# Patient Record
Sex: Female | Born: 1960 | Race: White | Hispanic: No | Marital: Married | Smoking: Never smoker
Health system: Southern US, Community
[De-identification: ages and names within clinical notes are randomized; demographics above are authoritative.]

## PROBLEM LIST (undated history)

## (undated) DIAGNOSIS — D649 Anemia, unspecified: Secondary | ICD-10-CM

## (undated) DIAGNOSIS — J302 Other seasonal allergic rhinitis: Secondary | ICD-10-CM

## (undated) DIAGNOSIS — K219 Gastro-esophageal reflux disease without esophagitis: Secondary | ICD-10-CM

## (undated) DIAGNOSIS — I10 Essential (primary) hypertension: Secondary | ICD-10-CM

## (undated) DIAGNOSIS — C4491 Basal cell carcinoma of skin, unspecified: Secondary | ICD-10-CM

## (undated) DIAGNOSIS — M5412 Radiculopathy, cervical region: Secondary | ICD-10-CM

## (undated) DIAGNOSIS — E119 Type 2 diabetes mellitus without complications: Secondary | ICD-10-CM

## (undated) DIAGNOSIS — K222 Esophageal obstruction: Secondary | ICD-10-CM

## (undated) DIAGNOSIS — K449 Diaphragmatic hernia without obstruction or gangrene: Secondary | ICD-10-CM

## (undated) DIAGNOSIS — F419 Anxiety disorder, unspecified: Secondary | ICD-10-CM

## (undated) DIAGNOSIS — L309 Dermatitis, unspecified: Secondary | ICD-10-CM

## (undated) HISTORY — PX: WRIST SURGERY: SHX841

## (undated) HISTORY — PX: ANKLE SURGERY: SHX546

## (undated) HISTORY — DX: Gastro-esophageal reflux disease without esophagitis: K21.9

## (undated) HISTORY — PX: OTHER SURGICAL HISTORY: SHX169

## (undated) HISTORY — PX: CERVICAL FUSION: SHX112

## (undated) HISTORY — DX: Type 2 diabetes mellitus without complications: E11.9

## (undated) HISTORY — PX: PARTIAL HYSTERECTOMY: SHX80

## (undated) HISTORY — DX: Basal cell carcinoma of skin, unspecified: C44.91

## (undated) HISTORY — DX: Esophageal obstruction: K22.2

## (undated) HISTORY — DX: Dermatitis, unspecified: L30.9

## (undated) HISTORY — PX: TONSILLECTOMY AND ADENOIDECTOMY: SUR1326

## (undated) HISTORY — DX: Radiculopathy, cervical region: M54.12

## (undated) HISTORY — DX: Essential (primary) hypertension: I10

## (undated) HISTORY — DX: Diaphragmatic hernia without obstruction or gangrene: K44.9

## (undated) HISTORY — DX: Other seasonal allergic rhinitis: J30.2

## (undated) HISTORY — DX: Anxiety disorder, unspecified: F41.9

## (undated) HISTORY — DX: Anemia, unspecified: D64.9

---

## 1998-08-05 ENCOUNTER — Other Ambulatory Visit: Admission: RE | Admit: 1998-08-05 | Discharge: 1998-08-05 | Payer: Self-pay | Admitting: *Deleted

## 1998-09-01 ENCOUNTER — Inpatient Hospital Stay (HOSPITAL_COMMUNITY): Admission: EM | Admit: 1998-09-01 | Discharge: 1998-09-04 | Payer: Self-pay | Admitting: Emergency Medicine

## 1998-09-01 ENCOUNTER — Encounter: Payer: Self-pay | Admitting: Emergency Medicine

## 1998-09-02 ENCOUNTER — Encounter: Payer: Self-pay | Admitting: Orthopedic Surgery

## 1998-11-14 ENCOUNTER — Encounter: Admission: RE | Admit: 1998-11-14 | Discharge: 1999-01-27 | Payer: Self-pay | Admitting: Orthopedic Surgery

## 2000-08-23 ENCOUNTER — Ambulatory Visit (HOSPITAL_BASED_OUTPATIENT_CLINIC_OR_DEPARTMENT_OTHER): Admission: RE | Admit: 2000-08-23 | Discharge: 2000-08-23 | Payer: Self-pay | Admitting: Orthopedic Surgery

## 2002-10-07 ENCOUNTER — Other Ambulatory Visit: Admission: RE | Admit: 2002-10-07 | Discharge: 2002-10-07 | Payer: Self-pay | Admitting: Obstetrics and Gynecology

## 2003-11-17 ENCOUNTER — Other Ambulatory Visit: Admission: RE | Admit: 2003-11-17 | Discharge: 2003-11-17 | Payer: Self-pay | Admitting: Obstetrics and Gynecology

## 2004-01-07 ENCOUNTER — Encounter: Admission: RE | Admit: 2004-01-07 | Discharge: 2004-01-07 | Payer: Self-pay | Admitting: Obstetrics and Gynecology

## 2005-11-27 ENCOUNTER — Ambulatory Visit: Payer: Self-pay | Admitting: Internal Medicine

## 2005-12-10 HISTORY — PX: UPPER GASTROINTESTINAL ENDOSCOPY: SHX188

## 2006-01-08 ENCOUNTER — Ambulatory Visit: Payer: Self-pay | Admitting: Internal Medicine

## 2006-06-25 ENCOUNTER — Observation Stay (HOSPITAL_COMMUNITY): Admission: AD | Admit: 2006-06-25 | Discharge: 2006-06-27 | Payer: Self-pay | Admitting: Internal Medicine

## 2006-06-25 ENCOUNTER — Ambulatory Visit: Payer: Self-pay | Admitting: Internal Medicine

## 2006-06-26 ENCOUNTER — Encounter: Payer: Self-pay | Admitting: Internal Medicine

## 2006-07-03 ENCOUNTER — Ambulatory Visit: Payer: Self-pay | Admitting: Internal Medicine

## 2006-07-08 ENCOUNTER — Ambulatory Visit: Payer: Self-pay | Admitting: Cardiovascular Disease

## 2006-07-11 ENCOUNTER — Ambulatory Visit: Payer: Self-pay | Admitting: Cardiology

## 2006-07-12 ENCOUNTER — Ambulatory Visit: Payer: Self-pay | Admitting: Cardiology

## 2006-08-01 ENCOUNTER — Ambulatory Visit: Payer: Self-pay | Admitting: Internal Medicine

## 2006-08-05 ENCOUNTER — Ambulatory Visit: Payer: Self-pay | Admitting: Gastroenterology

## 2006-08-09 ENCOUNTER — Ambulatory Visit: Payer: Self-pay | Admitting: Internal Medicine

## 2006-08-13 ENCOUNTER — Ambulatory Visit: Payer: Self-pay | Admitting: Cardiology

## 2006-08-16 ENCOUNTER — Ambulatory Visit: Payer: Self-pay

## 2006-08-19 ENCOUNTER — Inpatient Hospital Stay (HOSPITAL_BASED_OUTPATIENT_CLINIC_OR_DEPARTMENT_OTHER): Admission: RE | Admit: 2006-08-19 | Discharge: 2006-08-19 | Payer: Self-pay | Admitting: Cardiovascular Disease

## 2006-08-19 ENCOUNTER — Ambulatory Visit: Payer: Self-pay | Admitting: Cardiovascular Disease

## 2006-08-29 ENCOUNTER — Ambulatory Visit: Payer: Self-pay | Admitting: Cardiology

## 2006-09-05 ENCOUNTER — Ambulatory Visit: Payer: Self-pay | Admitting: Cardiology

## 2006-09-16 ENCOUNTER — Ambulatory Visit: Payer: Self-pay | Admitting: Cardiology

## 2006-10-08 ENCOUNTER — Ambulatory Visit: Payer: Self-pay | Admitting: Cardiology

## 2006-10-08 ENCOUNTER — Ambulatory Visit: Payer: Self-pay

## 2007-03-20 ENCOUNTER — Ambulatory Visit: Payer: Self-pay | Admitting: Internal Medicine

## 2007-03-24 ENCOUNTER — Encounter: Admission: RE | Admit: 2007-03-24 | Discharge: 2007-03-24 | Payer: Self-pay | Admitting: Internal Medicine

## 2007-05-02 ENCOUNTER — Encounter: Payer: Self-pay | Admitting: Internal Medicine

## 2007-07-29 ENCOUNTER — Encounter (INDEPENDENT_AMBULATORY_CARE_PROVIDER_SITE_OTHER): Payer: Self-pay | Admitting: *Deleted

## 2008-01-19 ENCOUNTER — Telehealth (INDEPENDENT_AMBULATORY_CARE_PROVIDER_SITE_OTHER): Payer: Self-pay | Admitting: *Deleted

## 2008-07-11 IMAGING — CR DG CHEST 1V PORT
1 series · 1 of 1 positions shown · non-contrast
Comparison: none

CLINICAL DATA: Chest pain. Asthma.
 PORTABLE CHEST -  SINGLE VIEW - 06/25/06: 
 No comparison.

[view not recorded]
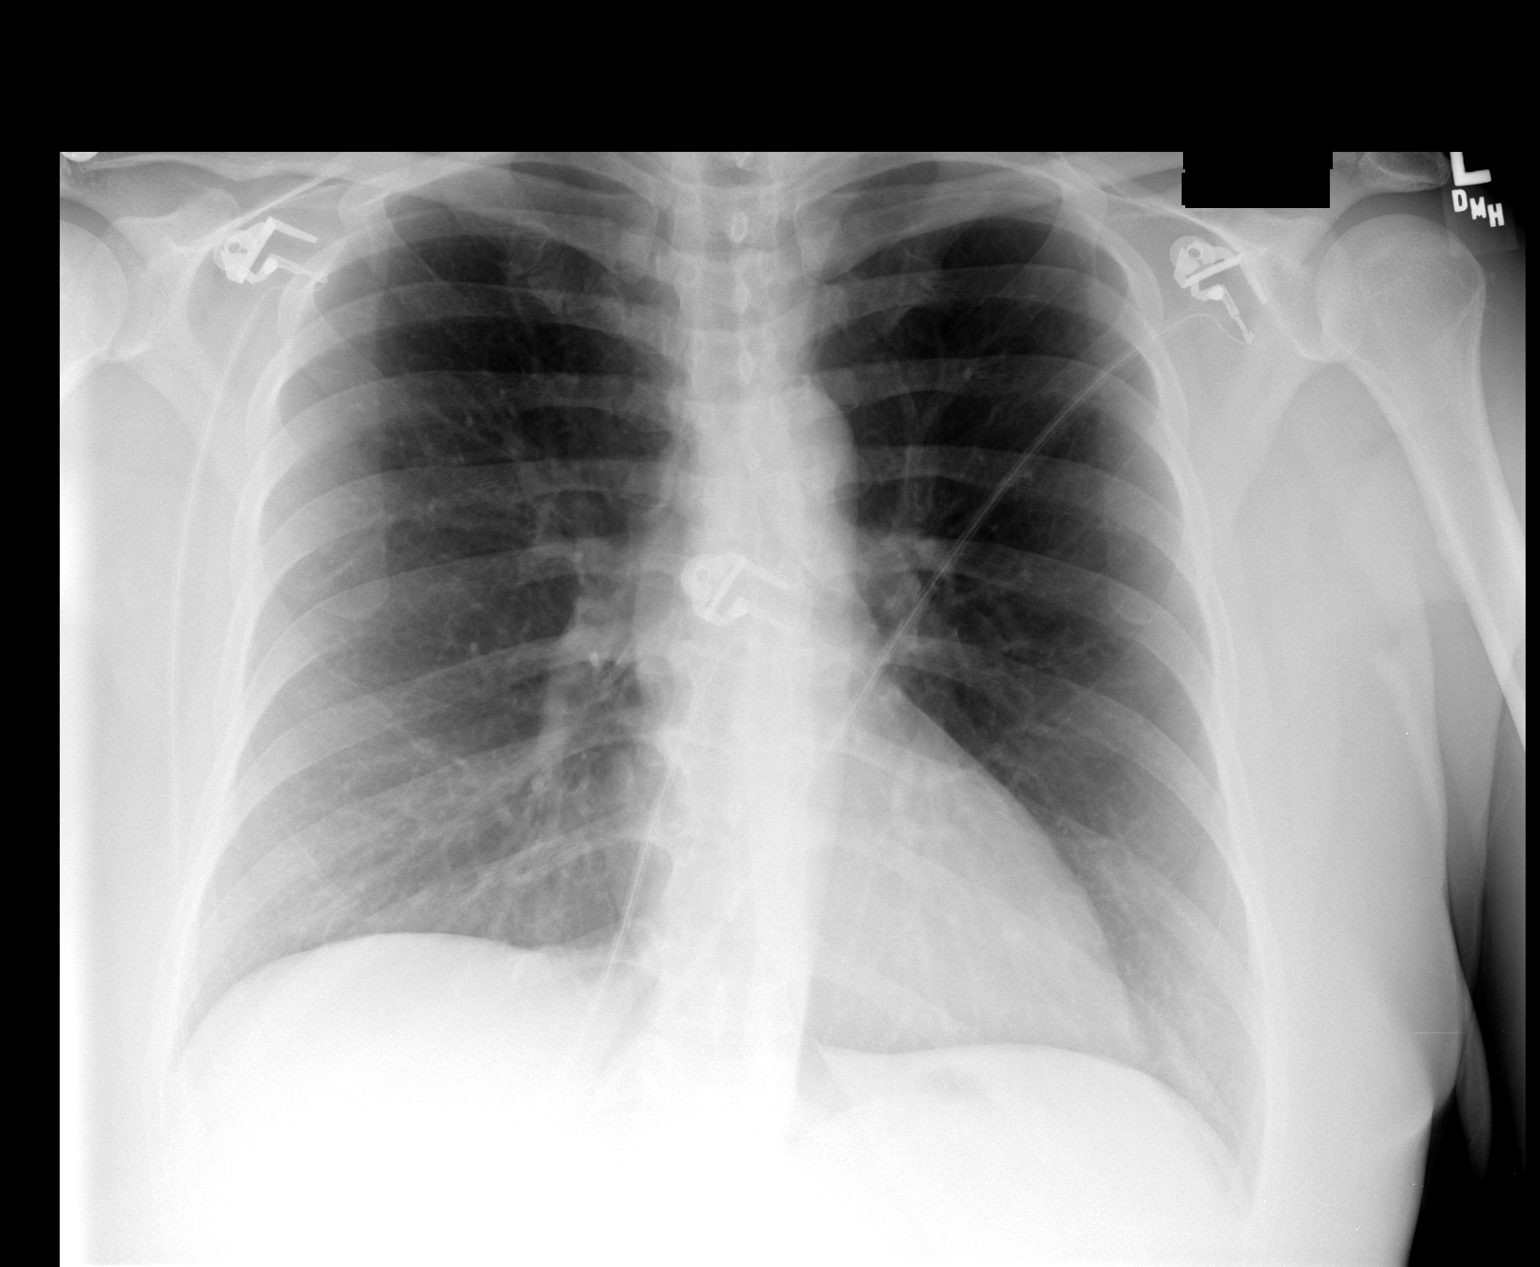

[1 of 1 positions shown; findings below may reference images not displayed]

FINDINGS: Midline trachea. The heart is at the upper limits of normal for size.  Mediastinal contours are within normal limits.  The costophrenic angles are sharp.  The lungs are clear.   No pneumothorax.  No congestive failure.
IMPRESSION: Borderline cardiomegaly.  No acute cardiopulmonary disease.

## 2008-10-01 ENCOUNTER — Encounter: Payer: Self-pay | Admitting: Internal Medicine

## 2009-01-25 ENCOUNTER — Ambulatory Visit: Payer: Self-pay | Admitting: Internal Medicine

## 2009-01-25 DIAGNOSIS — R030 Elevated blood-pressure reading, without diagnosis of hypertension: Secondary | ICD-10-CM | POA: Insufficient documentation

## 2009-01-25 DIAGNOSIS — J45909 Unspecified asthma, uncomplicated: Secondary | ICD-10-CM | POA: Insufficient documentation

## 2009-01-25 DIAGNOSIS — S82899A Other fracture of unspecified lower leg, initial encounter for closed fracture: Secondary | ICD-10-CM | POA: Insufficient documentation

## 2009-01-25 DIAGNOSIS — L259 Unspecified contact dermatitis, unspecified cause: Secondary | ICD-10-CM | POA: Insufficient documentation

## 2009-01-25 DIAGNOSIS — Z9089 Acquired absence of other organs: Secondary | ICD-10-CM | POA: Insufficient documentation

## 2009-01-25 DIAGNOSIS — G479 Sleep disorder, unspecified: Secondary | ICD-10-CM | POA: Insufficient documentation

## 2009-01-25 DIAGNOSIS — J309 Allergic rhinitis, unspecified: Secondary | ICD-10-CM | POA: Insufficient documentation

## 2009-03-29 ENCOUNTER — Ambulatory Visit: Payer: Self-pay | Admitting: Internal Medicine

## 2009-04-04 ENCOUNTER — Encounter (INDEPENDENT_AMBULATORY_CARE_PROVIDER_SITE_OTHER): Payer: Self-pay | Admitting: *Deleted

## 2009-04-04 LAB — CONVERTED CEMR LAB
ALT: 23 units/L (ref 0–35)
AST: 26 units/L (ref 0–37)
Albumin: 3.9 g/dL (ref 3.5–5.2)
Alkaline Phosphatase: 63 units/L (ref 39–117)
BUN: 8 mg/dL (ref 6–23)
Basophils Absolute: 0 10*3/uL (ref 0.0–0.1)
Basophils Relative: 0.7 % (ref 0.0–3.0)
Bilirubin, Direct: 0 mg/dL (ref 0.0–0.3)
CO2: 30 meq/L (ref 19–32)
Calcium: 9.2 mg/dL (ref 8.4–10.5)
Chloride: 107 meq/L (ref 96–112)
Cholesterol: 213 mg/dL — ABNORMAL HIGH (ref 0–200)
Creatinine, Ser: 0.7 mg/dL (ref 0.4–1.2)
Direct LDL: 111.8 mg/dL
Eosinophils Absolute: 0.1 10*3/uL (ref 0.0–0.7)
Eosinophils Relative: 2.4 % (ref 0.0–5.0)
GFR calc non Af Amer: 95.17 mL/min (ref >60)
Glucose, Bld: 89 mg/dL (ref 70–99)
HCT: 38 % (ref 36.0–46.0)
HDL: 63.6 mg/dL (ref >39.00)
Hemoglobin: 12.8 g/dL (ref 12.0–15.0)
Lymphocytes Relative: 22.6 % (ref 12.0–46.0)
Lymphs Abs: 1.4 10*3/uL (ref 0.7–4.0)
MCHC: 33.8 g/dL (ref 30.0–36.0)
MCV: 85.2 fL (ref 78.0–100.0)
Monocytes Absolute: 0.6 10*3/uL (ref 0.1–1.0)
Monocytes Relative: 10 % (ref 3.0–12.0)
Neutro Abs: 4 10*3/uL (ref 1.4–7.7)
Neutrophils Relative %: 64.3 % (ref 43.0–77.0)
Platelets: 210 10*3/uL (ref 150.0–400.0)
Potassium: 4.1 meq/L (ref 3.5–5.1)
RBC: 4.46 M/uL (ref 3.87–5.11)
RDW: 15.1 % — ABNORMAL HIGH (ref 11.5–14.6)
Sodium: 142 meq/L (ref 135–145)
TSH: 1.7 microintl units/mL (ref 0.35–5.50)
Total Bilirubin: 0.4 mg/dL (ref 0.3–1.2)
Total CHOL/HDL Ratio: 3
Total Protein: 7.1 g/dL (ref 6.0–8.3)
Triglycerides: 182 mg/dL — ABNORMAL HIGH (ref 0.0–149.0)
VLDL: 36.4 mg/dL (ref 0.0–40.0)
WBC: 6.1 10*3/uL (ref 4.5–10.5)

## 2009-08-08 ENCOUNTER — Telehealth (INDEPENDENT_AMBULATORY_CARE_PROVIDER_SITE_OTHER): Payer: Self-pay | Admitting: *Deleted

## 2009-09-23 ENCOUNTER — Encounter: Admission: RE | Admit: 2009-09-23 | Discharge: 2009-09-23 | Payer: Self-pay | Admitting: Obstetrics and Gynecology

## 2010-01-20 ENCOUNTER — Telehealth (INDEPENDENT_AMBULATORY_CARE_PROVIDER_SITE_OTHER): Payer: Self-pay | Admitting: *Deleted

## 2010-04-04 ENCOUNTER — Ambulatory Visit: Payer: Self-pay | Admitting: Internal Medicine

## 2010-04-04 DIAGNOSIS — R079 Chest pain, unspecified: Secondary | ICD-10-CM | POA: Insufficient documentation

## 2010-04-04 DIAGNOSIS — R635 Abnormal weight gain: Secondary | ICD-10-CM | POA: Insufficient documentation

## 2010-04-04 DIAGNOSIS — R9431 Abnormal electrocardiogram [ECG] [EKG]: Secondary | ICD-10-CM | POA: Insufficient documentation

## 2010-04-04 DIAGNOSIS — N946 Dysmenorrhea, unspecified: Secondary | ICD-10-CM | POA: Insufficient documentation

## 2010-04-06 ENCOUNTER — Ambulatory Visit (HOSPITAL_COMMUNITY): Admission: RE | Admit: 2010-04-06 | Discharge: 2010-04-06 | Payer: Self-pay | Admitting: Internal Medicine

## 2010-04-10 ENCOUNTER — Ambulatory Visit: Payer: Self-pay | Admitting: Internal Medicine

## 2010-04-17 LAB — CONVERTED CEMR LAB
ALT: 19 units/L (ref 0–35)
AST: 19 units/L (ref 0–37)
Albumin: 4 g/dL (ref 3.5–5.2)
Alkaline Phosphatase: 46 units/L (ref 39–117)
BUN: 12 mg/dL (ref 6–23)
Basophils Absolute: 0.1 10*3/uL (ref 0.0–0.1)
Basophils Relative: 0.7 % (ref 0.0–3.0)
Bilirubin, Direct: 0 mg/dL (ref 0.0–0.3)
CO2: 32 meq/L (ref 19–32)
Calcium: 9.3 mg/dL (ref 8.4–10.5)
Chloride: 102 meq/L (ref 96–112)
Cholesterol: 220 mg/dL — ABNORMAL HIGH (ref 0–200)
Creatinine, Ser: 0.8 mg/dL (ref 0.4–1.2)
Direct LDL: 120.3 mg/dL
Eosinophils Absolute: 0.2 10*3/uL (ref 0.0–0.7)
Eosinophils Relative: 3.2 % (ref 0.0–5.0)
Free T4: 1 ng/dL (ref 0.6–1.6)
GFR calc non Af Amer: 81.22 mL/min (ref >60)
Glucose, Bld: 88 mg/dL (ref 70–99)
HCT: 39.3 % (ref 36.0–46.0)
HDL: 69.1 mg/dL (ref >39.00)
Hemoglobin: 13.1 g/dL (ref 12.0–15.0)
Lymphocytes Relative: 29.5 % (ref 12.0–46.0)
Lymphs Abs: 2.3 10*3/uL (ref 0.7–4.0)
MCHC: 33.2 g/dL (ref 30.0–36.0)
MCV: 87.1 fL (ref 78.0–100.0)
Monocytes Absolute: 0.5 10*3/uL (ref 0.1–1.0)
Monocytes Relative: 6.4 % (ref 3.0–12.0)
Neutro Abs: 4.7 10*3/uL (ref 1.4–7.7)
Neutrophils Relative %: 60.2 % (ref 43.0–77.0)
Platelets: 225 10*3/uL (ref 150.0–400.0)
Potassium: 4 meq/L (ref 3.5–5.1)
RBC: 4.51 M/uL (ref 3.87–5.11)
RDW: 16 % — ABNORMAL HIGH (ref 11.5–14.6)
Sodium: 141 meq/L (ref 135–145)
TSH: 2.18 microintl units/mL (ref 0.35–5.50)
Total Bilirubin: 0.4 mg/dL (ref 0.3–1.2)
Total CHOL/HDL Ratio: 3
Total Protein: 7.2 g/dL (ref 6.0–8.3)
Triglycerides: 162 mg/dL — ABNORMAL HIGH (ref 0.0–149.0)
VLDL: 32.4 mg/dL (ref 0.0–40.0)
WBC: 7.9 10*3/uL (ref 4.5–10.5)

## 2010-07-21 ENCOUNTER — Ambulatory Visit (HOSPITAL_COMMUNITY): Admission: RE | Admit: 2010-07-21 | Discharge: 2010-07-21 | Payer: Self-pay | Admitting: Obstetrics & Gynecology

## 2010-10-02 ENCOUNTER — Telehealth (INDEPENDENT_AMBULATORY_CARE_PROVIDER_SITE_OTHER): Payer: Self-pay | Admitting: *Deleted

## 2010-10-18 ENCOUNTER — Ambulatory Visit: Payer: Self-pay | Admitting: Internal Medicine

## 2010-10-18 DIAGNOSIS — J019 Acute sinusitis, unspecified: Secondary | ICD-10-CM | POA: Insufficient documentation

## 2010-12-26 ENCOUNTER — Telehealth (INDEPENDENT_AMBULATORY_CARE_PROVIDER_SITE_OTHER): Payer: Self-pay | Admitting: *Deleted

## 2011-01-09 NOTE — Letter (Signed)
Summary: [facility]  [facility]   Imported By: Lanelle Bal 10/07/2008 11:40:13  _____________________________________________________________________  External Attachment:    Type:   Image     Comment:   External Document

## 2011-01-09 NOTE — Progress Notes (Signed)
Summary: refill  Phone Note Refill Request Message from:  Fax from Pharmacy on [facility] [address] fax [phone]  Refills Requested: Medication #1:  CLONAZEPAM 0.5 MG TABS 1 at bedtime as needed sleep Initial call taken by: Barb Merino,  January 20, 2010 9:00 AM    Prescriptions: CLONAZEPAM 0.5 MG TABS (CLONAZEPAM) 1 at bedtime as needed sleep  #30 x 1   Entered by:   Shonna Chock   Authorized by:   Marga Melnick MD   Signed by:   Shonna Chock on 01/20/2010   Method used:   Printed then faxed to ...       [facility] (retail)       [phone] [address]       [place], [place]  [address]       Ph: [phone]       Fax: [phone]   RxID:   [id number]

## 2011-01-09 NOTE — Assessment & Plan Note (Signed)
Summary: FOR A COLD//PH   Vital Signs:  Patient profile:   50 year old female Height:      65 inches (165.10 cm) Weight:      191 pounds (86.82 kg) BMI:     31.90 O2 Sat:      97 % on Room air Temp:     98.7 degrees F (37.06 degrees C) oral Pulse rate:   82 / minute Resp:     14 per minute BP sitting:   150 / 100  (left arm)  Vitals Entered By: Lucious Groves CMA (October 18, 2010 12:13 PM)  O2 Flow:  Room air CC: C/O cold x2 days./kb, URI symptoms Is Patient Diabetic? No Pain Assessment Patient in pain? no      Comments Patient notes that she has been having HA, fever, cough (no mucous production), congestion, SOB, feeling weak/fatigue, and severe sinus pressure. She denies chest pain. She has been trying Nyquil, Ibuprofen, and Cold Eez without relief. Patient will need work note./kb   CC:  C/O cold x2 days./kb and URI symptoms.  History of Present Illness: URI Symptoms      This is a 50 year old woman who presents with URI symptoms X 6 days ; initial symptom was ST.  The patient reports nasal congestion, purulent nasal discharge, dry cough, and earache.  Associated symptoms include low-grade fever (<100.5 degrees) & some  dyspnea, and wheezing. PMH of asthma; rescue MDI used once.  The patient also reports frontal  headache, muscle aches, and severe fatigue.  Risk factors for Strep sinusitis include bilateral facial pain & nasal discharge, tooth pain, and tender adenopathy.  Rx: Nyquil  Current Medications (verified): 1)  Clonazepam 0.5 Mg Tabs (Clonazepam) .Marland Kitchen.. 1 At Bedtime As Needed Sleep 2)  Metoprolol Succinate 25 Mg Xr24h-Tab (Metoprolol Succinate) .Marland Kitchen.. 1 Once Daily  Allergies (verified): 1)  ! Tavist Allergy (Clemastine Fumarate)  Physical Exam  General:  well-nourished,in no acute distress; alert,appropriate and cooperative throughout examination Eyes:  No corneal or conjunctival inflammation noted.  Ears:  External ear exam shows no significant lesions or  deformities.  Otoscopic examination reveals clear canals, tympanic membranes are intact bilaterally without bulging, retraction, inflammation or discharge. Hearing is grossly normal bilaterally.Some wax bilaterally Nose:  External nasal examination shows no deformity or inflammation. Nasal mucosa are dry without lesions or exudates.Slight hyponasal speech Mouth:  Oral mucosa and oropharynx without lesions or exudates.  Teeth in good repair. Lungs:  Normal respiratory effort, chest expands symmetrically. Lungs are clear to auscultation, NO  crackles or wheezes. Heart:  regular rhythm, no murmur, no gallop, no rub, no JVD, and bradycardia.   Cervical Nodes:  No lymphadenopathy noted Axillary Nodes:  No palpable lymphadenopathy   Impression & Recommendations:  Problem # 1:  SINUSITIS- ACUTE-NOS (ICD-461.9)  Her updated medication list for this problem includes:    Amoxicillin-pot Clavulanate 875-125 Mg Tabs (Amoxicillin-pot clavulanate) .Marland Kitchen... 1 every 12 hrs with a meal    Hydromet 5-1.5 Mg/21ml Syrp (Hydrocodone-homatropine) .Marland Kitchen... 1 tsp every 6 hrs as needed for cough  Problem # 2:  BRONCHITIS-ACUTE (ICD-466.0)  Her updated medication list for this problem includes:    Amoxicillin-pot Clavulanate 875-125 Mg Tabs (Amoxicillin-pot clavulanate) .Marland Kitchen... 1 every 12 hrs with a meal    Hydromet 5-1.5 Mg/44ml Syrp (Hydrocodone-homatropine) .Marland Kitchen... 1 tsp every 6 hrs as needed for cough  Complete Medication List: 1)  Clonazepam 0.5 Mg Tabs (Clonazepam) .Marland Kitchen.. 1 at bedtime as needed sleep 2)  Metoprolol Succinate  25 Mg Xr24h-tab (Metoprolol succinate) .Marland Kitchen.. 1 once daily 3)  Amoxicillin-pot Clavulanate 875-125 Mg Tabs (Amoxicillin-pot clavulanate) .Marland Kitchen.. 1 every 12 hrs with a meal 4)  Hydromet 5-1.5 Mg/73ml Syrp (Hydrocodone-homatropine) .Marland Kitchen.. 1 tsp every 6 hrs as needed for cough  Patient Instructions: 1)  Neti pot once daily two times a day until sinuses are clear. 2)  Drink as much NON dairy  fluid as you can  tolerate for the next few days. Use Albuterol MDI every 4 hrs as needed for wheezing. 3)  Recommended remaining out of work for  11/09 & 10/19/2010. Prescriptions: HYDROMET 5-1.5 MG/5ML SYRP (HYDROCODONE-HOMATROPINE) 1 tsp every 6 hrs as needed for cough  #120cc x 0   Entered and Authorized by:   Marga Melnick MD   Signed by:   Marga Melnick MD on 10/18/2010   Method used:   Print then Give to Patient   RxID:   [id number] AMOXICILLIN-POT CLAVULANATE 875-125 MG TABS (AMOXICILLIN-POT CLAVULANATE) 1 every 12 hrs with a meal  #20 x 0   Entered and Authorized by:   Marga Melnick MD   Signed by:   Marga Melnick MD on 10/18/2010   Method used:   Faxed to ...       [facility] (retail)       [phone] [address]       [place], [place]  [address]       Ph: [phone]       Fax: [phone]   RxID:   [id number]    Orders Added: 1)  Est. Patient Level III [34742]

## 2011-01-09 NOTE — Letter (Signed)
Summary: [facility]  [facility]   Imported By: Lanelle Bal 04/10/2010 11:37:23  _____________________________________________________________________  External Attachment:    Type:   Image     Comment:   External Document

## 2011-01-09 NOTE — Miscellaneous (Signed)
Summary: Orders Update   Clinical Lists Changes  Orders: Added new Service order of Venipuncture (36415) - Signed Added new Test order of TLB-Lipid Panel (80061-LIPID) - Signed Added new Test order of TLB-BMP (Basic Metabolic Panel-BMET) (80048-METABOL) - Signed Added new Test order of TLB-CBC Platelet - w/Differential (85025-CBCD) - Signed Added new Test order of TLB-Hepatic/Liver Function Pnl (80076-HEPATIC) - Signed Added new Test order of TLB-TSH (Thyroid Stimulating Hormone) (84443-TSH) - Signed 

## 2011-01-09 NOTE — Assessment & Plan Note (Signed)
Summary: CPX/YEARLY//PH   Vital Signs:  Patient profile:   50 year old female Height:      65 inches Weight:      192 pounds BMI:     32.07 Temp:     98.6 degrees F oral Pulse rate:   77 / minute Resp:     16 per minute BP sitting:   163 / 97  (left arm) Cuff size:   large  Vitals Entered By: Shonna Chock (April 04, 2010 12:59 PM)  Comments REVIEWED MED LIST, PATIENT AGREED DOSE AND INSTRUCTION CORRECT  Rechecked B/P 152/84 (patient states that she was in a rush to get to her appointment)   History of Present Illness: Morgan Andrews is here for a physical; she has had sress induced chest pain. D&C and uterine ablation scheduled 04/06/2010 by Dr Vickey Sages for severe cramping with heavy menses. CBC was WNL 1 month ago.She did not mention chest pain @ pre op @ [facility]; "I just didn't think about it(@ the time)"       The patient reports resting chest pain, occasionally with diaphoresis and palpitations. She  denies exertional chest pain, nausea, vomiting, shortness of breath, dizziness, light headedness, syncope, or indigestion.  The pain is described as intermittent and pressure-like.  The pain is located in the substernal area.  The pain radiates to the left  posterior scapula.  Episodes of chest pain last 1-5 minutes.  No triggers ; improvement with rest. Negative catheterization for similar chest pain in  2007.Hiatal hernia @ Endo 2007.  Allergies: 1)  ! Tavist Allergy (Clemastine Fumarate)  Past History:  Past Medical History: Asthma, resolved early 20s; Eczema;  Allergic Rhinitis ; Hiatal Hernia 2007 @ Endo; Elevated BP w/o diagnosis of HTN  Past Surgical History: FRACTURE, ANKLE (ICD-824.8); Ankle surgery X 5; L wrist surgery for nerve entrapment; TONSILLECTOMY AND ADENOIDECTOMY; Cath negative in  2007; Endo : HH  2007  G3 P 3;  Cervical fusion 2008, Dr Lovell Sheehan  Family History: ADOPTED; M aunt: CVA, arthritis ,osteoporosis  ; Mother: HTN, anxiety, OA; MGM bladder  cancer; MGF lung cancer. No known cardiac disease.  Social History: No HFCS in diet Occupation: Media planner Rep for [employer] Married Never Smoked Alcohol use-no Exercise: walking 2 hrs / week w/o symptoms.  Review of Systems  The patient denies fever, vision loss, decreased hearing, headaches, suspicious skin lesions, depression, unusual weight change, enlarged lymph nodes, and angioedema.         Weight up 10# over 12 months. ENT:  Denies difficulty swallowing and hoarseness. Resp:  Denies chest pain with inspiration, cough, coughing up blood, hypersomnolence, morning headaches, pleuritic, shortness of breath, sputum productive, and wheezing; Occasional excess snoring w/o apnea. GI:  Denies abdominal pain, bloody stools, dark tarry stools, loss of appetite, and vomiting blood. GU:  Denies discharge, dysuria, and hematuria.  Physical Exam  General:  well-nourished; alert,appropriate and cooperative throughout examination Head:  Normocephalic and atraumatic without obvious abnormalities. Eyes:  No corneal or conjunctival inflammation noted. Perrla. Funduscopic exam benign, without hemorrhages, exudates or papilledema. Ears:  External ear exam shows no significant lesions or deformities.  Otoscopic examination reveals clear canals, tympanic membranes are intact bilaterally without bulging, retraction, inflammation or discharge. Hearing is grossly normal bilaterally. Nose:  External nasal examination shows no deformity or inflammation. Nasal mucosa are pink and moist without lesions or exudates. Mouth:  Oral mucosa and oropharynx without lesions or exudates.  Teeth in good repair. Neck:  No deformities,  masses, or tenderness noted. Lungs:  Normal respiratory effort, chest expands symmetrically. Lungs are clear to auscultation, no crackles or wheezes. Heart:  Normal rate and regular rhythm. S1 and S2 normal without gallop, murmur, click, rub. S4. See BP; repeat BP 140/90. Abdomen:   Bowel sounds positive,abdomen soft and non-tender without masses, organomegaly or hernias noted. Genitalia:  Dr Vickey Sages Msk:  No deformity or scoliosis noted of thoracic or lumbar spine.   Pulses:  R and L carotid,radial,dorsalis pedis and posterior tibial pulses are full and equal bilaterally Extremities:  No clubbing, cyanosis, edema, or deformity noted with normal full range of motion of all joints.   Neurologic:  alert & oriented X3 and DTRs symmetrical and normal.   Skin:  Biopsy site & 2 leaf nevi  L inferior thorax;  Cervical Nodes:  No lymphadenopathy noted Axillary Nodes:  No palpable lymphadenopathy Psych:  memory intact for recent and remote, normally interactive, and good eye contact.     Impression & Recommendations:  Problem # 1:  ROUTINE GENERAL MEDICAL EXAM@HEALTH  CARE FACL (ICD-V70.0)  Orders: EKG w/ Interpretation (93000)  Problem # 2:  CHEST PAIN (ICD-786.50) Atypical rest pain , PMH of same 2007 with negative cardiac workup  Problem # 3:  ELEVATED BLOOD PRESSURE WITHOUT DIAGNOSIS OF HYPERTENSION (ICD-796.2)  Orders: EKG w/ Interpretation (93000)  Her updated medication list for this problem includes:    Metoprolol Succinate 25 Mg Xr24h-tab (Metoprolol succinate) .Marland Kitchen... 1 once daily  Problem # 4:  DYSMENORRHEA (ICD-625.3)  Problem # 5:  SLEEP DISORDER (ICD-780.50)  Problem # 6:  ASTHMA (ICD-493.90) Quiescent  Problem # 7:  NONSPECIFIC ABNORMAL ELECTROCARDIOGRAM (ICD-794.31) stable serially  Problem # 8:  WEIGHT GAIN (ICD-783.1)  Complete Medication List: 1)  Clonazepam 0.5 Mg Tabs (Clonazepam) .Marland Kitchen.. 1 at bedtime as needed sleep 2)  Benadryl Allergy/sinus 25  .Marland Kitchen.. 1 by mouth once daily as needed for allergies 3)  Metoprolol Succinate 25 Mg Xr24h-tab (Metoprolol succinate) .Marland Kitchen.. 1 once daily  Patient Instructions: 1)  Fasting labs in am: 2)  Check your Blood Pressure regularly. If it is above:135/85 ON AVERAGE  you should make an appointment. 3)   BMP; 4)  Hepatic Panel ; 5)  Lipid Panel; 6)  TSH ;freeT4; 7)  CBC w/ Diff. See Diagnoses for Codes. Take copy of records to Dr Vickey Sages 04/06/2010. Prescriptions: METOPROLOL SUCCINATE 25 MG XR24H-TAB (METOPROLOL SUCCINATE) 1 once daily  #30 x 5   Entered and Authorized by:   Marga Melnick MD   Signed by:   Marga Melnick MD on 04/04/2010   Method used:   Print then Give to Patient   RxID:   [id number] CLONAZEPAM 0.5 MG TABS (CLONAZEPAM) 1 at bedtime as needed sleep  #1 x 1   Entered and Authorized by:   Marga Melnick MD   Signed by:   Marga Melnick MD on 04/04/2010   Method used:   Print then Give to Patient   RxID:   [id number]

## 2011-01-09 NOTE — Progress Notes (Signed)
Summary: refill CLONAZEPAM//HOP  Phone Note Refill Request Message from:  Pharmacy [facility] fax [phone]  Refills Requested: Medication #1:  CLONAZEPAM 0.5 MG TABS 1 at bedtime as needed sleep Initial call taken by: Barb Merino,  August 08, 2009 9:46 AM  Follow-up for Phone Call        LAST OV CPX 03/29/09, LAST REFILL #30 X 5 ON 01/25/09 Follow-up by: Kandice Hams,  August 08, 2009 9:59 AM  Additional Follow-up for Phone Call Additional follow up Details #1::        OK X 1,RX 5 Additional Follow-up by: Marga Melnick MD,  August 08, 2009 1:30 PM    Prescriptions: CLONAZEPAM 0.5 MG TABS (CLONAZEPAM) 1 at bedtime as needed sleep  #30 x 5   Entered by:   Kandice Hams   Authorized by:   Marga Melnick MD   Signed by:   Kandice Hams on 08/09/2009   Method used:   Printed then faxed to ...       [facility] (retail)       [phone] [address]       [place], [place]  [address]       Ph: [phone]       Fax: [phone]   RxID:   [id number]

## 2011-01-09 NOTE — Letter (Signed)
Summary: Results Follow up Letter  Morgan Andrews at [facility]  [address] [place], [place] [address]   Phone: [phone]  Fax: [phone]    04/04/2009 MRN: [id number]  [facility] [address] [place], [place]  [address]  Dear Morgan Andrews,  The following are the results of your recent test(s):  Test         Result    Pap Smear:        Normal _____  Not Normal _____ Comments: ______________________________________________________ Cholesterol: LDL(Bad cholesterol):         Your goal is less than:         HDL (Good cholesterol):       Your goal is more than: Comments:  ______________________________________________________ Mammogram:        Normal _____  Not Normal _____ Comments:  ___________________________________________________________________ Hemoccult:        Normal _____  Not normal _______ Comments:    _____________________________________________________________________ Other Tests: PLEASE SEE COPY OF LABS FROM 03/29/09 AND COMMENTS    We routinely do not discuss normal results over the telephone.  If you desire a copy of the results, or you have any questions about this information we can discuss them at your next office visit.   Sincerely,

## 2011-01-09 NOTE — Progress Notes (Signed)
Summary: refill  Phone Note Refill Request Message from:  Fax from Pharmacy on October 02, 2010 10:08 AM  Refills Requested: Medication #1:  CLONAZEPAM 0.5 MG TABS 1 at bedtime as needed sleep [facility] - fax [phone]  Initial call taken by: Okey Regal [place],  October 02, 2010 10:08 AM    Prescriptions: CLONAZEPAM 0.5 MG TABS (CLONAZEPAM) 1 at bedtime as needed sleep  #90 x 0   Entered by:   Shonna Chock CMA   Authorized by:   Marga Melnick MD   Signed by:   Shonna Chock CMA on 10/02/2010   Method used:   Printed then faxed to ...       [facility] (retail)       [phone] [address]       [place], [place]  [address]       Ph: [phone]       Fax: [phone]   RxID:   [id number]

## 2011-01-09 NOTE — Assessment & Plan Note (Signed)
Summary: bp check - restart med.cbs   Vital Signs:  Patient Profile:   50 Years Old Female Weight:      196.2 pounds Temp:     99.1 degrees F oral Pulse rate:   76 / minute Resp:     18 per minute BP sitting:   158 / 92  (left arm) Cuff size:   large  Vitals Entered By: Shonna Chock (January 25, 2009 3:13 PM)                 Chief Complaint:  ELEVATED B/P X COUPLE MONTHS.  History of Present Illness: Pt is here for f/u for elevation of blood pressure ; range 120/80-142/87. Rest chest discomfort with stress but no exertional chest pain.  Hypertension History:      She complains of chest pain and visual symptoms, but denies headache, palpitations, dyspnea with exertion, orthopnea, PND, peripheral edema, neurologic problems, syncope, and side effects from treatment.  She notes no problems with any antihypertensive medication side effects.        Negative major cardiovascular risk factors include female age less than 76 years old and non-tobacco-user status.       Current Allergies (reviewed today): ! TAVIST ALLERGY (CLEMASTINE FUMARATE)  Past Medical History:    Asthma    Eczema;   Past Surgical History:    FRACTURE, ANKLE (ICD-824.8)    TONSILLECTOMY AND ADENOIDECTOMY, HX OF (ICD-V45.79)    Cath neg 2007; Endo neg 2007    Family History:    ADOPTED; FH of CVA M aunt, HTN M  Social History:    No diet(no excess salt)    Occupation: Advertising account planner    Married    Never Smoked    Alcohol use-no   Risk Factors:  Tobacco use:  never Alcohol use:  no Exercise:  no   Review of Systems  General      Complains of sleep disorder.      Mind races at  night  Eyes      Complains of blurring.      Denies double vision and vision loss-both eyes.  Resp      Denies excessive snoring, hypersomnolence, and morning headaches.      No sleep apnea as pulmonary emboli husband  GI      Complains of indigestion.      Denies abdominal pain, bloody stools, and dark  tarry stools.      [place] dysphagia. TUMS as needed with partial benefit  Neuro      Denies disturbances in coordination, numbness, poor balance, and tingling.  Psych      Denies anxiety, depression, easily angered, easily tearful, and irritability.   Physical Exam  General:     in no acute distress; alert,appropriate and cooperative throughout examination; appears tired Lungs:     Normal respiratory effort, chest expands symmetrically. Lungs are clear to auscultation, no crackles or wheezes. Heart:     Normal rate and regular rhythm. S1 and S2 normal without gallop, murmur, click, rub. S4with slurring Abdomen:     Bowel sounds positive,abdomen soft and non-tender without masses, organomegaly or hernias noted. No AAA or bruits Pulses:     R and L carotid,radial,dorsalis pedis and posterior tibial pulses are full and equal bilaterally Extremities:     No clubbing, cyanosis, edema Neurologic:     alert & oriented X3 and gait normal.   Skin:     Intact without suspicious lesions or rashes Psych:  Oriented X3, memory intact for recent and remote, and subdued.      Impression & Recommendations:  Problem # 1:  ELEVATED BLOOD PRESSURE WITHOUT DIAGNOSIS OF HYPERTENSION (ICD-796.2)  Her updated medication list for this problem includes:    Metoprolol Tartrate 50 Mg Tabs (Metoprolol tartrate) .Marland Kitchen... 1/2 two times a day if bp averages > 130/85  Orders: EKG w/ Interpretation (93000)   Problem # 2:  SLEEP DISORDER (ICD-780.50)  Problem # 3:  CHEST PAIN (ICD-786.50) non exertional Orders: EKG w/ Interpretation (93000)   Complete Medication List: 1)  Metoprolol Tartrate 50 Mg Tabs (Metoprolol tartrate) .... 1/2 two times a day if bp averages > 130/85 2)  Clonazepam 0.5 Mg Tabs (Clonazepam) .Marland Kitchen.. 1 at bedtime as needed sleep  Hypertension Assessment/Plan:      The patient's hypertensive risk group is category A: No risk factors and no target organ damage.  Today's blood  pressure is 158/92.     Patient Instructions: 1)  Check your Blood Pressure regularly. If it is above:130/85 ON AVERAGE fill & take  metoprolol 50 mg 1/2 two times a day    Prescriptions: CLONAZEPAM 0.5 MG TABS (CLONAZEPAM) 1 at bedtime as needed sleep  #30 x 5   Entered and Authorized by:   Marga Melnick MD   Signed by:   Marga Melnick MD on 01/25/2009   Method used:   Print then Give to Patient   RxID:   [id number] METOPROLOL TARTRATE 50 MG TABS (METOPROLOL TARTRATE) 1/2 two times a day IF BP AVERAGES > 130/85  #30 x 5   Entered and Authorized by:   Marga Melnick MD   Signed by:   Marga Melnick MD on 01/25/2009   Method used:   Print then Give to Patient   RxID:   [id number]

## 2011-01-09 NOTE — Progress Notes (Signed)
Summary: /reaction to bp med/LEFT MSG TO CALL  Phone Note Call from Patient Call back at [facility] Phone [phone]   Caller: Patient Reason for Call: Talk to Nurse Summary of Call: DR HOPPER// PT CALLED AND SAID SHE IS HAVING A REACTION TO BP MED DILTIAZEN 180 MG. PT CAN ONLY COME IN ON FRIDAY. PT WAS SCHEDULED FOR FRIDAY AND WAS TOLD MESSAGE WILL BE GIVEN TO TRIAGE NURSE Initial call taken by: Job Founds,  January 19, 2008 10:19 AM  Follow-up for Phone Call        left msg to call re reaction to bp med...................................................................Marland KitchenKandice Hams  January 19, 2008 12:33 PM pt coming in fri 2/13  Follow-up by: Kandice Hams,  January 21, 2008 12:22 PM

## 2011-01-09 NOTE — Assessment & Plan Note (Signed)
Summary: cpx/alr   Vital Signs:  Patient profile:   50 year old female Height:      64.5 inches Weight:      192.2 pounds BMI:     32.60 Temp:     98.8 degrees F oral Pulse rate:   80 / minute Resp:     16 per minute BP sitting:   108 / 64  (left arm) Cuff size:   large  Vitals Entered By: Shonna Chock (March 29, 2009 9:51 AM) CC: CPX WITH FASTING LABS  Comments EKG DONE ON 01/25/2009   CC:  CPX WITH FASTING LABS .  History of Present Illness: Pt here for annual physical. Pt states a lymph node on left side is swollen and sore, and noticed drainage  in the back of throat, non-productive since Sunday 03-27-2009. The painful lymph node and sore throat are  what  bother the patient the most. Pt takes BP every morning and has averaged < 130/80 off meds.   Allergies: 1)  ! Tavist Allergy (Clemastine Fumarate)  Past History:  Past Medical History:    Asthma    Eczema;     Allergic Rhinitis     Hiatal Hernia 2007 from endo; Elevated BP w/o diagnosis of HTN  Past Surgical History:    FRACTURE, ANKLE (ICD-824.8); Ankle surgery X 5; L wrist surgery for nerve entrapment;    TONSILLECTOMY AND ADENOIDECTOMY, HX OF (ICD-V45.79)    Cath neg 2007; Endo neg 2007     G:3 P:3   Family History:    ADOPTED;     FH of CVA, arthritis ,osteoporosis  M aunt,     Mother: HTN, anxiety, OA  Social History:    No diet(no excess salt)    Occupation: un-employed     Married    Never Smoked    Alcohol use-no    Exercise: walking 15 -30 minutes a day w/o symptoms.   Review of Systems General:  Complains of fatigue; denies chills and fever; sleeping better now un-employed, but not sleeping great. . Eyes:  Denies blurring, double vision, eye irritation, and itching. ENT:  Complains of difficulty swallowing, hoarseness, postnasal drainage, and sore throat; denies decreased hearing, ear discharge, earache, nasal congestion, nosebleeds, ringing in ears, and sinus pressure; Since occurance of the  post nasal drip. No frontal headache or facial pain; yellow nasal discharge. CV:  Denies chest pain or discomfort, difficulty breathing at night, difficulty breathing while lying down, palpitations, shortness of breath with exertion, and swelling of feet. Resp:  Complains of cough; denies chest discomfort, chest pain with inspiration, coughing up blood, excessive snoring, hypersomnolence, morning headaches, shortness of breath, and sputum productive. GI:  Denies abdominal pain, bloody stools, change in bowel habits, constipation, dark tarry stools, loss of appetite, nausea, and vomiting. GU:  Denies incontinence, nocturia, urinary frequency, and urinary hesitancy. MS:  Denies joint pain, joint redness, joint swelling, loss of strength, and muscle weakness. Derm:  Denies changes in color of skin, changes in nail beds, dryness, hair loss, lesion(s), poor wound healing, and rash. Neuro:  Denies difficulty with concentration, disturbances in coordination, headaches, numbness, and tingling. Psych:  Complains of anxiety; denies depression, easily angered, and easily tearful; Mild symptoms, job related. Endo:  Denies excessive hunger, excessive thirst, and excessive urination. Heme:  Denies abnormal bruising and bleeding. Allergy:  Complains of seasonal allergies and sneezing; denies hives or rash, itching eyes, and persistent infections.  Physical Exam  General:  well-nourished,in no acute distress; alert,appropriate  and cooperative throughout examination Head:  Normocephalic and atraumatic without obvious abnormalities. Eyes:  No corneal or conjunctival inflammation noted. Perrla. Funduscopic exam benign, without hemorrhages, exudates or papilledema. Vision grossly normal. Ears:  External ear exam shows no significant lesions or deformities.  Otoscopic examination reveals wax on R. Hearing is grossly normal bilaterally. Nose:  External nasal examination shows no deformity or inflammation. Nasal mucosa  are pink and moist without lesions or exudates. Slight erythema in the left nare.  Mouth:  Oral mucosa and oropharynx without lesions or exudates.   Neck:  No deformities, masses.General  tenderness noted to palpation. Breasts:  as Womens' OB/GYN Lungs:  Normal respiratory effort, chest expands symmetrically. Lungs are clear to auscultation, no crackles or wheezes. Heart:  Normal rate and regular rhythm. S1 and S2 audible. S4 Abdomen:  Bowel sounds positive,abdomen soft and non-tender without masses, organomegaly or hernias noted. Genitalia:  as per GYN Msk:  No deformity or scoliosis noted of thoracic or lumbar spine.  Pulses:  R and L carotid,radial,dorsalis pedis and posterior tibial pulses are full and equal bilaterally Extremities:  No clubbing, cyanosis, edema, or deformity noted with normal full range of motion of all joints.   Neurologic:  No cranial nerve deficits noted. Station and gait are normal. Plantar reflexes are down-going bilaterally. DTRs are symmetrical throughout. Sensory, motor and coordinative functions appear intact. Skin:  Intact without suspicious lesions or rashes Cervical Nodes:  . Painful lymph node on right side under mandible.  Axillary Nodes:  No palpable lymphadenopathy Psych:  Cognition and judgment appear intact. Alert and cooperative with normal attention span and concentration. No apparent delusions, illusions, hallucinations   Impression & Recommendations:  Problem # 1:  ROUTINE GENERAL MEDICAL EXAM@HEALTH  CARE FACL (ICD-V70.0)  Problem # 2:  SINUSITIS- ACUTE-NOS (ICD-461.9)  Her updated medication list for this problem includes:    Amoxil 500 Mg Caps (Amoxicillin) .Marland Kitchen... 1 three times a day  Problem # 3:  ELEVATED BLOOD PRESSURE WITHOUT DIAGNOSIS OF HYPERTENSION (ICD-796.2)  The following medications were removed from the medication list:    Metoprolol Tartrate 50 Mg Tabs (Metoprolol tartrate) .Marland Kitchen... 1/2 two times a day if bp averages > 130/85   Problem # 4:  SLEEP DISORDER (ICD-780.50) improved off  work  Problem # 5:  ASTHMA (ICD-493.90) Quiescent  Complete Medication List: 1)  Clonazepam 0.5 Mg Tabs (Clonazepam) .Marland Kitchen.. 1 at bedtime as needed sleep 2)  Benadryl Allergy/sinus 25  .Marland Kitchen.. 1 by mouth once daily as needed for allergies 3)  Amoxil 500 Mg Caps (Amoxicillin) .Marland Kitchen.. 1 three times a day  Patient Instructions: 1)  Neti pot once daily as needed for secretions. 2)  Drink as much fluid as you can tolerate for the next few days. 3)  Check your Blood Pressure regularly. If it is above: 130/85 ON AVERAGE  you should make an appointment. Prescriptions: AMOXIL 500 MG CAPS (AMOXICILLIN) 1 three times a day  #30 x 0   Entered and Authorized by:   Marga Melnick MD   Signed by:   Marga Melnick MD on 03/29/2009   Method used:   Print then Give to Patient   RxID:   [id number]   Appended Document: cpx/alr   Tetanus/Td Vaccine    Vaccine Type: Td    Site: left deltoid    Mfr: [email or web address]    Dose: 0.5 ml    Route: IM    Given by: Floydene Flock CMA    Exp. Date: 11/25/2010    Lot #:  [id number]   Appended Document: cpx/alr   Appended Document: Orders Update    Clinical Lists Changes  Orders: Added new Test order of TLB-BMP (Basic Metabolic Panel-BMET) (80048-METABOL) - Signed Added new Test order of TLB-Lipid Panel (80061-LIPID) - Signed Added new Test order of TLB-CBC Platelet - w/Differential (85025-CBCD) - Signed Added new Test order of TLB-Hepatic/Liver Function Pnl (80076-HEPATIC) - Signed Added new Test order of TLB-TSH (Thyroid Stimulating Hormone) (84443-TSH) - Signed      Appended Document: Orders Update    Clinical Lists Changes  Orders: Added new Test order of TLB-BMP (Basic Metabolic Panel-BMET) (80048-METABOL) - Signed Added new Test order of TLB-Lipid Panel (80061-LIPID) - Signed Added new Test order of TLB-CBC Platelet - w/Differential (85025-CBCD) - Signed Added new Test order of  TLB-Hepatic/Liver Function Pnl (80076-HEPATIC) - Signed Added new Test order of TLB-TSH (Thyroid Stimulating Hormone) (84443-TSH) - Signed

## 2011-01-11 NOTE — Progress Notes (Signed)
Summary: Rx Refill Request  Phone Note Refill Request Call back at [phone] Message from:  Fax from Pharmacy on December 26, 2010 9:31 AM  Refills Requested: Medication #1:  CLONAZEPAM 0.5 MG TABS 1 at bedtime as needed sleep   Last Refilled: 10/03/2010 [facility] Fax# [phone]   Method Requested: Fax to Local Pharmacy Next Appointment Scheduled: No Future Appts. Initial call taken by: Barnie Mort,  December 26, 2010 9:32 AM    Prescriptions: CLONAZEPAM 0.5 MG TABS (CLONAZEPAM) 1 at bedtime as needed sleep  #90 x 0   Entered by:   Shonna Chock CMA   Authorized by:   Marga Melnick MD   Signed by:   Shonna Chock CMA on 12/26/2010   Method used:   Printed then faxed to ...       [facility] (retail)       [phone] [address]       [place], [place]  [address]       Ph: [phone]       Fax: [phone]   RxID:   [id number]

## 2011-02-23 LAB — PREGNANCY, URINE: Preg Test, Ur: NEGATIVE

## 2011-02-23 LAB — CBC
HCT: 38 % (ref 36.0–46.0)
Hemoglobin: 12.8 g/dL (ref 12.0–15.0)
MCH: 29.5 pg (ref 26.0–34.0)
MCHC: 33.8 g/dL (ref 30.0–36.0)
MCV: 87.5 fL (ref 78.0–100.0)
Platelets: 196 10*3/uL (ref 150–400)
RBC: 4.35 MIL/uL (ref 3.87–5.11)
RDW: 16.4 % — ABNORMAL HIGH (ref 11.5–15.5)
WBC: 7.8 10*3/uL (ref 4.0–10.5)

## 2011-02-23 LAB — TYPE AND SCREEN
ABO/RH(D): A POS
Antibody Screen: NEGATIVE

## 2011-02-23 LAB — COMPREHENSIVE METABOLIC PANEL
ALT: 22 U/L (ref 0–35)
AST: 30 U/L (ref 0–37)
Albumin: 4 g/dL (ref 3.5–5.2)
Alkaline Phosphatase: 56 U/L (ref 39–117)
BUN: 10 mg/dL (ref 6–23)
CO2: 27 mEq/L (ref 19–32)
Calcium: 8.9 mg/dL (ref 8.4–10.5)
Chloride: 102 mEq/L (ref 96–112)
Creatinine, Ser: 0.82 mg/dL (ref 0.4–1.2)
GFR calc Af Amer: >60 mL/min (ref >60)
GFR calc non Af Amer: >60 mL/min (ref >60)
Glucose, Bld: 90 mg/dL (ref 70–99)
Potassium: 3.2 mEq/L — ABNORMAL LOW (ref 3.5–5.1)
Sodium: 139 mEq/L (ref 135–145)
Total Bilirubin: 0.8 mg/dL (ref 0.3–1.2)
Total Protein: 6.9 g/dL (ref 6.0–8.3)

## 2011-02-23 LAB — SURGICAL PCR SCREEN
MRSA, PCR: NEGATIVE
Staphylococcus aureus: NEGATIVE

## 2011-02-23 LAB — ABO/RH: ABO/RH(D): A POS

## 2011-02-27 LAB — COMPREHENSIVE METABOLIC PANEL
ALT: 18 U/L (ref 0–35)
AST: 19 U/L (ref 0–37)
Albumin: 3.9 g/dL (ref 3.5–5.2)
Alkaline Phosphatase: 51 U/L (ref 39–117)
BUN: 9 mg/dL (ref 6–23)
CO2: 26 mEq/L (ref 19–32)
Calcium: 9.2 mg/dL (ref 8.4–10.5)
Chloride: 104 mEq/L (ref 96–112)
Creatinine, Ser: 0.84 mg/dL (ref 0.4–1.2)
GFR calc Af Amer: >60 mL/min (ref >60)
GFR calc non Af Amer: >60 mL/min (ref >60)
Glucose, Bld: 94 mg/dL (ref 70–99)
Potassium: 4.3 mEq/L (ref 3.5–5.1)
Sodium: 138 mEq/L (ref 135–145)
Total Bilirubin: 0.5 mg/dL (ref 0.3–1.2)
Total Protein: 7.2 g/dL (ref 6.0–8.3)

## 2011-02-27 LAB — CBC
HCT: 38.4 % (ref 36.0–46.0)
Hemoglobin: 12.8 g/dL (ref 12.0–15.0)
MCHC: 33.4 g/dL (ref 30.0–36.0)
MCV: 87.3 fL (ref 78.0–100.0)
Platelets: 218 10*3/uL (ref 150–400)
RBC: 4.4 MIL/uL (ref 3.87–5.11)
RDW: 15.4 % (ref 11.5–15.5)
WBC: 10.2 10*3/uL (ref 4.0–10.5)

## 2011-03-05 ENCOUNTER — Other Ambulatory Visit: Payer: Self-pay | Admitting: Internal Medicine

## 2011-03-26 ENCOUNTER — Other Ambulatory Visit: Payer: Self-pay

## 2011-03-26 MED ORDER — CLONAZEPAM 0.5 MG PO TABS: 0.5000 mg | ORAL_TABLET | Freq: Every evening | ORAL | Status: DC | PRN

## 2011-03-27 ENCOUNTER — Other Ambulatory Visit: Payer: Self-pay

## 2011-03-27 NOTE — Telephone Encounter (Signed)
This was a duplicate request for clonazepam 0.5mg  to [facility] on [address], I faxed paper rx request back with an indication to send electronic in the future

## 2011-04-10 ENCOUNTER — Encounter: Payer: Self-pay | Admitting: Internal Medicine

## 2011-04-10 ENCOUNTER — Ambulatory Visit (INDEPENDENT_AMBULATORY_CARE_PROVIDER_SITE_OTHER): Payer: BC Managed Care – PPO | Admitting: Internal Medicine

## 2011-04-10 VITALS — BP 130/82 | HR 72 | Temp 98.8°F | Resp 16 | Ht 65.25 in | Wt 191.8 lb

## 2011-04-10 DIAGNOSIS — R9431 Abnormal electrocardiogram [ECG] [EKG]: Secondary | ICD-10-CM

## 2011-04-10 DIAGNOSIS — Z13220 Encounter for screening for lipoid disorders: Secondary | ICD-10-CM

## 2011-04-10 DIAGNOSIS — Z0000 Encounter for general adult medical examination without abnormal findings: Secondary | ICD-10-CM

## 2011-04-10 DIAGNOSIS — F410 Panic disorder [episodic paroxysmal anxiety] without agoraphobia: Secondary | ICD-10-CM

## 2011-04-10 LAB — LIPID PANEL
Cholesterol: 204 mg/dL — ABNORMAL HIGH (ref 0–200)
HDL: 73.6 mg/dL (ref >39.00)
Total CHOL/HDL Ratio: 3
Triglycerides: 192 mg/dL — ABNORMAL HIGH (ref 0.0–149.0)
VLDL: 38.4 mg/dL (ref 0.0–40.0)

## 2011-04-10 LAB — HEPATIC FUNCTION PANEL
ALT: 21 U/L (ref 0–35)
AST: 22 U/L (ref 0–37)
Albumin: 3.8 g/dL (ref 3.5–5.2)
Alkaline Phosphatase: 56 U/L (ref 39–117)
Bilirubin, Direct: 0 mg/dL (ref 0.0–0.3)
Total Bilirubin: 0.3 mg/dL (ref 0.3–1.2)
Total Protein: 7 g/dL (ref 6.0–8.3)

## 2011-04-10 LAB — BASIC METABOLIC PANEL
BUN: 10 mg/dL (ref 6–23)
CO2: 28 mEq/L (ref 19–32)
Calcium: 9.5 mg/dL (ref 8.4–10.5)
Chloride: 104 mEq/L (ref 96–112)
Creatinine, Ser: 0.8 mg/dL (ref 0.4–1.2)
GFR: 82.07 mL/min (ref >60.00)
Glucose, Bld: 83 mg/dL (ref 70–99)
Potassium: 4.6 mEq/L (ref 3.5–5.1)
Sodium: 141 mEq/L (ref 135–145)

## 2011-04-10 LAB — CBC WITH DIFFERENTIAL/PLATELET
Basophils Absolute: 0.1 10*3/uL (ref 0.0–0.1)
Basophils Relative: 0.7 % (ref 0.0–3.0)
Eosinophils Absolute: 0.2 10*3/uL (ref 0.0–0.7)
Eosinophils Relative: 1.6 % (ref 0.0–5.0)
HCT: 37.9 % (ref 36.0–46.0)
Hemoglobin: 12.6 g/dL (ref 12.0–15.0)
Lymphocytes Relative: 22.9 % (ref 12.0–46.0)
Lymphs Abs: 2.3 10*3/uL (ref 0.7–4.0)
MCHC: 33.4 g/dL (ref 30.0–36.0)
MCV: 88.2 fl (ref 78.0–100.0)
Monocytes Absolute: 0.7 10*3/uL (ref 0.1–1.0)
Monocytes Relative: 7.4 % (ref 3.0–12.0)
Neutro Abs: 6.7 10*3/uL (ref 1.4–7.7)
Neutrophils Relative %: 67.4 % (ref 43.0–77.0)
Platelets: 199 10*3/uL (ref 150.0–400.0)
RBC: 4.3 Mil/uL (ref 3.87–5.11)
RDW: 15.5 % — ABNORMAL HIGH (ref 11.5–14.6)
WBC: 10 10*3/uL (ref 4.5–10.5)

## 2011-04-10 LAB — LDL CHOLESTEROL, DIRECT: Direct LDL: 101.6 mg/dL

## 2011-04-10 LAB — TSH: TSH: 1.56 u[IU]/mL (ref 0.35–5.50)

## 2011-04-10 MED ORDER — CITALOPRAM HYDROBROMIDE 20 MG PO TABS: 20.0000 mg | ORAL_TABLET | Freq: Every day | ORAL | Status: DC

## 2011-04-10 NOTE — Assessment & Plan Note (Signed)
NS ST-T changes ; stable to improved. Negative cath 2007, Dr Excell Seltzer

## 2011-04-10 NOTE — Patient Instructions (Signed)
Preventive Health Care: Exercise  30-45  minutes a day, 3-4 days a week. Walking is especially valuable in preventing Osteoporosis. Eat a low-fat diet with lots of fruits and vegetables, up to 7-9 servings per day. Avoid obesity; your goal = waist less than 35 inches.Consume less than 30 grams of sugar per day from foods & drinks with High Fructose Corn Syrup as #2,3 or #4 on label.  If you have another panic attack, please collect all urine for 24 hours. Return the collection to the lab.

## 2011-04-10 NOTE — Assessment & Plan Note (Signed)
Snoring w/o apnea

## 2011-04-10 NOTE — Progress Notes (Signed)
Subjective:    Patient ID: Morgan Andrews, female    DOB: 14-Aug-1961, 50 y.o.   MRN: [id number]  HPI She is here for a physical; work has been more stressful of late.    Review of Systems Patient reports no vision/ hearing  changes, adenopathy,fever, persistant / recurrent hoarseness , swallowing issues, edema,persistant /recurrent cough, hemoptysis, dyspnea( rest/ exertional/paroxysmal nocturnal), gastrointestinal bleeding(melena, rectal bleeding), abdominal pain, significant heartburn,  bowel changes,GU symptoms(dysuria, hematuria,pyuria, incontinence) ), Gyn symptoms(abnormal  bleeding , pain),  syncope, focal weakness, memory loss,numbness & tingling, skin/hair /nail changes,abnormal bruising or bleeding.  Anxiety has been a lifelong problem problem. She describes paroxysmal panic attacks associated with diaphoresis, chest pain, and flushing. Asthma flare may accompany these. She denies associated headache or diarrhea.  She's had a weight gain of 5-10 pounds since last visit.  She's had a small lesion of the left nose which will bleed and scab; this is been present for over a year. She has an appointment with a dermatologist.     Objective:   Physical Exam Gen.: Healthy and well-nourished in appearance. Alert, appropriate and cooperative throughout exam. Head: Normocephalic without obvious abnormalities. Eyes: No corneal or conjunctival inflammation noted. Pupils equal round reactive to light and accommodation. Fundal exam is benign without hemorrhages, exudate, papilledema. Extraocular motion intact. Vision grossly normal. Ears: External  ear exam reveals no significant lesions or deformities. Canals  ; some wax bilaterally.Hearing is grossly normal bilaterally. Nose: External nasal exam reveals no deformity or inflammation. Nasal mucosa are pink and moist. No lesions or exudates noted.  Mouth: Oral mucosa and oropharynx reveal no lesions or exudates. Teeth in good repair. Neck: No  deformities, masses, or tenderness noted. Range of motion normal. Thyroid:  Firm with physiologic asynmmetry. Lungs: Normal respiratory effort; chest expands symmetrically. Lungs are clear to auscultation without rales, wheezes, or increased work of breathing. Heart: Normal rate and rhythm. Normal S1 and S2. No gallop, click, or rub. No murmur. Abdomen: Bowel sounds normal; abdomen soft and nontender. No masses, organomegaly or hernias noted. Genitalia: dr Seymour Bars.   Musculoskeletal/extremities: No deformity or scoliosis noted of  the thoracic or lumbar spine. No clubbing, cyanosis, edema, or deformity noted. Range of motion  normal .Tone & strength  normal.Joints normal. Nail health good; no onnycholysis. Vascular: Carotid, radial artery, dorsalis pedis and dorsalis posterior tibial pulses are full and equal. No bruits present. Neurologic: Alert and oriented x3. Deep tendon reflexes symmetrical and normal.         Skin: Intact without  rashes. She has 2 lightly pigmented leaf shaped nevi on her back and a round pigmented nevus of the dorsum of the right foot. There is a punctate scabbed lesion of the left nose which is not bleeding. Lymph: No cervical, axillary, or inguinal lymphadenopathy present. Psych: Mood and affect are normal. Normally interactive                                                                                        Assessment & Plan:  #1 annual physical exam  #2 anxiety disorder with possible panic attacks; rule out pheochromocytoma  #3 chronic lesion left is  with intermittent bleeding; rule out atypical cytology  #4 asthma, quiescent   #5 abnormal EKG, nonspecific ST-T wave changes which are stable. Past medical history of negative catheterization.     Plan: See orders and recommendations. She'll be asked to collect a 24-hour urine for  catecholamines  and metanephrines within next anxiety attack.  A trial of citalopram 20 mg daily will be initiated.

## 2011-04-25 ENCOUNTER — Other Ambulatory Visit: Payer: Self-pay | Admitting: Dermatology

## 2011-04-27 NOTE — Assessment & Plan Note (Signed)
[facility]                              CARDIOLOGY OFFICE NOTE   Morgan Andrews, Morgan Andrews                       MRN:          [id number]  DATE:10/08/2006                            DOB:          12/05/1961    PRIMARY CARE PHYSICIAN:  Morgan Andrews. Alwyn Ren, Andrews,[name],[name].   PRIMARY CARDIOLOGIST:  Morgan Sans. Wall, Andrews, Morgan Andrews.   REASON FOR VISIT:  Lower extremity edema.   HISTORY OF PRESENT ILLNESS:  This is my first meeting with Morgan Andrews. She  was added to the clinic schedule today for evaluation of lower extremity  edema. She is a 50 year old woman with a history of recurrent chest pain who  has undergone a fairly substantial evaluation to date including normal  cardiac catheterization, normal contrasted CT scan of the chest, normal  gastroenterology evaluation including endoscopy, and normal screening urine  catecholamines. She has been on diltiazem since August when she was  originally seen by Morgan Andrews. Originally this was dosed at 180 mg a day and  now it has been increased to 360 mg a day over the last month or so. She  tells me that she was recently visiting in [facility] (flew there and back)  between Thursday and Saturday and that while she was there walking around  she began to notice problems with swelling in her legs. She states that she  was not particularly bothered by any other limiting factors while ambulating  but that apparently the swelling became fairly significant and she had to  put her legs up due to tightness and pain. This was symmetric and since that  time has improved although she states it is not back at baseline yet. She  looked at the potential side effect profile of Cardizem and saw that edema  could be a problem and became concerned. She presented today to have this  evaluated further. Her electrocardiogram showed sinus rhythm with  nonspecific T wave changes and no marked changes in comparison to the prior  tracing. I went over  her medications and we talked some about perhaps  considering weaning back off of Cardizem to see if it made any difference  and maybe ultimately trying a different antihypertensive medication.   ALLERGIES:  TAVIST.   CURRENT MEDICATIONS:  1. Nexium 40 mg p.o. b.i.d.  2. Diltiazem CD 360 mg p.o. daily.  3. Enalapril 10 mg p.o. daily.  4. Tylenol p.r.n.   REVIEW OF SYSTEMS:  As described in the history of present illness.   PHYSICAL EXAMINATION:  VITAL SIGNS:  Weight 186, blood pressure 140/82,  heart rate 71 and regular.  GENERAL:  The patient is comfortable and in no acute distress.  NECK:  No elevated jugular venous pressure without bruits.  LUNGS:  Clear without labored breathing.  CARDIAC:  Reveals a regular rate and rhythm without loud murmur or gallop.  EXTREMITIES:  Exhibit at most trace edema around the ankles. No significant  pitting edema is noted. Distal pulses are normal with 2+ dorsalis pedis and  radials, no erythema or asymmetric swelling noted to the lower extremities.  No obvious cords are noted. The patient states that both legs feel tight.   IMPRESSION/RECOMMENDATIONS:  1. Complaints of lower extremity edema. Objectively, her exam does not      reveal any large degree of edema, perhaps trace at this point. It is      certainly possible that diltiazem may be contributing to this to some      degree. We talked about decreasing her diltiazem to 180 mg daily and      have her followup with her primary care physician to consider perhaps a      different antihypertensive going forward. She has had recent travel      including a long plane trip and although I doubt deep venous      thrombosis, I think it would be reasonable to screen with lower      extremity Doppler's which will be obtained today. Otherwise she will      continue her regular followup with primary care and can keep her      scheduled visit with Morgan Andrews.  2. Further plans to follow.     Morgan Sidle, Andrews  Electronically Signed    SGM/MedQ  DD: 10/08/2006  DT: 10/09/2006  Job #: [id number]   cc:   Morgan Andrews, Lutheran Andrews  Morgan Andrews. Alwyn Ren, Andrews,[name],[name]

## 2011-04-27 NOTE — Assessment & Plan Note (Signed)
[facility]                           GASTROENTEROLOGY OFFICE NOTE   NAME:Morgan Andrews, Morgan Andrews                       MRN:          [id number]  DATE:08/01/2006                            DOB:          June 16, 1961    OFFICE CONSULTATION NOTE:   REFERRING PHYSICIAN:  [name], MD, [name], [name]   REASON FOR CONSULTATION:  Chest pain.   HISTORY:  This is a 50 year old white female with a history of hypertension  and dyslipidemia who is referred through the courtesy of Dr. Alwyn Ren  regarding chest pain.  The patient was in her usual state of good health  until 3 months ago when she abruptly developed problems with chest pain.  She describes a slow tightening pressure in the center of the chest with  radiation into the back and occasionally into the shoulders.  No associated  nausea and vomiting.  The discomfort can occur anywhere from 5 minutes to 2  hours.  It is not obviously related to meals or activity.  She thinks it may  be related to stress.  It has not awoken her from sleep.  There has been no  associated diaphoresis or shortness of breath.  She was hospitalized at [facility] in July.  She ruled out for myocardial infarction.  She has had a  CT scan of the chest which has been negative, except for a hiatal hernia.  She has also had an adenosine Myoview study which was unremarkable except  for suggestion of some diastolic dysfunction and minimal hypertrophy.  She  does have occasional indigestion and occasional solid food dysphagia.  She  underwent an endoscopy as an 50 year old that apparently showed a hiatal  hernia.  She was having reflux symptoms at that time.  She was placed on  Nexium, which she is taking 40 mg at night.  She has been on this for 2  weeks and states that it is not helping.  Actually, as time has gone on, the  intensity and duration of her chest discomfort have increased.  No abdominal  pain.  She saw Dr. [name] on July 11, 2006.  Liver function tests and amylase  were normal.  Fasting triglycerides were normal.  Cholesterol elevated at  209.  Liver function tests were normal.  She was changed from Prinivil to  Diltiazem XR, which, again, has not seemed to help her pain.  She is due for  followup with Dr. [name] next week.   PAST MEDICAL HISTORY:  1. Hypertension.  2. Reflux disease.  3. Asthma.  4. Dyslipidemia.   PAST SURGICAL HISTORY:  1. Left wrist surgery.  2. Multiple right ankle surgeries.   ALLERGIES:  TAVIST.   CURRENT MEDICATIONS:  1. Nexium 40 mg at night.  2. Diltiazem 180 mg daily.  3. Tylenol p.r.n.   FAMILY HISTORY:  The patient is adopted, so her family history is unclear.   SOCIAL HISTORY:  Married with 3 children.  Accompanied today by her daughter  and her daughter's friend.  Lives with her husband and children.  Works as a  Veterinary surgeon  for [employer].  Does not smoke or use alcohol.   REVIEW OF SYSTEMS:  Per diagnostic evaluation form.   PHYSICAL EXAMINATION:  GENERAL:  A well-appearing female in no acute  distress.  VITAL SIGNS:  Blood pressure of 110/70, heart rate 72.  Weight is 185.2  pounds.  She is 5 feet, 4 inches in height.  HEENT:  Sclerae are anicteric.  Conjunctivae are pink.  Oral mucosa is  intact.  No adenopathy.  LUNGS:  Clear.  HEART:  Regular.  ABDOMEN:  Soft without tenderness, mass or hernia.  Good bowel sounds heard.  No reproducible pain with palpation over the sternum.  EXTREMITIES:  Without edema.   IMPRESSION:  Recurrent problems with chest pain, cause uncertain.  Some  concern about coronary artery spasm.  She does have a history of reflux  disease.  Those symptoms have not improved on proton pump inhibitor therapy  to date.  Not clear by history that this is obviously a GI disorder.  Further evaluation required.   RECOMMENDATIONS:  1. Increase proton pump inhibitor therapy to b.i.d. empirically.  2. Schedule abdominal ultrasound to rule  out gallstones.  3. Schedule upper endoscopy to exclude esophageal mucosal abnormalities as      a possible cause for symptoms.  4. If no obvious cause found with the above testing, and the patient does      not respond to prolonged high-dose acid suppression, and her cardiology      work-up has been completed, then consider esophageal manometry to      exclude motility disorder.  I have discussed this in detail with the      patient who understands and agrees to proceed along those lines.                                   Wilhemina Bonito. Eda Keys., MD   JNP/MedQ  DD:  08/01/2006  DT:  08/02/2006  Job #:  [id number]   cc:   [name], MD, [name], [name]  [name], MD, [facility]

## 2011-04-27 NOTE — Op Note (Signed)
[place]. [facility]  Patient:    Morgan Andrews, Morgan Andrews                       MRN: [id number] Proc. Date: 08/23/00 Adm. Date:  [id number] Disc. Date: [id number] Attending:  Colbert Ewing                           Operative Report  PREOPERATIVE DIAGNOSIS:  Post traumatic tarsal tunnel syndrome right ankle.  POSTOPERATIVE DIAGNOSIS:  Post traumatic tarsal tunnel syndrome right ankle.  PROCEDURE:  Right tarsal tunnel release.  SURGEON:  Loreta Ave, M.D.  ASSISTANT:  Arlys John D. Petrarca, P.A.-C.  ANESTHESIA:  General.  ESTIMATED BLOOD LOSS:  Minimal.  TOURNIQUET TIME:  45 minutes.  SPECIMENS:  None.  CULTURES:  None.  COMPLICATIONS:  None.  DRESSING:  Sterile compressive with cam walker.  PROCEDURE:  The patient was brought to the operating room and placed on the operating table in the supine position.  After adequate anesthesia had been obtained, the tourniquet was applied about the upper aspect of the right thigh.  Prepped and draped in the usual sterile fashion.  exsanguinated with elevation and Esmarch.  Tourniquet inflated to 350 mmHg.  Curved incision along the tarsal tunnel from above the medial malleolus down to the short flexor origin.  Skin and subcutaneous tissue divided, maintaining skin flaps as thick as possible.  Tarsal tunnel identified, released in its entirety from the fascia at the distal leg all the way down past the fascia underneath the short flexor to the level of the bifurcation of the posterior tibial nerve into the medial and lateral plantar branches.  Hypertrophic excessive veins ligated and divided.  Arterial and main venous structures protected. Posterior tibial nerve identified in its entirety and decompressed in its entirety throughout the entire tarsal tunnel.  There was moderate constriction throughout with 2 or 3 very dense bands in the mid portion as well as a very thickened dense fascia under the margin of  the short flexor.  The plantar branch and heel branches all identified and completely decompressed.  Wound was irrigated.  Skin was closed with interrupted nylon.  Margin of the wound injected with Marcaine.  Sterile compressive dressing applied.  Tourniquet deflated and removed.  Anesthesia reversed and brought to recovery room. Tolerated surgery well with no complications. DD:  08/23/00 TD:  08/26/00 Job: [id number]

## 2011-04-27 NOTE — Assessment & Plan Note (Signed)
[facility]                        [facility] OFFICE NOTE   NAME:[name], [name]                       MRN:          [id number]  DATE:03/20/2007                            DOB:          [birth year]    Morgan Andrews is seen acutely for pain and numbness in the left upper extremity.  She woke up with the acute symptoms March 18, 2007.  It was manifested  initially as pain at the base of the neck with extension into the  shoulder & into her left hand,  associated with paresthesias of the left  thumb, and loss of strength in the hand.   There is no history of neck injury.   PAST MEDICAL HISTORY:  Please see the history and physical for  hospitalization June 25, 2006 for atypical chest pain.   EXAMINATION:  Weight is stable.  Pulse is 84.  Respiratory rate 16.  Blood pressure 142/100.  She has pain with neck rotation or flexion.  Deep tendon reflexes are  hyperactive, except slightly reduced @ the left biceps.  There is  decreased strength to opposition of the left hand muscles.   Clinically, she has a cervical radiculopathy, which appears to mainly  involve C5, C6.   MRI of the cervical spine will be pursued.   She will be placed on a short course of prednisone, and be given  gabapentin and pain medication.     [name], MD,[name],[name]  Electronically Signed    WFH/MedQ  DD: 03/20/2007  DT: 03/20/2007  Job #: [id number]

## 2011-04-27 NOTE — Assessment & Plan Note (Signed)
[facility]                              CARDIOLOGY OFFICE NOTE   NAME:Champagne, Morgan Andrews                       MRN:          [id number]  DATE:07/11/2006                            DOB:          08/16/1961    CARDIOLOGY CONSULTATION:  I was asked by Dr. Alwyn Ren to evaluate Maris Abascal, a delightful 50 year old  married white female who has been having severe chest pain off and on for  the last 4-6 weeks.   She was actually admitted to [facility] in May and ruled out for  myocardial infarction.  The chest CT at that time was negative except for a  hiatal hernia.  She had an adenosine Myoview, EF of 63%, with no perfusion  abnormalities.  She had normal wall function.  In addition, she had a 2-D  echocardiogram which was essentially normal.  EF was 60-65%.  There was  suggestion of some diastolic dysfunction and some minimal hypertrophy.  She  does have a history of hypertension.   Of note, she had nonfasting lipids at that time which showed a triglyceride  level of 531, total cholesterol 199, HDL 49, LDL was not calculated.   Discomfort is described as a twisting or a pressure in the center of her  chest.  It goes through to her back.  She has had a history of reflux and  knows what this feels like and really has not had any symptoms like that.   She denies any history of peptic ulcer disease.  She has had no hematemesis  or hemoptysis.  She denies any significant dyspnea on exertion or chest  discomfort with exertion.   PAST MEDICAL HISTORY:  She is intolerant of TAVIST-D.   Her current medications are:  1.  Prinivil 20 mg a day.  2.  Neurontin one daily for headache.  3.  Prilosec over-the-counter, which she rarely takes.   She does not smoke, drink or use recreational drugs.  She does use one  caffeinated beverage a day.   When she does her usual walking and exertion, she has no chest discomfort or  shortness of breath.   Her surgical  history:  She has had 5 right ankle surgeries, 1 left wrist  surgery.   FAMILY HISTORY:  She is adopted.  She has a half-sister who is 16, who has  no known heart problems.   SOCIAL HISTORY:  She is a Veterinary surgeon.  She has 3 children and is married.   REVIEW OF SYSTEMS:  Other than the HPI, totally negative and  noncontributory.   PHYSICAL EXAMINATION:  GENERAL:  She is very pleasant.  VITAL SIGNS:  She is 5 feet 4-1/2 inches and weighs 177 pounds.  Her blood  pressure is 120/82, pulse 79 and regular.  HEENT:  Normocephalic, atraumatic.  PERRLA.  Extraocular movements intact.  Sclerae are clear.  Facial symmetry is normal.  Dentition is satisfactory.  NECK:  Carotids are full without bruits.  There is no JVD.  Thyroid is not  enlarged.  Trachea is midline.  LUNGS:  Clear.  CARDIAC:  Heart reveals a regular rate and rhythm with no obvious murmur,  rub, or gallop.  S2 is physiologically split.  ABDOMEN:  Soft with good bowel sounds.  There is no midline bruit.  There is  no hepatomegaly, and bowel sounds are present.  EXTREMITIES:  No cyanosis, clubbing or edema.  Pulses are intact.   Electrocardiogram today shows normal sinus rhythm with ST segment changes  inferiorly and laterally.  These were seen several weeks ago with an EKG by  Dr. Alwyn Ren.   ASSESSMENT:  Chest discomfort that is worrisome for coronary disease but  also could be esophageal spasm.  I am also worried about the remote  possibility of some low-grade pancreatitis with the triglycerides being as  high as they were.  Please note, these were nonfasting.   PLAN:  1.  Fasting lipids and LFTs as well as an amylase.  She has eaten today.  2.  Change her Prinivil to diltiazem XR 180 mg daily for possible treatment      of esophageal spasm and possibly coronary spasm as well as her blood      pressure.  3.  Proton pump inhibitor daily for a month.   I will have her return in 3-4 weeks to see how she feels.  Hopefully, she   will feel better.                               Thomas C. [name], MD, [facility]    TCW/MedQ  DD:  07/11/2006  DT:  07/11/2006  Job #:  [id number]   cc:   [name], MD, [name]

## 2011-04-27 NOTE — Assessment & Plan Note (Signed)
[facility]                              CARDIOLOGY OFFICE NOTE   NAME:Palmisano, SHAKERRA RED                       MRN:          [id number]  DATE:08/13/2006                            DOB:          [birth year]    HISTORY:  Miss Turi returns today for further management of her chest  pressure and discomfort.  Please see my note from 07/11/2006.   The diltiazem has not helped at 180 q. day.  She has had further evaluation  with Dr. Yancey Flemings including abdominal ultrasound and an endoscopy which  was normal.  He seemed to suggest to her that esophageal manometry may not  be useful.  Her fasting lipid showed a normalization of her triglycerides.   She had quite a bit of pain, non-exertional related, over the weekend.   He also doubled her Nexium to 40 b.i.d. which she is currently taking which  has not helped as much either.   PHYSICAL EXAMINATION:  Her blood pressure today is 150/92, her pulse is 80  and regular.  Weight is 185.  Carotids are full without JVD.  There are no  bruits.  LUNGS:  Clear.  HEART:  Reveals a regular rate and rhythm.  EXTREMITIES:  Without clubbing, cyanosis, or edema.  Pulses are intact.   EKG shows sinus rhythm with some ST segment changes inferolaterally which  are unchanged from before.   At this point Miss Durrett has had a negative stress nuclear study, ruled out  for myocardial infarction, a negative CT including looking for a pulmonary  embolus, and negative GI evaluation.  Because her blood pressure is up today  I am going to double her diltiazem to 360 at bedtime.  If this has not  caused a substantial improvement in her discomfort in the next several days,  I will set her up for a JV outpatient cath.  She is quite concerned and we  want to make sure that there are no significant coronary issues.                               Thomas C. [name], MD, [facility]    TCW/MedQ  DD:  08/13/2006  DT:  08/14/2006  Job #:   [id number]   cc:   [name], MD,[name],[name]  Wilhemina Bonito. Eda Keys., MD

## 2011-04-27 NOTE — H&P (Signed)
NAMESACRED, ROA                ACCOUNT NO.:  [id number]   MEDICAL RECORD NO.:  [id number]          PATIENT TYPE:  OBV   LOCATION:  3734                         FACILITY:  [facility]   PHYSICIAN:  [name], M.D. [facility] OF BIRTH:  Jul 01, 1961   DATE OF ADMISSION:  06/25/2006  DATE OF DISCHARGE:                                HISTORY & PHYSICAL   Morgan Andrews is a 50 year old white female admitted with atypical chest pain  present for 2 weeks.  This is described as a constant pressure over 1-1/2 to  2 weeks in the substernal area.  Occasionally it will radiate to her back.  This has been associated with diaphoresis but not nausea.  Intermittently  she noted tachycardia. She has also had intermittent shortness of breath but  no wheezing despite a history of asthma.  She specifically denies dysphagia,  melena, rectal bleeding, or abdominal pain.  Her last chest pain was the  evening of July 16 from 9 to 9:30.   She walks at work over 30 minutes 3 times a week without cardiopulmonary  symptoms.   She is on no specific diet.   She is adopted but has obtained information from the maternal side of the  family.  Her birth mother had exercise-induced bronchospasm and was a  smoker.  Her natural mother's sister had hypertension and strokes and  rheumatoid arthritis.   She has never smoked.  She does not drink.   She has had a fracture of the ankle and has had 5 surgeries on that ankle  during the period of 1999 to 2002.  She has also had tonsillectomy.  She is  gravida 3, para 3.  She was hospitalized approximately 15 times prior to  1993, but not since, related to her asthma.  She had surgery on the left  wrist for de Quervain syndrome.  She also has eczema.   REVIEW OF SYSTEMS:  Reveals some positional numbness of arms.   She has had some pressure in her ears with the chest pain.   She has had a diffuse headache at night which is dull to throbbing which  responds to  Tylenol.   She was scheduled for a stress Cardiolite in February of this year because  of nonspecific ST-T wave changes found at physical on January 08, 2006.  She  cancelled this as she has a $2500 deductible, and the insurance would not  pay for the stress test despite the abnormal EKG.   She occasionally has ankle pain from a previous fracture.   She has had no flushing or diarrhea with the present symptoms.   In January, she was treated for rhinosinusitis.   The remainder of Review of Systems was completed and is negative.   PHYSICAL EXAMINATION:  GENERAL:  She is in no acute distress at this time.  There are mild A-V crossing changes.  VITAL SIGNS:  Pulse is 52, respiratory rate 18, blood pressure 140/94.  HEENT:  Otolaryngologic exam was normal.  Nares are patent with good air  flow.  LYMPHADENOPATHY:  She has no lymphadenopathy of  head, neck, or axillae.  NECK: Thyroid is normal to palpation.  CHEST:  Clear to auscultation with no increased work of breathing and no  rales or splinting.  CARDIAC: An S4 was noted with slurring.  She has no murmurs or gallops.  All  pulses are intact.  There is no edema.  ABDOMEN: Striae noted over the abdomen.  EXTREMITIES:  Homan's sign is negative.  As stated, she has no edema.  BREASTS/PELVIC/RECTAL: Exams deferred.  NEUROLOGIC/PSYCHIATRIC:  Exam was negative.  MUSCULOSKELETAL: Exam reveals operative scars at the right ankle.  SKIN: She has a 4 x 6 mm nevus in the left lower back area. She also has two  leaf-like nevi in this area.  There is also a leaf nevus over the dorsum of  the right foot.   EKG reveals progressive ST-T wave changes compared to EKG on January 30.   She will be admitted for observation with atypical chest pain present for  approximately 2 weeks and associated with EKG changes.   She had elevation of triglycerides at 182 in January, and this will be  rechecked fasting July 18.  At that time, her LDL was 89, and HDL  was 66.   Stress Cardiolite will be completed in the hospital.   Metoprolol will be added for the hypertension.  Potassium will be rechecked  as it was 3.4 in January; she is on hydrochlorothiazide.   Because of the leaf nevi and hypertension, evaluation for pheochromocytoma  will be pursued if hypertension fails to respond to these interventions.      [name], M.D. [facility]  Electronically Signed     WFH/MEDQ  D:  06/25/2006  T:  06/25/2006  Job:  [id number]

## 2011-04-27 NOTE — Cardiovascular Report (Signed)
Morgan Andrews, Morgan Andrews                ACCOUNT NO.:  [id number]   MEDICAL RECORD NO.:  [id number]          PATIENT TYPE:  OIB   LOCATION:  1961                         FACILITY:  [facility]   PHYSICIAN:  Veverly Fells. Excell Seltzer, MD  DATE OF BIRTH:  July 19, 1961   DATE OF PROCEDURE:  08/19/2006  DATE OF DISCHARGE:                              CARDIAC CATHETERIZATION   PROCEDURES:  Left heart catheterization, selective coronary angiogram, left  ventricular angiogram.   INDICATIONS FOR PROCEDURE:  Morgan Andrews is a 50 year old woman with  hypertension and dyslipidemia, who presented with recurrent chest pain. She  has been tried on pro-ton pump inhibitors and has had a GI and a non-  invasive cardiac workup that have been unrevealing. She has had recurrent  chest pain and was therefore, referred for cardiac catheterization to rule  out significant obstructive coronary artery disease.   ACCESS:  Right femoral artery.   COMPLICATIONS:  None.   DESCRIPTION OF PROCEDURE:  Risks and indications were explained to the  patient. Informed consent was obtained. The right groin was prepped and  draped in normal sterile fashion. Anesthetized with 1% Lidocaine. Using the  modified Seldinger technique, arterial access was gained into the right  common femoral artery and a 4 French sheath was  placed. Diagnostic  catheters included a 4 [place] JL-4, 3-DRC, and angle pigtail catheters. All  catheter exchanges were performed over a guidewire. At the conclusion of the  case, the sheath was pulled and manual pressure was used for hemostasis.   FINDINGS:  Aortic pressure 143/88 with a mean of 111.   Left ventricular pressure 146/7 with an end-diastolic pressure of 13.   The left main stem is angiographically normal. It bifurcates into the LAD  and left circumflex.   The LAD is a medium to large diameter vessel. It courses down to the left  ventricular apex. It gives off 2 medium caliber diagonal branches. The LAD  system is angiographically normal.   The left circumflex leaves the atrioventricular groove early in its course.  It gives off 1 large obtuse marginal branch that trifurcates distally and  supplies much of the posterolateral wall. The left circumflex system is  angiographically normal.   The right coronary artery is dominant. It gives off 1 small diameter RV  marginal branch. Distally, it bifurcates into the posterior AV segment and  PDA. The posterior AV segment is relatively small and gives off 1 small  posterolateral branch as well as the AV nodal artery. The right coronary  artery is angiographically normal.   A left ventriculogram was performed in the RAO 30% projection. The left  ventricular systolic function is normal with an estimated left ventricular  ejection fraction of 55%.   IMPRESSION:  1. Angiographically normal coronary arteries.  2. Normal left ventricular function.   The findings were reviewed with Morgan Andrews and she was reassured regarding  her cardiac status. She will followup with Dr. [name] and Dr. Alwyn Ren for her  ongoing care.      Veverly Fells. Excell Seltzer, MD  Electronically Signed     MDC/MEDQ  D:  08/19/2006  T:  08/19/2006  Job:  [id number]   cc:   Thomas C. [name], MD, [facility]  [name], MD,[name],[name]

## 2011-04-27 NOTE — Assessment & Plan Note (Signed)
[facility]                          [facility] OFFICE NOTE   NAME:Morgan Andrews, Morgan Andrews                       MRN:          [id number]  DATE:07/03/2006                            DOB:          1961/01/28    [name] was hospitalized June 25, 2006 until June 27, 2006 with severe  chest pain.  Her EKGs had shown progressive nonspecific ST changes on serial  EKGs.  Normal negative studies during the hospitalization included CBC and  differential, D-dimer, CPK-MB x4 and troponins x4.   Her lipid profile did reveal a triglyceride level of 531; total cholesterol  was 199.  The LDL could not be calculated because of the high triglycerides.  Her HDL was minimally reduced at 49.  Despite the hypertriglyceridemia, she  has no clinical evidence of any pancreatitis.  Chest x-ray revealed no  active disease.  A 2D echocardiogram revealed no significant valvular  issues.  A rest and pharmacological stress test on July 19 revealed normal  wall motion with an ejection fraction of 63% with no perfusion defects.  Clinically, she had shortness of breath and flushing but no chest pain.   She was seen in followup on July 25 with increasing pain and frequency over  three days, despite taking Prilosec, as recommended.   The lab studies were reviewed with her as well as the clinical studies.  The  only abnormality other than triglycerides had been a potassium of 3.3 at the  time of admission; at the time of discharge, it was 4.2.  She had been on  hydrochlorothiazide but was changed to Prinivil 20 mg daily.   The hypertriglyceridemia was to be addressed with carbohydrate-restricted  diet with follow-up lipids in three months.   Because of the persistent pain, a CT of the chest and abdomen was  recommended.  [facility] declined completion of the CT of the  abdomen, despite my discussion of the case with Dr. Carman Ching [phone]).   The CT of the  chest with incidental tangential assessment of the upper  abdomen was negative except for a small hiatal hernia.   I am perplexed by the unrelenting nature of her pain.  It is less likely to  be cardiac, in view of the duration and intensity.  At this point, a more  likely scenario would be something such as a nutcracker esophagus, in view  of the severe pain  with back radiation.  I am still perplexed by the  significant ST-T wave changes.  I will have the cardiologist see her with  this date to see if they feel catheterization is indicated.  If they do not,  I would proceed with a GI consult to include positive upper endoscopy.   When I attempted to obtain authorization for the CAT scan of the abdomen,  Dr. Grant Ruts recommended a barium swallow.  Ms. Wisor has no dysphagia or  swallowing dysfunction.  I expressed to Dr. Grant Ruts that I did not think this  would be a standard, as the barium swallow would probably miss 20% of the  pathologic lesions, direct  endoscopic evaluation would be a more appropriate  approach.   The results of the CT scan were relayed to the patient on July 31, and she  was notified that we would be contacting cardiology and/or GI for followup.                                   [name], MD, [facility]   WFH/MedQ  DD:  07/09/2006  DT:  07/09/2006  Job #:  [id number]

## 2011-04-27 NOTE — Assessment & Plan Note (Signed)
[facility]                              CARDIOLOGY OFFICE NOTE   NAME:Markes, MIRIYA CLOER                       MRN:          [id number]  DATE:08/29/2006                            DOB:          03/26/1961    PRIMARY CARDIOLOGIST:  Dr. Valera Castle   PRIMARY CARE PHYSICIAN:  Dr. Marga Melnick   HISTORY OF PRESENT ILLNESS:  Ms. Doswell is a very pleasant 50 year old  female patient followed by Dr. [name] with a history of chest discomfort.  She  was recently referred for cardiac catheterization after multiple tests had  come back negative.  This was done on August 19, 2006 by Dr. Excell Seltzer and  showed angiographically normal coronary arteries and normal LV function.  Prior to this she has had an abdominal ultrasound and an endoscopy that were  both normal.  She also had a negative chest CT looking for pulmonary  embolus.  She had an echocardiogram performed that showed normal LV  function.  She also had a nuclear study that showed no perfusion  abnormalities.  She presents to the office today for post-catherization  followup.  She continues to have chest pain.  These are episodes that occur  at any time.  They are not necessarily associated with exertion.  She does  notice substernal pressure.  She does get short of breath.  She does have  diaphoresis.  She also noted some lightheadedness as well as some flushing.  She is unsure whether or not she has ever had any palpitations with this but  thinks it could be possibility.   CURRENT MEDICATIONS:  1. Nexium 40 mg b.i.d.  2. Diltiazem 360 mg daily.   ALLERGIES:  TAVIST.   PHYSICAL EXAMINATION:  GENERAL:  She is a well-nourished, well-developed  female in no distress.  VITAL SIGNS:  Blood pressure lying is 136/91 with a pulse of 74, sitting  154/99 with a pulse of 77, standing 149/99 with a pulse of 82, after 2  minutes 170/102 with a pulse of 84, after 5 minutes 172/104 with a pulse of  81.  HEAD:   Normocephalic, atraumatic.  EYES:  PERRLA.  EOMI.  Sclerae clear.  NECK:  Without JVD.  CARDIAC:  Normal S1/S2.  Regular rate and rhythm.  LUNGS:  Clear to auscultation bilaterally.  ABDOMEN:  Soft, nontender.  EXTREMITIES:  Without edema.  Calves are soft and nontender.  Right groin  without hematomas or bruits.  NEUROLOGIC:  She is alert and oriented x3.  Cranial nerves II-XII are  grossly intact.   IMPRESSION:  1. Chest pain.  2. Episodes of lightheadedness and diaphoresis.  3. Uncontrolled hypertension.  4. Normal coronary arteries by catheterization.  5. Good left ventricular function.  6. Negative chest CT for pulmonary embolism recently.  7. Recent negative gastrointestinal workup.   PLAN:  The patient continues to have chest pain.  She has had multiple tests  performed to date and they have all been negative.  She does have some  symptoms of flushing and diaphoresis and her blood pressure seems to be  difficult to  control.  We will go ahead and start her on enalapril 5 mg a  day.  I have also recommended we collect a 24-hour urine for catecholamines,  metanephrines and VMA.  She had a negative abdominal ultrasound and there  was no mention of any masses in her adrenals but we will go ahead and check  the urine to completely rule this out.  It could be that better control of  her blood pressure may alleviate some of her symptoms.  We will check a BMET  in a week to follow up on her renal function and potassium.  She will also  need a TSH in a week as she was requesting this.  I will have her follow up  with Dr. [name] in the next 4 weeks.                                  Tereso Newcomer, PA-C                             Jonelle Sidle, MD   SW/MedQ  DD:  08/29/2006  DT:  08/31/2006  Job #:  [id number]   cc:   [name], MD,[name],[name]

## 2011-04-27 NOTE — Procedures (Signed)
[facility]                            ABDOMINAL ULTRASOUND REPORT   NAME:Morgan Andrews, Morgan Andrews                       MRN:          [id number]  DATE:08/05/2006                            DOB:          06/28/1961    ACCESSION NUMBER:  [id number].   ORDERING PHYSICIAN:  Wilhemina Bonito. Marina Goodell, M.D.   READING PHYSICIAN:  Malcom T. Russella Dar, MD, [facility]   PROCEDURE:  Multiplanar abdominal ultrasound imaging was performed in the  upright, supine, right and left lateral decubitus positions.   RESULTS:  Abdominal aorta measures 1.5 cm in maximal diameter, and is  normal.  The IVC is patent.   The body of the pancreas as normal.  The head and tail of the pancreas are  not imaged.   Gallbladder is well distended, thin walled, with the wall thickness  measuring 2.0 mm, with no pericholecystic fluid or intraluminal echogenic  foci to suggest gallstone disease.   The common bile duct measures 4.1 mm in maximal diameter without evidence of  intraluminal foci.   In the liver, there is a moderate increase in echodensity diffusely with  borderline hepatomegaly.  The remainder of the liver appears normal without  evidence of parenchymal lesion, ductal dilatation or vascular abnormality.   Kidneys are normal in appearance.  The right kidney measures 9.9 cm, the  left kidney 9.5 cm.   Spleen is normal in size, measuring 9.5 cm without parenchymal lesion.   IMPRESSION:  1. Moderately increased hepatic echodensity with borderline hepatomegaly.  2. Limited pancreatic imaging.                                   Venita Lick. Pleas Koch., MD, [facility]   MTS/MedQ  DD:  08/06/2006  DT:  08/06/2006  Job #:  [id number]   cc:   Wilhemina Bonito. Eda Keys., MD

## 2011-05-06 LAB — METANEPHRINES, URINE, 24 HOUR
Metaneph Total, Ur: 488 ug/(24.h) (ref 182–739)
Metanephrines, Ur: 167 ug/(24.h) (ref 58–203)
Normetanephrine, 24H Ur: 321 ug/(24.h) (ref 88–649)

## 2011-05-10 LAB — CATECHOLAMINES, FRACTIONATED, URINE, 24 HOUR
Calculated Total (E+NE): 65 mcg/24 h (ref 26–121)
Creatinine, Urine mg/day-CATEUR: 2.81 g/(24.h) — ABNORMAL HIGH (ref 0.63–2.50)
Dopamine, 24 hr Urine: 383 mcg/24 h (ref 52–480)
Epinephrine, 24 hr Urine: 7 mcg/24 h (ref 2–24)
Norepinephrine, 24 hr Ur: 58 mcg/24 h (ref 15–100)
Total Volume - CF 24Hr U: 2400 mL

## 2011-06-26 ENCOUNTER — Ambulatory Visit: Payer: BC Managed Care – PPO | Admitting: Internal Medicine

## 2011-06-27 ENCOUNTER — Ambulatory Visit: Payer: BC Managed Care – PPO | Admitting: Internal Medicine

## 2011-06-27 ENCOUNTER — Telehealth: Payer: Self-pay | Admitting: Internal Medicine

## 2011-06-27 MED ORDER — CLONAZEPAM 0.5 MG PO TABS: 0.5000 mg | ORAL_TABLET | Freq: Every evening | ORAL | Status: DC | PRN

## 2011-06-27 NOTE — Telephone Encounter (Signed)
OK X1 

## 2011-06-27 NOTE — Telephone Encounter (Signed)
Last filled 03/26/11, Dr.Hopper please advise (patient was scheduled for appointment today-clonazepam and citalopram not working, patient cancelled appointment due to family emergency)

## 2011-07-09 ENCOUNTER — Other Ambulatory Visit: Payer: Self-pay | Admitting: Internal Medicine

## 2011-07-18 ENCOUNTER — Ambulatory Visit (INDEPENDENT_AMBULATORY_CARE_PROVIDER_SITE_OTHER): Payer: BC Managed Care – PPO | Admitting: Internal Medicine

## 2011-07-18 ENCOUNTER — Encounter: Payer: Self-pay | Admitting: Internal Medicine

## 2011-07-18 DIAGNOSIS — I10 Essential (primary) hypertension: Secondary | ICD-10-CM

## 2011-07-18 DIAGNOSIS — G479 Sleep disorder, unspecified: Secondary | ICD-10-CM

## 2011-07-18 NOTE — Patient Instructions (Addendum)
Pending the sleep specialty evaluation, increase the citalopram to 20 mg one & 1/2  half daily. Please try to avoid the daytime nap.To prevent sleep dysfunction follow these instructions for sleep hygiene. Do not read, watch TV, or eat in bed. Do not get into bed until you are ready to turn off the light &  to go to sleep. Do not ingest stimulants ( decongestants, diet pills, nicotine, caffeine) after the evening meal.  Increase the Toprol XL 25 to TWO pills daily and monitor blood pressure. Blood Pressure Goal  Ideally is an AVERAGE < 135/85. This AVERAGE should be calculated from @ least 5-7 BP readings taken @ different times of day on different days of week. You should not respond to isolated BP readings , but rather the AVERAGE for that week

## 2011-07-18 NOTE — Progress Notes (Signed)
  Subjective:    Patient ID: Morgan Andrews, female    DOB: 1960/12/29, 50 y.o.   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  Impact: Daytime hypersomnolence: only in first part of shift Motor vehicle accident/motor dysfunction:no Treatment to date/efficacy: Meds to date not effective. She continues to have anxiety despite the citalopram and clonazepam.      Review of Systems     Objective:   Physical Exam she is healthy and well-nourished; in no acute distress  She has no scleral icterus. Extraocular motions intact without lid length  She has slight nasal septum dislocation without obstruction  Thyroid is normal to palpation without enlargement or nodularity  Chest is clear without rhonchi, rales, or wheezing  She has an S4 without significant murmurs or gallops. Deep tendon reflexes are normal. She has no tremor.  Nail health is excellent without any onycholysis.  She has no cyanosis, clubbing, or edema.  All pulses are intact; no bruits are present. She has no ischemic skin changes.  She is oriented x3. Affect is normal with no suggestion of anxiety or depression clinically          Assessment & Plan:  #1 sleep disorder; multifactorial. This appears to be mainly related to "shift work" factors additionally she does take a daytime nap. Recently there is a question of snoring, this raises the question of sleep apnea  #2 anxiety, but mainly  job-related. The citalopram and clonazepam controlled the issue.  #3 hypertension suboptimal control despite beta blocker  Plan: Referral to a sleep specialist is appropriate pending response to sleep hygiene maneuvers.

## 2011-08-14 ENCOUNTER — Other Ambulatory Visit: Payer: Self-pay | Admitting: Internal Medicine

## 2011-08-16 ENCOUNTER — Other Ambulatory Visit: Payer: Self-pay | Admitting: Internal Medicine

## 2011-08-16 NOTE — Telephone Encounter (Signed)
RX was filled for #30 with 11 refills. I reviewed OV note (patient instructions) medication was changed to twice daily. I called the pharmacy and changed rx to #60 with 11 refills

## 2011-09-07 ENCOUNTER — Other Ambulatory Visit: Payer: Self-pay | Admitting: Internal Medicine

## 2011-09-07 NOTE — Telephone Encounter (Signed)
Patient called & said citalopram was changed from 1 pill to 1 1/2 daily, in may at her cpx --needs new rx  [facility] [address]

## 2011-09-07 NOTE — Telephone Encounter (Signed)
Patient aware Dr.Hopper is out of the office and I reviewed her note (no mention of med change), patient aware I will forward to Dr.Hopper who is currently out of the office and will send new rx in on Monday. Patient ok'd   Dr.Hopper please advise

## 2011-09-09 NOTE — Telephone Encounter (Signed)
Citalopram 20 mg 1&1/2 qd # 90

## 2011-09-10 MED ORDER — CITALOPRAM HYDROBROMIDE 20 MG PO TABS: ORAL_TABLET | ORAL | Status: DC

## 2011-09-10 NOTE — Telephone Encounter (Signed)
Patient called back and requested 3 month supply at one time

## 2011-09-10 NOTE — Telephone Encounter (Signed)
Left message for patient to call and let me know if she would like 1 month or 3 month supply at once

## 2011-10-01 ENCOUNTER — Other Ambulatory Visit: Payer: Self-pay | Admitting: Internal Medicine

## 2011-10-01 MED ORDER — CLONAZEPAM 0.5 MG PO TABS: 0.5000 mg | ORAL_TABLET | Freq: Every evening | ORAL | Status: DC | PRN

## 2011-10-01 NOTE — Telephone Encounter (Signed)
rx sent

## 2011-12-31 ENCOUNTER — Other Ambulatory Visit: Payer: Self-pay | Admitting: Internal Medicine

## 2011-12-31 MED ORDER — CLONAZEPAM 0.5 MG PO TABS: 0.5000 mg | ORAL_TABLET | Freq: Every evening | ORAL | Status: DC | PRN

## 2011-12-31 NOTE — Telephone Encounter (Signed)
RX sent

## 2012-02-12 ENCOUNTER — Ambulatory Visit (INDEPENDENT_AMBULATORY_CARE_PROVIDER_SITE_OTHER): Payer: BC Managed Care – PPO | Admitting: Family

## 2012-02-12 ENCOUNTER — Encounter: Payer: Self-pay | Admitting: Family

## 2012-02-12 VITALS — BP 158/90 | HR 88 | Temp 98.0°F | Resp 16

## 2012-02-12 DIAGNOSIS — J029 Acute pharyngitis, unspecified: Secondary | ICD-10-CM

## 2012-02-12 DIAGNOSIS — R030 Elevated blood-pressure reading, without diagnosis of hypertension: Secondary | ICD-10-CM

## 2012-02-12 LAB — POCT RAPID STREP A (OFFICE): Rapid Strep A Screen: NEGATIVE

## 2012-02-12 MED ORDER — AMLODIPINE BESYLATE 5 MG PO TABS: 5.0000 mg | ORAL_TABLET | Freq: Every day | ORAL | Status: DC

## 2012-02-12 MED ORDER — AMOXICILLIN 500 MG PO CAPS: 500.0000 mg | ORAL_CAPSULE | Freq: Three times a day (TID) | ORAL | Status: AC

## 2012-02-12 NOTE — Assessment & Plan Note (Signed)
BP Readings from Last 3 Encounters:  02/12/12 158/90  07/18/11 140/100  04/10/11 130/82  Deteriorated.  Add amlodipine 5mg , pt instructed to follow up with Dr. Alwyn Ren in 2 weeks for BP recheck.

## 2012-02-12 NOTE — Patient Instructions (Signed)
Motrin 600mg  every 6 hours as needed for pain.  Call if symptoms worsen or if not improved in 2-3 days.

## 2012-02-12 NOTE — Assessment & Plan Note (Signed)
Rapid strep is neg, but pt has been febrile with cervical LAD. Start amoxicillin, send throat culture. Plan to d/c if neg.

## 2012-02-12 NOTE — Progress Notes (Signed)
  Subjective:    Patient ID: Morgan Andrews, female    DOB: [birth year], 51 y.o.   MRN: [id number]  HPI  Morgan Andrews is a 51 yr old female who presents today with chief complaint of sore throat.  Symptoms started yesterday. She reports fever of 101.  She denies associated cough. She denies smoking hx.  She has some pressure, but no sinus drainage.  Denies sick contacts.    Htn- reports + compliance with toprol xl 50mg  daily  Review of Systems See HPI  Past Medical History  Diagnosis Date  . Asthma   . Eczema   . Allergy   . Hiatal hernia   . Anxiety     classic Panic attacks with sweating & chest pain  . Cervical radiculopathy     C5-6    History   Social History  . Marital Status: Married    Spouse Name: N/A    Number of Children: N/A  . Years of Education: N/A   Occupational History  . Not on file.   Social History Main Topics  . Smoking status: Never Smoker   . Smokeless tobacco: Not on file  . Alcohol Use: No  . Drug Use: No  . Sexually Active: Not on file   Other Topics Concern  . Not on file   Social History Narrative  . No narrative on file    Past Surgical History  Procedure Date  . Ankle surgery     X 5, Dr Eulah Pont  . Tonsillectomy and adenoidectomy   . Cervical fusion 2008    fusion, discectomy & plating , Dr Lovell Sheehan  . Wrist surgery   . Upper gastrointestinal endoscopy 2007    negative    Family History  Problem Relation Age of Onset  . Adopted: Yes  . Asthma Mother   . Hypertension Mother   . Hypertension Maternal Aunt   . Stroke Maternal Aunt   . Rheum arthritis Maternal Aunt     Allergies  Allergen Reactions  . Clemastine Fumarate     REACTION: ASTHMA ATTACK( Note: Tavist D caused asthma flare from" excessive drying")    Current Outpatient Prescriptions on File Prior to Visit  Medication Sig Dispense Refill  . citalopram (CELEXA) 20 MG tablet 1 1/2 by mouth daily  135 tablet  2  . clonazePAM (KLONOPIN) 0.5 MG tablet Take 1  tablet (0.5 mg total) by mouth at bedtime as needed.  90 tablet  0  . metoprolol succinate (TOPROL-XL) 25 MG 24 hr tablet          BP 158/90  Pulse 88  Temp(Src) 98 F (36.7 C) (Oral)  Resp 16  SpO2 94%       Objective:   Physical Exam  Constitutional: She appears well-developed and well-nourished.  HENT:  Head: Normocephalic and atraumatic.       R TM is occluded by cerumen. L TM is normal  + erythema of oropharynx.  + clear blisters noted on uvula.   Cardiovascular: Normal rate and regular rhythm.   Pulmonary/Chest: Effort normal and breath sounds normal. No respiratory distress. She has no wheezes. She has no rales. She exhibits no tenderness.  Lymphadenopathy:    She has cervical adenopathy.          Assessment & Plan:

## 2012-02-13 ENCOUNTER — Telehealth: Payer: Self-pay | Admitting: Family

## 2012-02-13 LAB — STREP A DNA PROBE: GASP: NEGATIVE

## 2012-02-13 NOTE — Telephone Encounter (Signed)
Pls call pt and let her know that her strep culture is negative. She can stop antibiotics.

## 2012-02-13 NOTE — Telephone Encounter (Signed)
Call placed to patient at [phone], no answer. A detailed voice message was left informing patient per Sandford Craze instructions.

## 2012-02-26 ENCOUNTER — Ambulatory Visit: Payer: BC Managed Care – PPO | Admitting: Internal Medicine

## 2012-03-21 ENCOUNTER — Other Ambulatory Visit: Payer: Self-pay | Admitting: Internal Medicine

## 2012-03-21 MED ORDER — CLONAZEPAM 0.5 MG PO TABS: 0.5000 mg | ORAL_TABLET | Freq: Every evening | ORAL | Status: DC | PRN

## 2012-03-21 NOTE — Telephone Encounter (Signed)
RX sent

## 2012-03-21 NOTE — Telephone Encounter (Signed)
refill Clonazepam 0.5MG  Tablet PUR Qty 90 Last filled 1.21.13 Take 1-tablet at bedtime as needed  last appointment 3.5.13

## 2012-04-07 ENCOUNTER — Encounter: Payer: Self-pay | Admitting: Internal Medicine

## 2012-04-07 ENCOUNTER — Ambulatory Visit (INDEPENDENT_AMBULATORY_CARE_PROVIDER_SITE_OTHER): Payer: BC Managed Care – PPO | Admitting: Internal Medicine

## 2012-04-07 VITALS — BP 142/84 | HR 80 | Temp 98.2°F | Wt 189.0 lb

## 2012-04-07 DIAGNOSIS — H6980 Other specified disorders of Eustachian tube, unspecified ear: Secondary | ICD-10-CM

## 2012-04-07 DIAGNOSIS — H6990 Unspecified Eustachian tube disorder, unspecified ear: Secondary | ICD-10-CM | POA: Insufficient documentation

## 2012-04-07 MED ORDER — FLUTICASONE PROPIONATE 50 MCG/ACT NA SUSP: 2.0000 | Freq: Every day | NASAL | Status: DC

## 2012-04-07 NOTE — Progress Notes (Signed)
  Subjective:    Patient ID: Morgan Andrews, female    DOB: 1960-12-19, 51 y.o.   MRN: [id number]  HPI  Acute visit Six-week history of a rather sudden onset of left ear muffling "like I can hear myself".  Past Medical History  Diagnosis Date  . Asthma   . Eczema   . Allergy   . Hiatal hernia   . Anxiety     classic Panic attacks with sweating & chest pain  . Cervical radiculopathy     C5-6     Review of Systems No fever chills, no recent URI symptoms. No sore throat. Is only in the last 2 weeks that she has developed some itchy eyes and nose, she thinks related to allergies. Denies any ear discharge, ear pain or decreased hearing.    Objective:   Physical Exam General -- alert, well-developed. No apparent distress.  Neck --no LADs HEENT -- TMs normal, throat w/o redness, tonsils absent , face symmetric and minimal tenderness at L ,maxilary-frontal sinus  to palpation, nose not congested  Lungs -- normal respiratory effort, no intercostal retractions, no accessory muscle use, and normal breath sounds.   Heart-- normal rate, regular rhythm, no murmur, and no gallop.   Psych-- Cognition and judgment appear intact. Alert and cooperative with normal attention span and concentration.  not anxious appearing and not depressed appearing.        Assessment & Plan:

## 2012-04-07 NOTE — Assessment & Plan Note (Signed)
6 weeks history of what seems to be a ET dysfunction, it is only in the last 2 weeks that she had noticeable allergy symptoms. Exam is  negative except for a question of tenderness on the left face noting that she is not running fevers, feeling ill or has nasal discharge. Plan: Trial with Claritin and fluticasone, if not better will need ENT referral.

## 2012-04-07 NOTE — Patient Instructions (Signed)
Over-the-counter Claritin 10 mg one tablet daily. Fluticasone nose spray, 2 sprays in each side of the nose every day If not better in the next 10 days, please see the ENT doctor, call for a referral if needed.

## 2012-04-18 ENCOUNTER — Ambulatory Visit (INDEPENDENT_AMBULATORY_CARE_PROVIDER_SITE_OTHER): Payer: BC Managed Care – PPO | Admitting: Internal Medicine

## 2012-04-18 ENCOUNTER — Encounter: Payer: Self-pay | Admitting: Internal Medicine

## 2012-04-18 VITALS — BP 160/100 | HR 96 | Temp 98.1°F | Wt 189.0 lb

## 2012-04-18 DIAGNOSIS — I10 Essential (primary) hypertension: Secondary | ICD-10-CM | POA: Insufficient documentation

## 2012-04-18 DIAGNOSIS — R079 Chest pain, unspecified: Secondary | ICD-10-CM

## 2012-04-18 DIAGNOSIS — R61 Generalized hyperhidrosis: Secondary | ICD-10-CM

## 2012-04-18 DIAGNOSIS — R9431 Abnormal electrocardiogram [ECG] [EKG]: Secondary | ICD-10-CM

## 2012-04-18 DIAGNOSIS — F419 Anxiety disorder, unspecified: Secondary | ICD-10-CM

## 2012-04-18 DIAGNOSIS — R82998 Other abnormal findings in urine: Secondary | ICD-10-CM

## 2012-04-18 DIAGNOSIS — F411 Generalized anxiety disorder: Secondary | ICD-10-CM

## 2012-04-18 DIAGNOSIS — R8290 Unspecified abnormal findings in urine: Secondary | ICD-10-CM

## 2012-04-18 DIAGNOSIS — R000 Tachycardia, unspecified: Secondary | ICD-10-CM

## 2012-04-18 LAB — TSH: TSH: 1.52 u[IU]/mL (ref 0.35–5.50)

## 2012-04-18 LAB — T3, FREE: T3, Free: 2.4 pg/mL (ref 2.3–4.2)

## 2012-04-18 LAB — T4, FREE: Free T4: 0.63 ng/dL (ref 0.60–1.60)

## 2012-04-18 MED ORDER — AMLODIPINE BESYLATE 5 MG PO TABS: 5.0000 mg | ORAL_TABLET | Freq: Every day | ORAL | Status: DC

## 2012-04-18 NOTE — Patient Instructions (Addendum)
Order for x-rays entered into  the computer; these will be performed at [facility]. No appointment is necessary.    Blood Pressure Goal  Ideally is an AVERAGE < 135/85. This AVERAGE should be calculated from @ least 5-7 BP readings taken @ different times of day on different days of week. You should not respond to isolated BP readings , but rather the AVERAGE for that week Please try to go on My Chart within the next 24 hours to allow me to release the results directly to you.

## 2012-04-18 NOTE — Progress Notes (Signed)
Subjective:    Patient ID: Morgan Andrews, female    DOB: 1961-04-19, 51 y.o.   MRN: [id number]  HPI She has had issues with what she calls "anxiety or panic attacks" for over a year; this did improve until February of this year. There was no specific trigger there has been increase in frequency of episodes. There has been stress related to her job.  The episodes are described as a racing of the heart with chest pain, diaphoresis, and occasionally dull headache. The headache  can be frontal or random. She has no associated diarrhea.  She is now having them every 2-3 days and they can last minutes to  Hours  Past medical history/family history/social history were all reviewed and updated. Pertinent data: Positive family history for panic and anxiety as well as depression in both her mother and sister. She knows of no family history coronary disease  Her neurologist/sleep specialist as prescribed Savella & trazadone. I previously had prescribed citalopram and clonazepam.    Review of Systems CHEST PAIN: Onset : > 2 yrs ago Last episode: 5/8 Location: SS  Quality: dull "squeeze"  Duration: 5-20 min Radiation: L shoulder blade  Better with: no relivers Worse with: stress  History of Trauma/lifting: no  Nausea/vomiting: no Shortness of breath: slightly  Pleuritic: no  Cough: no Edema: no PND: no  Dizziness:no  Palpitations: yes  Syncope: no  Indigestion: yes   Red Flags Worse with exertion: no     Objective:   Physical Exam Gen.:  well-nourished in appearance. Alert, appropriate and cooperative throughout exam. Head: Normocephalic without obvious abnormalities Eyes: No corneal or conjunctival inflammation noted. Pupils equal round reactive to light and accommodation. Slight lid lag.Extraocular motion intact.  Mouth: Oral mucosa and oropharynx reveal no lesions or exudates. Teeth in good repair. Neck: No deformities, masses, or tenderness noted. Range of motion & Thyroid  normal Lungs: Normal respiratory effort; chest expands symmetrically. Lungs are clear to auscultation without rales, wheezes, or increased work of breathing. Heart: Normal rate and rhythm. Normal S1 and S2. No gallop, click, or rub. S4 w/o  murmur. Abdomen: Bowel sounds normal; abdomen soft and nontender. No masses, organomegaly or hernias noted.                                                                             Musculoskeletal/extremities:  No clubbing, cyanosis, edema, or deformity noted. Joints normal. Nail health  good. Vascular: Carotid, radial artery, dorsalis pedis and  posterior tibial pulses are full and equal. No bruits present. Neurologic: Alert and oriented x3. Deep tendon reflexes symmetrical and normal. No tremor     Skin: Intact without suspicious lesions or rashes. Lymph: No cervical, axillary lymphadenopathy present. Psych: Mood and affect are normal. Normally interactive  Assessment & Plan:  #1 paroxysmal episodes of tachycardia, chest pain, diaphoresis and headache.  #2 past history and family history of anxiety and panic.   #3 hypertension, uncontrolled. She is only on a beta blocker; amlodipine will be added.   Plan: Thyroid function tests; 24 urine for catecholamines and metanephrines; an event monitor will be pursued. I am concerned about the "polypharmacy "she is on.  I have encouraged  her to be seen during acute attack. At that time EKG, d-dimer, CK-MB, troponin would be appropriate

## 2012-04-23 ENCOUNTER — Telehealth: Payer: Self-pay

## 2012-04-23 ENCOUNTER — Telehealth: Payer: Self-pay | Admitting: Internal Medicine

## 2012-04-23 NOTE — Telephone Encounter (Signed)
Patient has appt for 04/24/12 for monitor

## 2012-04-23 NOTE — Telephone Encounter (Signed)
ERROR

## 2012-04-24 ENCOUNTER — Encounter (INDEPENDENT_AMBULATORY_CARE_PROVIDER_SITE_OTHER): Payer: BC Managed Care – PPO

## 2012-04-24 DIAGNOSIS — R002 Palpitations: Secondary | ICD-10-CM

## 2012-04-24 LAB — METANEPHRINES, URINE, 24 HOUR
Metaneph Total, Ur: 1169 mcg/24 h — ABNORMAL HIGH (ref 224–832)
Metanephrines, Ur: 231 mcg/24 h (ref 90–315)
Normetanephrine, 24H Ur: 938 mcg/24 h — ABNORMAL HIGH (ref 122–676)

## 2012-04-25 ENCOUNTER — Encounter: Payer: Self-pay | Admitting: Internal Medicine

## 2012-04-25 LAB — CATECHOLAMINES, FRACTIONATED, URINE, 24 HOUR
Calculated Total (E+NE): 176 mcg/24 h — ABNORMAL HIGH (ref 26–121)
Creatinine, Urine mg/day-CATEUR: 2.98 g/(24.h) — ABNORMAL HIGH (ref 0.63–2.50)
Dopamine, 24 hr Urine: 451 mcg/24 h (ref 52–480)
Epinephrine, 24 hr Urine: 13 mcg/24 h (ref 2–24)
Norepinephrine, 24 hr Ur: 163 mcg/24 h — ABNORMAL HIGH (ref 15–100)
Total Volume - CF 24Hr U: 2500 mL

## 2012-04-26 ENCOUNTER — Encounter: Payer: Self-pay | Admitting: Internal Medicine

## 2012-04-28 ENCOUNTER — Other Ambulatory Visit (INDEPENDENT_AMBULATORY_CARE_PROVIDER_SITE_OTHER): Payer: BC Managed Care – PPO

## 2012-04-28 ENCOUNTER — Encounter: Payer: Self-pay | Admitting: Internal Medicine

## 2012-04-28 ENCOUNTER — Other Ambulatory Visit: Payer: Self-pay | Admitting: Internal Medicine

## 2012-04-28 DIAGNOSIS — F419 Anxiety disorder, unspecified: Secondary | ICD-10-CM

## 2012-04-28 DIAGNOSIS — Z01818 Encounter for other preprocedural examination: Secondary | ICD-10-CM

## 2012-04-28 DIAGNOSIS — R825 Elevated urine levels of drugs, medicaments and biological substances: Secondary | ICD-10-CM

## 2012-04-28 LAB — BASIC METABOLIC PANEL
BUN: 13 mg/dL (ref 6–23)
CO2: 30 mEq/L (ref 19–32)
Calcium: 10 mg/dL (ref 8.4–10.5)
Chloride: 105 mEq/L (ref 96–112)
Creatinine, Ser: 0.8 mg/dL (ref 0.4–1.2)
GFR: 77.19 mL/min (ref >60.00)
Glucose, Bld: 96 mg/dL (ref 70–99)
Potassium: 4.7 mEq/L (ref 3.5–5.1)
Sodium: 145 mEq/L (ref 135–145)

## 2012-04-28 NOTE — Progress Notes (Signed)
LABS ONLY  

## 2012-04-29 ENCOUNTER — Encounter: Payer: Self-pay | Admitting: Internal Medicine

## 2012-04-29 ENCOUNTER — Telehealth: Payer: Self-pay | Admitting: Internal Medicine

## 2012-04-29 ENCOUNTER — Other Ambulatory Visit: Payer: Self-pay | Admitting: Internal Medicine

## 2012-04-29 DIAGNOSIS — R825 Elevated urine levels of drugs, medicaments and biological substances: Secondary | ICD-10-CM

## 2012-04-29 NOTE — Progress Notes (Signed)
Addended by: Maurice Small on: 04/29/2012 01:39 PM   Modules accepted: Orders

## 2012-04-29 NOTE — Progress Notes (Signed)
Addended by: Maurice Small on: 04/29/2012 02:06 PM   Modules accepted: Orders

## 2012-04-29 NOTE — Telephone Encounter (Signed)
Dr. Alwyn Ren, in reference to CT Abdomen insurance did approve.  However per [employer] the CT Pelvis is not approved at the nurse reviewer level.  She stated it did not meet clinical guidelines, and will require peer to peer review.  Please call phone [phone], press option 2, reference patient's name, her DOB is [id number], and the insurance ID of [id number].  I have also faxed all notes, labs to fax [phone].

## 2012-04-30 ENCOUNTER — Encounter: Payer: Self-pay | Admitting: Internal Medicine

## 2012-04-30 NOTE — Telephone Encounter (Signed)
The concern clinically is for pheochromocytoma. This is usually located in the adrenal area pain but it can be anywhere in the abdomen/pelvis. If they won't cover the pelvis CT based on this & clinical notes proceed with the abdominal CT. If If that's negative we would have to come back and do the pelvic CT to rule out a pheochromocytoma located atypically.  I shall also refer this to the chief of our department for review by the [place] [facility] review, Quality assurance  and possibly [place] system to review as well

## 2012-04-30 NOTE — Telephone Encounter (Signed)
Both studies have been approved, auth# [id number]

## 2012-05-01 ENCOUNTER — Encounter: Payer: Self-pay | Admitting: Internal Medicine

## 2012-05-01 ENCOUNTER — Ambulatory Visit (HOSPITAL_BASED_OUTPATIENT_CLINIC_OR_DEPARTMENT_OTHER)
Admission: RE | Admit: 2012-05-01 | Discharge: 2012-05-01 | Disposition: A | Discharge status: Home / Self Care | Payer: BC Managed Care – PPO | Source: Ambulatory Visit | Attending: Internal Medicine | Admitting: Internal Medicine

## 2012-05-01 DIAGNOSIS — R82998 Other abnormal findings in urine: Secondary | ICD-10-CM | POA: Insufficient documentation

## 2012-05-01 DIAGNOSIS — F419 Anxiety disorder, unspecified: Secondary | ICD-10-CM

## 2012-05-01 DIAGNOSIS — F411 Generalized anxiety disorder: Secondary | ICD-10-CM | POA: Insufficient documentation

## 2012-05-01 DIAGNOSIS — K7689 Other specified diseases of liver: Secondary | ICD-10-CM | POA: Insufficient documentation

## 2012-05-01 DIAGNOSIS — K449 Diaphragmatic hernia without obstruction or gangrene: Secondary | ICD-10-CM | POA: Insufficient documentation

## 2012-05-01 DIAGNOSIS — R825 Elevated urine levels of drugs, medicaments and biological substances: Secondary | ICD-10-CM

## 2012-05-01 MED ORDER — IOHEXOL 300 MG/ML  SOLN
100.0000 mL | Freq: Once | INTRAMUSCULAR | Status: AC | PRN
  Administered 2012-05-01: 100 mL via INTRAVENOUS

## 2012-05-01 NOTE — Telephone Encounter (Signed)
Were you able to get approval?

## 2012-05-05 NOTE — Progress Notes (Signed)
Addended byMarga Melnick F on: 05/05/2012 02:59 PM   Modules accepted: Orders

## 2012-05-05 NOTE — Progress Notes (Signed)
  Subjective:    Patient ID: Morgan Andrews, female    DOB: 1961/02/08, 51 y.o.   MRN: [id number]  HPI    Review of Systems     Objective:   Physical Exam        Assessment & Plan:  CAT scan of the abdomen and pelvis did not reveal a pheochromocytoma. The literature recommends imaging of the chest and neck if those studies are negative. An additional option would be an 123 I-MIBG  (metaiodobenzylguanide scintigraphy )study. This has a 94% sensitivity and 92% specificity according to the Journal of Clinical  Endocrinology and Metabolism 2010 June; 95 (6): 2596. I reviewed the 24 urine studies. Normetanephrine pheochromocytoma is unlikely if the normetanephrine level is less than 550 MCG/24 hours; her value was 938. Metanephrine pheochromocytoma is unlikely if the 24 collection is less than 197 MCG/24 hours. Her value was 231. Total metanephrine of less than 1140 MCG per 24 hours is the third "cut " point. Her value was 1169. I shall pursue an Endocrinology consult rather than additional imaging at this time based on these data.

## 2012-05-20 ENCOUNTER — Ambulatory Visit: Payer: BC Managed Care – PPO | Admitting: Endocrinology

## 2012-05-23 ENCOUNTER — Encounter: Payer: Self-pay | Admitting: Endocrinology

## 2012-05-23 ENCOUNTER — Ambulatory Visit (INDEPENDENT_AMBULATORY_CARE_PROVIDER_SITE_OTHER): Payer: BC Managed Care – PPO | Admitting: Endocrinology

## 2012-05-23 VITALS — BP 122/78 | HR 96 | Temp 98.4°F | Ht 65.0 in | Wt 192.0 lb

## 2012-05-23 DIAGNOSIS — I10 Essential (primary) hypertension: Secondary | ICD-10-CM

## 2012-05-23 NOTE — Patient Instructions (Addendum)
Let's recheck the 24-hr urine test.  Please pick up a jug from the lab downstairs. i would be happy to see you back here whenever you want.

## 2012-05-23 NOTE — Progress Notes (Signed)
Subjective:    Patient ID: Morgan Andrews, female    DOB: 10/09/1961, 51 y.o.   MRN: [id number]  HPI Pt statew few years of intermittent but daily moderate palpitations in the chest, and assoc anxiety.  Past Medical History  Diagnosis Date  . Asthma   . Eczema   . Allergy   . Hiatal hernia   . Anxiety     classic Panic attacks with sweating & chest pain  . Cervical radiculopathy     C5-6    Past Surgical History  Procedure Date  . Ankle surgery     X 5, Dr Eulah Pont  . Tonsillectomy and adenoidectomy   . Cervical fusion 2008    fusion, discectomy & plating , Dr Lovell Sheehan  . Wrist surgery   . Upper gastrointestinal endoscopy 2007    negative    History   Social History  . Marital Status: Married    Spouse Name: N/A    Number of Children: N/A  . Years of Education: N/A   Occupational History  . Not on file.   Social History Main Topics  . Smoking status: Never Smoker   . Smokeless tobacco: Not on file  . Alcohol Use: No  . Drug Use: No  . Sexually Active: Not on file   Other Topics Concern  . Not on file   Social History Narrative  . No narrative on file    Current Outpatient Prescriptions on File Prior to Visit  Medication Sig Dispense Refill  . amLODipine (NORVASC) 5 MG tablet Take 1 tablet (5 mg total) by mouth daily.  30 tablet  5  . citalopram (CELEXA) 20 MG tablet Take 20 mg by mouth daily. 1 by mouth daily      . clonazePAM (KLONOPIN) 0.5 MG tablet Take 1 tablet (0.5 mg total) by mouth at bedtime as needed.  90 tablet  0  . fluticasone (FLONASE) 50 MCG/ACT nasal spray Place 2 sprays into the nose daily.  16 g  6  . metoprolol succinate (TOPROL-XL) 25 MG 24 hr tablet Take 50 mg by mouth daily.       . Milnacipran (SAVELLA) 50 MG TABS Take 50 mg by mouth 2 (two) times daily. Take 1 tablet  Every morning and 1/2 tablet at bedtime.      . citalopram (CELEXA) 20 MG tablet Take 1 tablet (20 mg total) by mouth daily.  30 tablet  2    Allergies  Allergen  Reactions  . Clemastine Fumarate     REACTION: ASTHMA ATTACK( Note: Tavist D caused asthma flare from" excessive drying")    Family History  Problem Relation Age of Onset  . Adopted: Yes  . Asthma Mother   . Hypertension Mother   . Hypertension Maternal Aunt   . Stroke Maternal Aunt   . Rheum arthritis Maternal Aunt   . Anxiety disorder Mother     with panic attacks & depression  . Anxiety disorder Sister     as above  no known adrenal dz  BP 122/78  Pulse 96  Temp 98.4 F (36.9 C) (Oral)  Ht 5\' 5"  (1.651 m)  Wt 192 lb (87.091 kg)  BMI 31.95 kg/m2  SpO2 96%  Review of Systems denies pallor, n/v, syncope, diarrhea, depression, visual loss, fever, rhinorrhea, and easy bruising.  She reports non-exertional chest pain, weight gain, flushing, arthralgias, and doe.      Objective:   Physical Exam VS: see vs page GEN: no distress HEAD: head:  no deformity eyes: no periorbital swelling, no proptosis external nose and ears are normal mouth: no lesion seen NECK: supple, thyroid is not enlarged CHEST WALL: no deformity LUNGS:  Clear to auscultation CV: reg rate and rhythm, no murmur ABD: abdomen is soft, nontender.  no hepatosplenomegaly.  not distended.  no hernia MUSCULOSKELETAL: muscle bulk and strength are grossly normal.  no obvious joint swelling.  gait is normal and steady EXTEMITIES: no deformity.  no ulcer on the feet.  feet are of normal color and temp.  no edema PULSES: dorsalis pedis intact bilat.  no carotid bruit NEURO:  cn 2-12 grossly intact.   readily moves all 4's.  sensation is intact to touch on the feet SKIN:  Normal texture and temperature.  No rash.  Not diaphoretic.  There are 3 cafe-au-lait spots: left lower back, and 1 on each foot.   NODES:  None palpable at the neck PSYCH: alert, oriented x3.  Does not appear anxious nor depressed.  (i reviewed 24-hr urine results)    Assessment & Plan:  Palpitations, uncertain etiology Anxiety.  In most cases,  this explains sxs HTN.  Pheochromocytoma is unlikely due to 24-hr urines so far, and neg CT

## 2012-05-26 ENCOUNTER — Encounter: Payer: Self-pay | Admitting: Internal Medicine

## 2012-05-26 ENCOUNTER — Other Ambulatory Visit: Payer: BC Managed Care – PPO

## 2012-05-26 DIAGNOSIS — I10 Essential (primary) hypertension: Secondary | ICD-10-CM

## 2012-05-29 LAB — CATECHOLAMINES, FRACTIONATED, URINE, 24 HOUR
Calculated Total (E+NE): 124 mcg/24 h — ABNORMAL HIGH (ref 26–121)
Creatinine, Urine mg/day-CATEUR: 2.28 g/(24.h) (ref 0.63–2.50)
Dopamine, 24 hr Urine: 384 mcg/24 h (ref 52–480)
Epinephrine, 24 hr Urine: 13 mcg/24 h (ref 2–24)
Norepinephrine, 24 hr Ur: 111 mcg/24 h — ABNORMAL HIGH (ref 15–100)
Total Volume - CF 24Hr U: 2200 mL

## 2012-05-29 LAB — METANEPHRINES, URINE, 24 HOUR
Metaneph Total, Ur: 651 mcg/24 h (ref 224–832)
Metanephrines, Ur: 121 mcg/24 h (ref 90–315)
Normetanephrine, 24H Ur: 530 mcg/24 h (ref 122–676)

## 2012-06-06 ENCOUNTER — Encounter: Payer: Self-pay | Admitting: Internal Medicine

## 2012-06-06 ENCOUNTER — Telehealth: Payer: Self-pay | Admitting: *Deleted

## 2012-06-06 DIAGNOSIS — I499 Cardiac arrhythmia, unspecified: Secondary | ICD-10-CM

## 2012-06-06 DIAGNOSIS — R079 Chest pain, unspecified: Secondary | ICD-10-CM

## 2012-06-06 NOTE — Telephone Encounter (Signed)
Pt requesting Holter Monitor results of Holter Monitor test results.  It was done on 5/16 and reports she has not received a call yet.  Pt leaving on vacation for 10 days.  I received the call from Aggie Cosier from [facility] who spoke with pt today.

## 2012-07-07 ENCOUNTER — Encounter: Payer: Self-pay | Admitting: *Deleted

## 2012-07-09 ENCOUNTER — Encounter: Payer: Self-pay | Admitting: Cardiology

## 2012-07-09 ENCOUNTER — Ambulatory Visit (INDEPENDENT_AMBULATORY_CARE_PROVIDER_SITE_OTHER): Payer: BC Managed Care – PPO | Admitting: Cardiology

## 2012-07-09 VITALS — BP 142/90 | HR 83 | Ht 65.0 in | Wt 187.0 lb

## 2012-07-09 DIAGNOSIS — R079 Chest pain, unspecified: Secondary | ICD-10-CM

## 2012-07-09 DIAGNOSIS — I10 Essential (primary) hypertension: Secondary | ICD-10-CM

## 2012-07-09 NOTE — Patient Instructions (Addendum)
Your physician has requested that you have en exercise stress myoview. For further information please visit [email or web address] Please follow instruction sheet, as given.  You do need to  schedule a follow-up appointment with Dr Shirlee Latch.

## 2012-07-10 DIAGNOSIS — R079 Chest pain, unspecified: Secondary | ICD-10-CM | POA: Insufficient documentation

## 2012-07-10 NOTE — Assessment & Plan Note (Signed)
Mildly elevated BP today.  She recently started on amlodipine.  I will not change her regimen today.

## 2012-07-10 NOTE — Progress Notes (Signed)
Patient ID: Morgan Andrews, female   DOB: [birth year], 51 y.o.   MRN: [id number] PCP: Dr. Alwyn Ren  51 yo with history of HTN presents for evaluation of chest pain.  For the last 1-2 weeks, she has had multiple episodes of central chest aching that radiates to her back and left shoulder. The pain is not exertional.  She has had occasional chest pain like this for a year but never so frequently.  She does not get chest pain with exertion.  She has a history of periodic palpitations and wore an event monitor in 5/13.  No significant arrhythmias were seen.  She denies exertional dyspnea.  She had similar chest pain symptoms back in 2007 and had a stress test done at that time that was normal.   ECG: NSR, nonspecific diffuse ST depression  Labs (5/13): creatinine 0.8, TSH normal  PMH: 1. HTN: negative workup for pheochromocytoma.  2. Depression 3. Anxiety 4. Event monitor (5/13) showed only NSR, no significant arrhythmias. 5. Fibromyalgia 6. Sleep study in 2012 was normal per patient's report.  7. Echo (7/07): EF 60-65% with mild LVH.   FH: Adopted, does not know family history.   SH: Works from home for [employer].  Married.  Nonsmoker.    ROS: All systems reviewed and negative except as per HPI.   Current Outpatient Prescriptions  Medication Sig Dispense Refill  . amLODipine (NORVASC) 5 MG tablet Take 1 tablet (5 mg total) by mouth daily.  30 tablet  5  . clonazePAM (KLONOPIN) 0.5 MG tablet Take 1 tablet (0.5 mg total) by mouth at bedtime as needed.  90 tablet  0  . escitalopram (LEXAPRO) 10 MG tablet Take 10 mg by mouth daily.       . metoprolol succinate (TOPROL-XL) 25 MG 24 hr tablet Take 50 mg by mouth daily.       . Milnacipran (SAVELLA) 50 MG TABS Take 50 mg by mouth 2 (two) times daily. Take 1 tablet  Every morning and 1/2 tablet at bedtime.      . traZODone (DESYREL) 100 MG tablet Take 100 mg by mouth at bedtime.         BP 142/90  Pulse 83  Ht 5\' 5"  (1.651 m)  Wt 187 lb  (84.823 kg)  BMI 31.12 kg/m2  SpO2 98% General: NAD, overweight Neck: No JVD, no thyromegaly or thyroid nodule.  Lungs: Clear to auscultation bilaterally with normal respiratory effort. CV: Nondisplaced PMI.  Heart regular S1/S2, no S3/S4, no murmur.  No peripheral edema.  No carotid bruit.  Normal pedal pulses.  Abdomen: Soft, nontender, no hepatosplenomegaly, no distention.  Skin: Intact without lesions or rashes.  Neurologic: Alert and oriented x 3.  Psych: Normal affect. Extremities: No clubbing or cyanosis.  HEENT: Normal.

## 2012-07-10 NOTE — Assessment & Plan Note (Signed)
Patient has been having increasing episodes of atypical chest pain.  Risk factor is HTN.  No smoking, DM, hyperlipidemia, family history.  However, her ECG is abnormal with rather diffuse < 1 mm ST depression. Given the abnormal ECG, I would like to do an ETT-myoview for risk stratification.  She should take ASA 81 mg daily at least until I have seen the results of the stress test.

## 2012-07-17 ENCOUNTER — Ambulatory Visit (HOSPITAL_COMMUNITY): Payer: BC Managed Care – PPO | Attending: Cardiology | Admitting: Radiology

## 2012-07-17 VITALS — BP 118/84 | HR 78 | Ht 65.0 in | Wt 188.0 lb

## 2012-07-17 DIAGNOSIS — R002 Palpitations: Secondary | ICD-10-CM | POA: Insufficient documentation

## 2012-07-17 DIAGNOSIS — R61 Generalized hyperhidrosis: Secondary | ICD-10-CM | POA: Insufficient documentation

## 2012-07-17 DIAGNOSIS — R000 Tachycardia, unspecified: Secondary | ICD-10-CM | POA: Insufficient documentation

## 2012-07-17 DIAGNOSIS — R9431 Abnormal electrocardiogram [ECG] [EKG]: Secondary | ICD-10-CM

## 2012-07-17 DIAGNOSIS — R0609 Other forms of dyspnea: Secondary | ICD-10-CM | POA: Insufficient documentation

## 2012-07-17 DIAGNOSIS — R0989 Other specified symptoms and signs involving the circulatory and respiratory systems: Secondary | ICD-10-CM | POA: Insufficient documentation

## 2012-07-17 DIAGNOSIS — R5381 Other malaise: Secondary | ICD-10-CM | POA: Insufficient documentation

## 2012-07-17 DIAGNOSIS — I10 Essential (primary) hypertension: Secondary | ICD-10-CM | POA: Insufficient documentation

## 2012-07-17 DIAGNOSIS — R0789 Other chest pain: Secondary | ICD-10-CM | POA: Insufficient documentation

## 2012-07-17 DIAGNOSIS — R079 Chest pain, unspecified: Secondary | ICD-10-CM

## 2012-07-17 MED ORDER — TECHNETIUM TC 99M TETROFOSMIN IV KIT
30.0000 | PACK | Freq: Once | INTRAVENOUS | Status: AC | PRN
  Administered 2012-07-17: 30 via INTRAVENOUS

## 2012-07-17 MED ORDER — TECHNETIUM TC 99M TETROFOSMIN IV KIT
10.0000 | PACK | Freq: Once | INTRAVENOUS | Status: AC | PRN
  Administered 2012-07-17: 10 via INTRAVENOUS

## 2012-07-17 NOTE — Progress Notes (Addendum)
[facility] SITE 3 NUCLEAR MED [address] [place] [address] [phone]  Cardiology Nuclear Med Study  Morgan Andrews is a 51 y.o. female     MRN : [id number]     DOB: 1961/01/07  Procedure Date: 07/17/2012  Nuclear Med Background Indication for Stress Test:  Evaluation for Ischemia and Abnormal EKG History:  7/07 Echo:EF=60-65%, mild LVH; 7/07 WNU:UVOZDG, EF=63%; 9/07 Cath:Normal coronaries, EF=55%. Cardiac Risk Factors: Hypertension, Adopted  Symptoms:  Chest Tightness (last episode of chest discomfort was this am prior to coming here, 6-7/10, none now), Diaphoresis, DOE/SOB, Fatigue, Palpitations and Rapid HR    Nuclear Pre-Procedure Caffeine/Decaff Intake:  None > 12 hrs NPO After: 8:00am   Lungs:  Clear.  Albuterol inhaler used prior to exercise for preventative measures. O2 Sat: 96% on room air. IV 0.9% NS with Angio Cath:  22g  IV Site: R Antecubital x 1, tolerated well IV Started by:  Irean Hong, RN  Chest Size (in):  38 Cup Size: C  Height: 5\' 5"  (1.651 m)  Weight:  188 lb (85.276 kg)  BMI:  Body mass index is 31.28 kg/(m^2). Tech Comments:  Toprol held x 24 hours, no medications taken today.    Nuclear Med Study 1 or 2 day study: 1 day  Stress Test Type:  Stress  Reading MD: Morgan Millers, MD  Order Authorizing Provider:  Marca Ancona, MD  Resting Radionuclide: Technetium 88m Tetrofosmin  Resting Radionuclide Dose: 11.0 mCi   Stress Radionuclide:  Technetium 35m Tetrofosmin  Stress Radionuclide Dose: 30.5 mCi           Stress Protocol Rest HR: 78 Stress HR: 157  Rest BP: Sitting 118/84  Standing 133/88 Stress BP: 213/94 and 212/98  Exercise Time (min): 8:00 METS: 10.1   Predicted Max HR: 170 bpm % Max HR: 92.35 bpm Rate Pressure Product: 64403   Dose of Adenosine (mg):  n/a Dose of Lexiscan: n/a mg  Dose of Atropine (mg): n/a Dose of Dobutamine: n/a mcg/kg/min (at max HR)  Stress Test Technologist: Smiley Houseman, CMA-N  Nuclear  Technologist:  Doyne Keel, CNMT     Rest Procedure:  Myocardial perfusion imaging was performed at rest 45 minutes following the intravenous administration of Technetium 23m Tetrofosmin.  Rest ECG: Nonspecific ST-T wave changes.  Stress Procedure:  The patient exercised on the treadmill utilizing the Bruce protocol for eight minutes. She then stopped due to fatigue.  She did c/o chest tightness, 3/10.  There were more diffuse ST-T wave changes.  She had a hypertensive response to exercise.  Technetium 71m Tetrofosmin was injected at peak exercise and myocardial perfusion imaging was performed after a brief delay. Stress ECG: Increased ST depression inferolaterally not diagnostic due to baseline changes.  QPS Raw Data Images:  Acquisition technically good; normal left ventricular size. Stress Images:  Normal homogeneous uptake in all areas of the myocardium. Rest Images:  Normal homogeneous uptake in all areas of the myocardium. Subtraction (SDS):  No evidence of ischemia. Transient Ischemic Dilatation (Normal <1.22):  0.79 Lung/Heart Ratio (Normal <0.45):  0.28  Quantitative Gated Spect Images QGS EDV:  54 ml QGS ESV:  12 ml  Impression Exercise Capacity:  Fair exercise capacity. BP Response:  Hypertensive blood pressure response. Clinical Symptoms:  There is chest pain. ECG Impression:   Increased ST depression inferolaterally not diagnostic due to baseline changes. Comparison with Prior Nuclear Study: No images to compare  Overall Impression:  Normal stress nuclear study.  LV  Ejection Fraction: 78%.  LV Wall Motion:  NL LV Function; NL Wall Motion   Morgan Andrews  Normal study with hypertensive response to exercise.   Marca Ancona 07/18/2012

## 2012-07-18 NOTE — Progress Notes (Signed)
The patient is aware of her results.  

## 2012-08-15 ENCOUNTER — Other Ambulatory Visit: Payer: Self-pay | Admitting: Internal Medicine

## 2012-08-15 NOTE — Telephone Encounter (Signed)
rx sent to pharmacy by e-script  

## 2012-09-08 ENCOUNTER — Other Ambulatory Visit: Payer: Self-pay | Admitting: Internal Medicine

## 2012-09-08 DIAGNOSIS — I10 Essential (primary) hypertension: Secondary | ICD-10-CM

## 2012-09-08 MED ORDER — METOPROLOL SUCCINATE ER 25 MG PO TB24: ORAL_TABLET | ORAL | Status: DC

## 2012-09-08 MED ORDER — AMLODIPINE BESYLATE 5 MG PO TABS: 5.0000 mg | ORAL_TABLET | Freq: Every day | ORAL | Status: DC

## 2012-09-08 NOTE — Telephone Encounter (Signed)
refill x 2 Amlodipine & metoprolol -- requesting 90 day supply on both, last ov 7.31.13 acute  1-AmLODIPine Besylate (Tab) 5 MG Take 1 tablet (5 mg total) by mouth daily last wrt 5.10.13 30 wr/5-refills  2-Metoprolol Succinate ER Tabs 25 MG TAKE 1 TABLET BY MOUTH TWICE A DAY last wrt 9.6.13 #60 wt 1-refill

## 2012-10-06 ENCOUNTER — Ambulatory Visit (INDEPENDENT_AMBULATORY_CARE_PROVIDER_SITE_OTHER): Payer: BC Managed Care – PPO | Admitting: Family Medicine

## 2012-10-06 ENCOUNTER — Ambulatory Visit (INDEPENDENT_AMBULATORY_CARE_PROVIDER_SITE_OTHER)
Admission: RE | Admit: 2012-10-06 | Discharge: 2012-10-06 | Disposition: A | Discharge status: Home / Self Care | Payer: BC Managed Care – PPO | Source: Ambulatory Visit | Attending: Family Medicine | Admitting: Family Medicine

## 2012-10-06 ENCOUNTER — Encounter: Payer: Self-pay | Admitting: Family Medicine

## 2012-10-06 VITALS — BP 125/76 | HR 100 | Temp 98.6°F | Ht 65.0 in | Wt 196.0 lb

## 2012-10-06 DIAGNOSIS — R509 Fever, unspecified: Secondary | ICD-10-CM | POA: Insufficient documentation

## 2012-10-06 DIAGNOSIS — M436 Torticollis: Secondary | ICD-10-CM

## 2012-10-06 MED ORDER — HYDROCODONE-ACETAMINOPHEN 5-500 MG PO TABS: 1.0000 | ORAL_TABLET | Freq: Three times a day (TID) | ORAL | Status: DC | PRN

## 2012-10-06 MED ORDER — CYCLOBENZAPRINE HCL 10 MG PO TABS: 10.0000 mg | ORAL_TABLET | Freq: Three times a day (TID) | ORAL | Status: DC | PRN

## 2012-10-06 MED ORDER — PREDNISONE 10 MG PO TABS: ORAL_TABLET | ORAL | Status: DC

## 2012-10-06 NOTE — Progress Notes (Signed)
  Subjective:    Patient ID: Morgan Andrews, female    DOB: 11-24-1961, 51 y.o.   MRN: [id number]  HPI sxs started 3 days ago w/ sore throat and stiff neck.  Most notable when swallowing and only pain on R side.  Has gradually worsened to the point that pt has limited neck motion in all planes.  Having some tingling in R hand in thumb and 1st finger.  No change in pillow or sleeping arrangements, no change in activity level.  Tm 101 yesterday.  'little bit of headache' yesterday.  R ear pain yesterday, today bilateral.  No facial pain/pressure.  No nasal congestion.  + body aches started today.  No one at home w/ similar sxs.  No cough.   Review of Systems For ROS see HPI     Objective:   Physical Exam  Vitals reviewed. Constitutional: She is oriented to person, place, and time. She appears well-developed and well-nourished. No distress.       No distress or discomfort noted until pt attempts to move neck  HENT:  Head: Normocephalic and atraumatic.  Nose: Nose normal.  Mouth/Throat: Oropharynx is clear and moist. No oropharyngeal exudate.       TMs normal bilaterally No TTP over sinuses  Eyes: Conjunctivae normal and EOM are normal. Pupils are equal, round, and reactive to light.  Neck: No thyromegaly present.       Pt w/ very limited flexion/extension Good rotation to R, very limited to L Unable to tilt ear to shoulder bilaterally  Cardiovascular: Normal rate, regular rhythm and normal heart sounds.   Pulmonary/Chest: Effort normal and breath sounds normal. No respiratory distress. She has no wheezes. She has no rales.  Musculoskeletal:       + Spurling's Bilateral trap spasm, R>L  Lymphadenopathy:    She has no cervical adenopathy.  Neurological: She is alert and oriented to person, place, and time. She has normal reflexes. No cranial nerve deficit.  Skin: Skin is warm and dry.  Psychiatric: She has a normal mood and affect. Her behavior is normal. Thought content normal.           Assessment & Plan:

## 2012-10-06 NOTE — Patient Instructions (Addendum)
Please go to [facility] and get your neck xrays Start the Prednisone as directed- take w/ food Use the flexeril (muscle relaxer) at night Use the Vicodin for pain- may make you drowsy HEATING PAD for pain relief If no improvement in 3-4 days- call! If worsening go to the ER! Call with any questions or concerns Hang in there!!!

## 2012-10-06 NOTE — Assessment & Plan Note (Signed)
New.  Appears to be unrelated to low grade fever caused by presumed viral illness but cannot completely exclude viral meningitis.  Given trap spasm, + spurling's, hx of degenerative disc disease and fusion of neck- this appears to be more musculoskeletal in nature.  Start pred taper, muscle relaxer, and pain meds prn.  If no improvement in 3-4 days, pt to call and will refer to neuro surg.  Get xray to r/o immediate problem.  If sxs worsen, pt to go to ER immediately.  Pt expressed understanding and is in agreement w/ plan.

## 2012-10-06 NOTE — Assessment & Plan Note (Signed)
New.  Normal temp in office today but pt taking tylenol.  No distress or evidence of bacterial infxn.  At this time appears that viral illness and stiff neck are coincidental and not related.  Must consider viral meningitis- will get CBC to assess although pt w/out discomfort w/ moving legs or spine freely.

## 2012-10-07 ENCOUNTER — Encounter: Payer: Self-pay | Admitting: Family Medicine

## 2012-10-07 LAB — CBC WITH DIFFERENTIAL/PLATELET
Basophils Absolute: 0.1 10*3/uL (ref 0.0–0.1)
Basophils Relative: 0.7 % (ref 0.0–3.0)
Eosinophils Absolute: 0.2 10*3/uL (ref 0.0–0.7)
Eosinophils Relative: 1.8 % (ref 0.0–5.0)
HCT: 40.2 % (ref 36.0–46.0)
Hemoglobin: 13 g/dL (ref 12.0–15.0)
Lymphocytes Relative: 23.7 % (ref 12.0–46.0)
Lymphs Abs: 2.5 10*3/uL (ref 0.7–4.0)
MCHC: 32.3 g/dL (ref 30.0–36.0)
MCV: 87.7 fl (ref 78.0–100.0)
Monocytes Absolute: 0.7 10*3/uL (ref 0.1–1.0)
Monocytes Relative: 7 % (ref 3.0–12.0)
Neutro Abs: 7 10*3/uL (ref 1.4–7.7)
Neutrophils Relative %: 66.8 % (ref 43.0–77.0)
Platelets: 226 10*3/uL (ref 150.0–400.0)
RBC: 4.59 Mil/uL (ref 3.87–5.11)
RDW: 16 % — ABNORMAL HIGH (ref 11.5–14.6)
WBC: 10.5 10*3/uL (ref 4.5–10.5)

## 2012-10-07 LAB — SEDIMENTATION RATE: Sed Rate: 37 mm/hr — ABNORMAL HIGH (ref 0–22)

## 2012-10-07 NOTE — Telephone Encounter (Signed)
Please advise 

## 2012-10-09 NOTE — Telephone Encounter (Signed)
Please review

## 2012-10-17 ENCOUNTER — Encounter: Payer: Self-pay | Admitting: Family Medicine

## 2012-10-22 NOTE — Telephone Encounter (Signed)
Faxed over copy of report, advise Pt.

## 2012-10-28 ENCOUNTER — Telehealth: Payer: Self-pay

## 2012-10-28 ENCOUNTER — Other Ambulatory Visit (INDEPENDENT_AMBULATORY_CARE_PROVIDER_SITE_OTHER): Payer: BC Managed Care – PPO

## 2012-10-28 DIAGNOSIS — M542 Cervicalgia: Secondary | ICD-10-CM

## 2012-10-28 DIAGNOSIS — M5412 Radiculopathy, cervical region: Secondary | ICD-10-CM

## 2012-10-28 LAB — BUN: BUN: 9 mg/dL (ref 6–23)

## 2012-10-28 LAB — CREATININE, SERUM: Creatinine, Ser: 0.8 mg/dL (ref 0.4–1.2)

## 2012-10-28 NOTE — Telephone Encounter (Signed)
Spoke with patient, patient was here this am to have kidney function test

## 2012-10-28 NOTE — Telephone Encounter (Signed)
Message copied by Maurice Small on Tue Oct 28, 2012  2:06 PM ------      Message from: Pecola Lawless      Created: Tue Oct 28, 2012 12:26 PM       Please  schedule  Labs : BUN , creat . Codes: V72.84 (pre op), 995.20

## 2012-11-04 ENCOUNTER — Other Ambulatory Visit: Payer: Self-pay

## 2012-11-04 NOTE — Telephone Encounter (Signed)
Hopp please advise on refill request for Savella, Last OV with you was 04/2012

## 2012-11-04 NOTE — Telephone Encounter (Signed)
Who is prescribing physician for [facility] ?

## 2012-11-05 MED ORDER — MILNACIPRAN HCL 50 MG PO TABS: 50.0000 mg | ORAL_TABLET | Freq: Every day | ORAL | Status: DC

## 2012-11-05 NOTE — Telephone Encounter (Signed)
Spoke with patient to verify instruction and dose, patient taking 50 mg 1 by mouth daily. RX manually faxed to back to [facility] at [phone]

## 2012-11-05 NOTE — Telephone Encounter (Signed)
Refill X 90 days with 1 refill

## 2012-11-05 NOTE — Telephone Encounter (Signed)
Dr.Dohmeier originally rx'ed for patient, patient will no longer see Dr.Dohmeier and would like for you to rx

## 2013-01-24 ENCOUNTER — Other Ambulatory Visit: Payer: Self-pay

## 2013-02-17 ENCOUNTER — Other Ambulatory Visit: Payer: Self-pay | Admitting: Internal Medicine

## 2013-05-18 ENCOUNTER — Other Ambulatory Visit: Payer: Self-pay | Admitting: Internal Medicine

## 2013-05-18 NOTE — Telephone Encounter (Signed)
Patient needs to schedule a CPX  

## 2013-05-27 ENCOUNTER — Ambulatory Visit (INDEPENDENT_AMBULATORY_CARE_PROVIDER_SITE_OTHER): Payer: BC Managed Care – PPO | Admitting: Family Medicine

## 2013-05-27 ENCOUNTER — Encounter: Payer: Self-pay | Admitting: Family Medicine

## 2013-05-27 VITALS — BP 140/90 | HR 75 | Temp 98.8°F | Wt 188.2 lb

## 2013-05-27 DIAGNOSIS — R609 Edema, unspecified: Secondary | ICD-10-CM

## 2013-05-27 LAB — BASIC METABOLIC PANEL
BUN: 11 mg/dL (ref 6–23)
CO2: 30 mEq/L (ref 19–32)
Calcium: 8.6 mg/dL (ref 8.4–10.5)
Chloride: 99 mEq/L (ref 96–112)
Creatinine, Ser: 0.9 mg/dL (ref 0.4–1.2)
GFR: 71.85 mL/min (ref >60.00)
Glucose, Bld: 102 mg/dL — ABNORMAL HIGH (ref 70–99)
Potassium: 3 mEq/L — ABNORMAL LOW (ref 3.5–5.1)
Sodium: 139 mEq/L (ref 135–145)

## 2013-05-27 LAB — CBC WITH DIFFERENTIAL/PLATELET
Basophils Absolute: 0.1 10*3/uL (ref 0.0–0.1)
Basophils Relative: 1 % (ref 0.0–3.0)
Eosinophils Absolute: 0.3 10*3/uL (ref 0.0–0.7)
Eosinophils Relative: 3.4 % (ref 0.0–5.0)
HCT: 39.3 % (ref 36.0–46.0)
Hemoglobin: 12.8 g/dL (ref 12.0–15.0)
Lymphocytes Relative: 30.5 % (ref 12.0–46.0)
Lymphs Abs: 2.3 10*3/uL (ref 0.7–4.0)
MCHC: 32.6 g/dL (ref 30.0–36.0)
MCV: 86.3 fl (ref 78.0–100.0)
Monocytes Absolute: 0.5 10*3/uL (ref 0.1–1.0)
Monocytes Relative: 7 % (ref 3.0–12.0)
Neutro Abs: 4.3 10*3/uL (ref 1.4–7.7)
Neutrophils Relative %: 58.1 % (ref 43.0–77.0)
Platelets: 217 10*3/uL (ref 150.0–400.0)
RBC: 4.55 Mil/uL (ref 3.87–5.11)
RDW: 16.1 % — ABNORMAL HIGH (ref 11.5–14.6)
WBC: 7.4 10*3/uL (ref 4.5–10.5)

## 2013-05-27 LAB — HEPATIC FUNCTION PANEL
ALT: 30 U/L (ref 0–35)
AST: 26 U/L (ref 0–37)
Albumin: 4 g/dL (ref 3.5–5.2)
Alkaline Phosphatase: 62 U/L (ref 39–117)
Bilirubin, Direct: 0 mg/dL (ref 0.0–0.3)
Total Bilirubin: 0.5 mg/dL (ref 0.3–1.2)
Total Protein: 7.5 g/dL (ref 6.0–8.3)

## 2013-05-27 LAB — TSH: TSH: 1.41 u[IU]/mL (ref 0.35–5.50)

## 2013-05-27 MED ORDER — FUROSEMIDE 20 MG PO TABS: 20.0000 mg | ORAL_TABLET | Freq: Every day | ORAL | Status: DC

## 2013-05-27 NOTE — Patient Instructions (Addendum)
Follow up in 1 month to recheck swelling and potassium We'll notify you of your lab results and make any changes if needed Drink plenty of fluids Elevate your legs as much as possible Wear good supportive shoes to prevent swelling Call with any questions or concerns Hang in there!

## 2013-05-27 NOTE — Progress Notes (Signed)
  Subjective:    Patient ID: Morgan Andrews, female    DOB: [birth year], 52 y.o.   MRN: [id number]  HPI Edema- pt reports this is primarily in L foot but also in R.  Recently noticed that L knee is also swelling- not a continuation of the foot/ankle swelling but separate.  sxs started 6-8 months ago.  Pt is reporting pitting by the end of the day.  Stopped Savella around time sxs started and also started Amlodipine but sxs predated the amlodipine.  No change in footwear.  Swelling is not painful unless foot is flexed.  Walking nightly.  Swelling will improve w/ elevation.  No CP, SOB, hand edema, cough.   Review of Systems For ROS see HPI      Objective:   Physical Exam  Vitals reviewed. Constitutional: She is oriented to person, place, and time. She appears well-developed and well-nourished. No distress.  HENT:  Head: Normocephalic and atraumatic.  Eyes: Conjunctivae and EOM are normal. Pupils are equal, round, and reactive to light.  Neck: Normal range of motion. Neck supple. No thyromegaly present.  Cardiovascular: Normal rate, regular rhythm, normal heart sounds and intact distal pulses.   No murmur heard. Pulmonary/Chest: Effort normal and breath sounds normal. No respiratory distress.  Abdominal: Soft. She exhibits no distension. There is no tenderness.  Musculoskeletal: She exhibits no edema (no edema of either lower leg or foot).  Full ROM of L knee w/out pain or swelling  Lymphadenopathy:    She has no cervical adenopathy.  Neurological: She is alert and oriented to person, place, and time.  Skin: Skin is warm and dry.  Psychiatric: She has a normal mood and affect. Her behavior is normal.          Assessment & Plan:

## 2013-05-27 NOTE — Assessment & Plan Note (Signed)
New.  No obvious cause on PE.  No evidence of ascites, pulm edema.  Suspect this is multifactorial- Na intake, heat, poor footwear choices, venous incompetence.  Check labs to r/o underlying metabolic causes.  Start lasix prn.  Reviewed supportive care and red flags that should prompt return.  Pt expressed understanding and is in agreement w/ plan.

## 2013-05-28 ENCOUNTER — Other Ambulatory Visit: Payer: Self-pay | Admitting: Family Medicine

## 2013-05-28 ENCOUNTER — Encounter: Payer: Self-pay | Admitting: Family Medicine

## 2013-05-28 MED ORDER — POTASSIUM CHLORIDE ER 20 MEQ PO TBCR: 1.0000 | EXTENDED_RELEASE_TABLET | Freq: Every day | ORAL | Status: DC

## 2013-06-26 ENCOUNTER — Ambulatory Visit (INDEPENDENT_AMBULATORY_CARE_PROVIDER_SITE_OTHER): Payer: BC Managed Care – PPO | Admitting: Family Medicine

## 2013-06-26 ENCOUNTER — Encounter: Payer: Self-pay | Admitting: Family Medicine

## 2013-06-26 VITALS — BP 124/80 | HR 77 | Temp 98.8°F | Wt 188.6 lb

## 2013-06-26 DIAGNOSIS — I10 Essential (primary) hypertension: Secondary | ICD-10-CM

## 2013-06-26 DIAGNOSIS — R609 Edema, unspecified: Secondary | ICD-10-CM

## 2013-06-26 LAB — BASIC METABOLIC PANEL
BUN: 12 mg/dL (ref 6–23)
CO2: 29 mEq/L (ref 19–32)
Calcium: 9.8 mg/dL (ref 8.4–10.5)
Chloride: 101 mEq/L (ref 96–112)
Creatinine, Ser: 1 mg/dL (ref 0.4–1.2)
GFR: 64.19 mL/min (ref >60.00)
Glucose, Bld: 85 mg/dL (ref 70–99)
Potassium: 3.8 mEq/L (ref 3.5–5.1)
Sodium: 140 mEq/L (ref 135–145)

## 2013-06-26 MED ORDER — LOSARTAN POTASSIUM 50 MG PO TABS: 50.0000 mg | ORAL_TABLET | Freq: Every day | ORAL | Status: DC

## 2013-06-26 NOTE — Addendum Note (Signed)
Addended by: Verdie Shire on: 06/26/2013 10:43 AM   Modules accepted: Orders

## 2013-06-26 NOTE — Assessment & Plan Note (Signed)
Adequate control.  Asymptomatic.  Will stop Amlodipine due to edema.  Start ARB for BP control.  Follow up w/ PCP to assess BP control and check BMP.

## 2013-06-26 NOTE — Assessment & Plan Note (Signed)
Still no evidence of edema on PE but pt reports sxs worsen as day goes on.  Stop amlodipine.  Continue lasix prn.  Recheck BMP due to hypokalemia.

## 2013-06-26 NOTE — Patient Instructions (Addendum)
Follow up w/ Dr Alwyn Ren in 1 month to recheck BP STOP the amlodipine START the Losartan daily Continue the lasix as needed Regular exercise, elevation when sitting, and continued water intake Call with any questions or concerns Enjoy your summer!!

## 2013-06-26 NOTE — Progress Notes (Signed)
  Subjective:    Patient ID: Morgan Andrews, female    DOB: 08-Aug-1961, 52 y.o.   MRN: [id number]  HPI Edema- pt reports swelling only slightly improved w/ addition of lasix at last visit.  Continues to experience impressive swelling as day goes on.  No swelling of hands, face, no SOB, CP.  Is on amlodipine.   Review of Systems For ROS see HPI     Objective:   Physical Exam  Vitals reviewed. Constitutional: She is oriented to person, place, and time. She appears well-developed and well-nourished. No distress.  Cardiovascular: Normal rate, regular rhythm, normal heart sounds and intact distal pulses.   Pulmonary/Chest: Effort normal and breath sounds normal. No respiratory distress. She has no wheezes. She has no rales.  Musculoskeletal: She exhibits no edema.  Neurological: She is alert and oriented to person, place, and time.          Assessment & Plan:

## 2013-06-30 ENCOUNTER — Encounter: Payer: Self-pay | Admitting: Internal Medicine

## 2013-06-30 NOTE — Telephone Encounter (Signed)
Hopp please advise, not sure if message should be sent to Dr.Tabori who seen patient, if patient should have a follow-up to see your or if this can be handled via mychart.

## 2013-07-01 NOTE — Telephone Encounter (Signed)
Hopp please advise  

## 2013-07-04 ENCOUNTER — Encounter: Payer: Self-pay | Admitting: Internal Medicine

## 2013-07-07 ENCOUNTER — Other Ambulatory Visit (INDEPENDENT_AMBULATORY_CARE_PROVIDER_SITE_OTHER): Payer: BC Managed Care – PPO

## 2013-07-07 DIAGNOSIS — R609 Edema, unspecified: Secondary | ICD-10-CM

## 2013-07-07 LAB — HEPATIC FUNCTION PANEL
ALT: 24 U/L (ref 0–35)
AST: 21 U/L (ref 0–37)
Albumin: 3.7 g/dL (ref 3.5–5.2)
Alkaline Phosphatase: 61 U/L (ref 39–117)
Bilirubin, Direct: 0 mg/dL (ref 0.0–0.3)
Total Bilirubin: 0.3 mg/dL (ref 0.3–1.2)
Total Protein: 7 g/dL (ref 6.0–8.3)

## 2013-07-08 ENCOUNTER — Other Ambulatory Visit: Payer: Self-pay | Admitting: *Deleted

## 2013-07-08 DIAGNOSIS — I10 Essential (primary) hypertension: Secondary | ICD-10-CM

## 2013-07-08 MED ORDER — LOSARTAN POTASSIUM 50 MG PO TABS: 50.0000 mg | ORAL_TABLET | Freq: Every day | ORAL | Status: DC

## 2013-07-10 ENCOUNTER — Ambulatory Visit (INDEPENDENT_AMBULATORY_CARE_PROVIDER_SITE_OTHER): Payer: BC Managed Care – PPO | Admitting: Internal Medicine

## 2013-07-10 ENCOUNTER — Encounter: Payer: Self-pay | Admitting: Internal Medicine

## 2013-07-10 VITALS — BP 118/76 | HR 89 | Wt 190.0 lb

## 2013-07-10 DIAGNOSIS — I10 Essential (primary) hypertension: Secondary | ICD-10-CM

## 2013-07-10 DIAGNOSIS — F419 Anxiety disorder, unspecified: Secondary | ICD-10-CM

## 2013-07-10 DIAGNOSIS — R609 Edema, unspecified: Secondary | ICD-10-CM

## 2013-07-10 DIAGNOSIS — F411 Generalized anxiety disorder: Secondary | ICD-10-CM

## 2013-07-10 MED ORDER — TRAZODONE HCL 50 MG PO TABS: 50.0000 mg | ORAL_TABLET | Freq: Every day | ORAL | Status: DC

## 2013-07-10 NOTE — Progress Notes (Signed)
  Subjective:    Patient ID: Morgan Andrews, female    DOB: [birth year], 52 y.o.   MRN: [id number]  HPI CHRONIC HYPERTENSION follow-up:  Home blood pressure range 117/80-135/82.Patient is compliant with medications. No adverse effects noted from medication. Exercise program as walking 1 mpd 3-4  times per week for 30 minutes. Dietary program of HFCS avoidance &  low-salt diet.  She does have dependent edema at the end of the day which is also associated with an erythematous rash. The edema and rash to resolve overnight         Review of Systems  No chest pain, palpitations, dyspnea, claudication, or paroxysmal nocturnal dyspnea described. No significant lightheadedness, headache, epistaxis, or syncope.  She is seeing Dr. Sandria Manly for anxiety and panic attacks. The regimen he has prescribed has been effective. Additionally she's on other psychotropic drugs, muscle relaxants, and narcotic pain medications from multiple prescribers.      Objective:   Physical Exam    Appears healthy and well-nourished & in no acute distress  Extraocular motion is intact. She has no lid lag or proptosis    No carotid bruits are present.No neck pain distention present at 10 - 15 degrees. Thyroid normal to palpation  Heart rhythm and rate are normal with no significant murmurs or gallops.  Chest is clear with no increased work of breathing  There is no evidence of aortic aneurysm or renal artery bruits  Abdomen soft with no organomegaly or masses. No HJR  No clubbing, cyanosis or edema present.  Pedal pulses are intact   No ischemic skin changes are present . Nails  Healthy w/o oncholysis  Alert and oriented. Strength, tone, DTRs reflexes normal  Mood and affect are normal; interactive and communicative          Assessment & Plan:  #1 hypertension, well-controlled  #2 dependent edema after being upright all day suggesting venous insufficiency  #3 transient rash associated with  the deep tendon edema  #4 polypharmacy with marked increase of drug: drug interaction  Plan: No change indicated on medications for blood pressure. I've asked her to discuss her polypharmacy with Dr. Sandria Manly her psychiatrist. He should determine optimal psychotropic regimen.

## 2013-07-10 NOTE — Patient Instructions (Addendum)
Please review the medication list in the After Visit Summary provided.Please verify the medication name (this may be  brand or generic) & correct dosage. Write the name of the prescribing physician to the right of the medication and share this with all medical staff seen at each appointment. This will help provide continuity of care; help optimize therapeutic interventions;and help prevent drug:drug adverse reaction. Wear over the calf support hose if you are standing for prolonged periods of  time or working on your feet for prolonged periods of time. Share results with all non [facility] medical staff seen .  Decrease the trazodone to 50 mg at bedtime. Please discuss the polypharmacy with Dr. Sandria Manly; he would be most-year-old at provide an optimal psychotropic medical regimen.

## 2013-07-14 ENCOUNTER — Other Ambulatory Visit: Payer: Self-pay | Admitting: *Deleted

## 2013-07-14 MED ORDER — FUROSEMIDE 20 MG PO TABS: 20.0000 mg | ORAL_TABLET | Freq: Every day | ORAL | Status: DC

## 2013-07-26 ENCOUNTER — Encounter: Payer: Self-pay | Admitting: Internal Medicine

## 2013-07-26 DIAGNOSIS — M5090 Cervical disc disorder, unspecified, unspecified cervical region: Secondary | ICD-10-CM | POA: Insufficient documentation

## 2013-07-27 ENCOUNTER — Ambulatory Visit: Payer: BC Managed Care – PPO | Admitting: Internal Medicine

## 2013-08-03 ENCOUNTER — Telehealth: Payer: Self-pay | Admitting: *Deleted

## 2013-08-03 NOTE — Telephone Encounter (Signed)
Refill x1 

## 2013-08-03 NOTE — Telephone Encounter (Signed)
Pharmacy is requesting a refill for Trazodone 50 mg patient has no UDS or contract on file.  Last date filled 07/10/2013 # 30 0-R.  This is a [facility] patient please advise.  Ag cma

## 2013-08-04 ENCOUNTER — Other Ambulatory Visit: Payer: Self-pay | Admitting: *Deleted

## 2013-08-04 MED ORDER — TRAZODONE HCL 50 MG PO TABS: 50.0000 mg | ORAL_TABLET | Freq: Every day | ORAL | Status: DC

## 2013-08-04 NOTE — Telephone Encounter (Signed)
Rx refilled for Tazodone 50mg .  Ag cma

## 2013-08-08 ENCOUNTER — Other Ambulatory Visit: Payer: Self-pay | Admitting: Internal Medicine

## 2013-08-13 LAB — HM MAMMOGRAPHY

## 2013-09-02 ENCOUNTER — Ambulatory Visit (INDEPENDENT_AMBULATORY_CARE_PROVIDER_SITE_OTHER): Payer: BC Managed Care – PPO | Admitting: Internal Medicine

## 2013-09-02 ENCOUNTER — Encounter: Payer: Self-pay | Admitting: Internal Medicine

## 2013-09-02 VITALS — BP 135/86 | HR 94 | Temp 99.0°F | Ht 64.75 in | Wt 197.4 lb

## 2013-09-02 DIAGNOSIS — Z0000 Encounter for general adult medical examination without abnormal findings: Secondary | ICD-10-CM

## 2013-09-02 DIAGNOSIS — R9431 Abnormal electrocardiogram [ECG] [EKG]: Secondary | ICD-10-CM

## 2013-09-02 DIAGNOSIS — Z1331 Encounter for screening for depression: Secondary | ICD-10-CM

## 2013-09-02 DIAGNOSIS — I10 Essential (primary) hypertension: Secondary | ICD-10-CM

## 2013-09-02 DIAGNOSIS — E782 Mixed hyperlipidemia: Secondary | ICD-10-CM

## 2013-09-02 DIAGNOSIS — E785 Hyperlipidemia, unspecified: Secondary | ICD-10-CM | POA: Insufficient documentation

## 2013-09-02 DIAGNOSIS — Z1211 Encounter for screening for malignant neoplasm of colon: Secondary | ICD-10-CM

## 2013-09-02 LAB — LDL CHOLESTEROL, DIRECT: Direct LDL: 128.6 mg/dL

## 2013-09-02 LAB — TSH: TSH: 1.55 u[IU]/mL (ref 0.35–5.50)

## 2013-09-02 LAB — T4, FREE: Free T4: 0.63 ng/dL (ref 0.60–1.60)

## 2013-09-02 LAB — LIPID PANEL
Cholesterol: 227 mg/dL — ABNORMAL HIGH (ref 0–200)
HDL: 86.4 mg/dL (ref >39.00)
Total CHOL/HDL Ratio: 3
Triglycerides: 108 mg/dL (ref 0.0–149.0)
VLDL: 21.6 mg/dL (ref 0.0–40.0)

## 2013-09-02 LAB — HEMOGLOBIN A1C: Hgb A1c MFr Bld: 6.9 % — ABNORMAL HIGH (ref 4.6–6.5)

## 2013-09-02 MED ORDER — TRAZODONE HCL 50 MG PO TABS: 50.0000 mg | ORAL_TABLET | Freq: Every day | ORAL | Status: DC

## 2013-09-02 NOTE — Progress Notes (Signed)
  Subjective:    Patient ID: Morgan Andrews, female    DOB: [birth year], 52 y.o.   MRN: [id number]  HPI  She is here for a physical;acute issues include weight gain post cervical fusion in 12/13.     Review of Systems   Home blood pressure  average 120/80  Patient is compliant with medications  No adverse effects noted from medication  Exercise program as walking 4-5  times per week for 30 minutes  No specific dietary program but no HFCS & low-salt diet    No chest pain, palpitations, dyspnea, claudication,edema or paroxysmal nocturnal dyspnea described  No significant lightheadedness, headache, epistaxis, or syncope           Objective:   Physical Exam Gen.:  well-nourished in appearance. Alert, appropriate and cooperative throughout exam. Head: Normocephalic without obvious abnormalities  Eyes: No corneal or conjunctival inflammation noted. Pupils equal round reactive to light and accommodation.  Extraocular motion intact. Vision grossly normal without  lenses Ears: External  ear exam reveals no significant lesions or deformities. Canals clear .TMs normal. Hearing is grossly normal bilaterally. Nose: External nasal exam reveals no deformity or inflammation. Nasal mucosa are pink and moist. No lesions or exudates noted.   Mouth: Oral mucosa and oropharynx reveal no lesions or exudates. Teeth in good repair. Neck: No deformities, masses, or tenderness noted. Range of motion & Thyroid normal. Lungs: Normal respiratory effort; chest expands symmetrically. Lungs are clear to auscultation without rales, wheezes, or increased work of breathing. Heart: Normal rate and rhythm. Normal S1 and S2. No gallop, click, or rub. S4 w/o murmur. Abdomen: Bowel sounds normal; abdomen soft and nontender. No masses, organomegaly or hernias noted. Faint striae Genitalia: As per Gyn                                  Musculoskeletal/extremities: Slightly accentuated curvature of upper thoracic  spine. No clubbing, cyanosis, edema, or significant extremity  deformity noted. Range of motion normal .Tone & strength  Normal. Joints normal . Nail health good. Able to lie down & sit up w/o help. Negative SLR bilaterally Vascular: Carotid, radial artery, dorsalis pedis and  posterior tibial pulses are full and equal. No bruits present. Neurologic: Alert and oriented x3. Deep tendon reflexes symmetrical and normal.       Skin: Intact without suspicious lesions or rashes. Lymph: No cervical, axillary lymphadenopathy present. Psych: Mood and affect are normal. Normally interactive                                                                                        Assessment & Plan:  #1 comprehensive physical exam; no acute findings #2 weight gain  Plan: see Orders  & Recommendations

## 2013-09-02 NOTE — Patient Instructions (Addendum)
Minimal Blood Pressure Goal= AVERAGE < 140/90;  Ideal is an AVERAGE < 135/85. This AVERAGE should be calculated from @ least 5-7 BP readings taken @ different times of day on different days of week. You should not respond to isolated BP readings , but rather the AVERAGE for that week .Please bring your  blood pressure cuff to office visits to verify that it is reliable.It  can also be checked against the blood pressure device at the pharmacy. Finger or wrist cuffs are not dependable; an arm cuff is. Take the EKG to any emergency room or preop visits. There are nonspecific changes; as long as there is no new change these are not clinically significant . If the old EKG is not available for comparison; it may result in unnecessary hospitalization for observation with significant unnecessary expense.

## 2013-09-05 ENCOUNTER — Other Ambulatory Visit: Payer: Self-pay | Admitting: Internal Medicine

## 2013-09-05 DIAGNOSIS — E119 Type 2 diabetes mellitus without complications: Secondary | ICD-10-CM

## 2013-09-05 DIAGNOSIS — E785 Hyperlipidemia, unspecified: Secondary | ICD-10-CM

## 2013-09-18 ENCOUNTER — Encounter: Payer: Self-pay | Admitting: Internal Medicine

## 2013-09-25 ENCOUNTER — Other Ambulatory Visit: Payer: Self-pay | Admitting: Rehabilitation

## 2013-09-25 DIAGNOSIS — IMO0002 Reserved for concepts with insufficient information to code with codable children: Secondary | ICD-10-CM

## 2013-09-28 ENCOUNTER — Ambulatory Visit
Admission: RE | Admit: 2013-09-28 | Discharge: 2013-09-28 | Disposition: A | Discharge status: Home / Self Care | Payer: BC Managed Care – PPO | Source: Ambulatory Visit | Attending: Rehabilitation | Admitting: Rehabilitation

## 2013-09-28 DIAGNOSIS — IMO0002 Reserved for concepts with insufficient information to code with codable children: Secondary | ICD-10-CM

## 2013-10-03 ENCOUNTER — Other Ambulatory Visit: Payer: Self-pay | Admitting: Internal Medicine

## 2013-10-06 ENCOUNTER — Other Ambulatory Visit: Payer: Self-pay | Admitting: *Deleted

## 2013-10-06 MED ORDER — TRAZODONE HCL 50 MG PO TABS: 50.0000 mg | ORAL_TABLET | Freq: Every day | ORAL | Status: DC

## 2013-10-06 NOTE — Telephone Encounter (Signed)
Trazodone refill sent to [facility]

## 2013-10-15 ENCOUNTER — Other Ambulatory Visit: Payer: Self-pay

## 2013-11-03 ENCOUNTER — Other Ambulatory Visit: Payer: Self-pay | Admitting: Internal Medicine

## 2013-11-03 ENCOUNTER — Other Ambulatory Visit: Payer: Self-pay | Admitting: Orthopaedic Surgery

## 2013-11-03 DIAGNOSIS — S0990XA Unspecified injury of head, initial encounter: Secondary | ICD-10-CM

## 2013-11-03 MED ORDER — TRAZODONE HCL 50 MG PO TABS: ORAL_TABLET | ORAL | Status: DC

## 2013-11-03 NOTE — Telephone Encounter (Signed)
Trazodone refilled per protocol.

## 2013-11-04 ENCOUNTER — Telehealth: Payer: Self-pay | Admitting: *Deleted

## 2013-11-04 ENCOUNTER — Ambulatory Visit
Admission: RE | Admit: 2013-11-04 | Discharge: 2013-11-04 | Disposition: A | Discharge status: Home / Self Care | Payer: BC Managed Care – PPO | Source: Ambulatory Visit | Attending: Orthopaedic Surgery | Admitting: Orthopaedic Surgery

## 2013-11-04 DIAGNOSIS — S0990XA Unspecified injury of head, initial encounter: Secondary | ICD-10-CM

## 2013-11-04 NOTE — Telephone Encounter (Signed)
Patient called and requested an appt with Dr. Alwyn Ren to discuss her MRI results that was ordered from her orthopaedic spine physician. Appt was made for 11/06/13 @ 9 am.

## 2013-11-06 ENCOUNTER — Encounter: Payer: Self-pay | Admitting: Internal Medicine

## 2013-11-06 ENCOUNTER — Ambulatory Visit (INDEPENDENT_AMBULATORY_CARE_PROVIDER_SITE_OTHER): Payer: BC Managed Care – PPO | Admitting: Internal Medicine

## 2013-11-06 ENCOUNTER — Other Ambulatory Visit: Payer: Self-pay | Admitting: Internal Medicine

## 2013-11-06 VITALS — BP 131/83 | HR 99 | Temp 98.0°F | Resp 16 | Wt 199.0 lb

## 2013-11-06 DIAGNOSIS — I619 Nontraumatic intracerebral hemorrhage, unspecified: Secondary | ICD-10-CM

## 2013-11-06 DIAGNOSIS — G510 Bell's palsy: Secondary | ICD-10-CM

## 2013-11-06 DIAGNOSIS — D649 Anemia, unspecified: Secondary | ICD-10-CM

## 2013-11-06 DIAGNOSIS — M5090 Cervical disc disorder, unspecified, unspecified cervical region: Secondary | ICD-10-CM

## 2013-11-06 LAB — CBC WITH DIFFERENTIAL/PLATELET
Basophils Absolute: 0.1 10*3/uL (ref 0.0–0.1)
Basophils Relative: 1.2 % (ref 0.0–3.0)
Eosinophils Absolute: 0.7 10*3/uL (ref 0.0–0.7)
Eosinophils Relative: 8.9 % — ABNORMAL HIGH (ref 0.0–5.0)
HCT: 35.5 % — ABNORMAL LOW (ref 36.0–46.0)
Hemoglobin: 11.5 g/dL — ABNORMAL LOW (ref 12.0–15.0)
Lymphocytes Relative: 22.3 % (ref 12.0–46.0)
Lymphs Abs: 1.7 10*3/uL (ref 0.7–4.0)
MCHC: 32.4 g/dL (ref 30.0–36.0)
MCV: 85.5 fl (ref 78.0–100.0)
Monocytes Absolute: 0.6 10*3/uL (ref 0.1–1.0)
Monocytes Relative: 8.6 % (ref 3.0–12.0)
Neutro Abs: 4.4 10*3/uL (ref 1.4–7.7)
Neutrophils Relative %: 59 % (ref 43.0–77.0)
Platelets: 194 10*3/uL (ref 150.0–400.0)
RBC: 4.15 Mil/uL (ref 3.87–5.11)
RDW: 17.6 % — ABNORMAL HIGH (ref 11.5–14.6)
WBC: 7.5 10*3/uL (ref 4.5–10.5)

## 2013-11-06 NOTE — Patient Instructions (Signed)
Your next office appointment will be determined based upon review of your pending labs . Those instructions will be transmitted to you through My Chart  . Followup as needed for your this acute issue. Please report any significant change in your symptoms. Avoid excess aspirin , ibuprofen, naproxen etc .Repeat blood count if  any abnormal bruising or bleeding occur.

## 2013-11-06 NOTE — Assessment & Plan Note (Signed)
As per Dr Noel Gerold

## 2013-11-06 NOTE — Assessment & Plan Note (Signed)
Avoid  aspirin , ibuprofen, naproxen etc . CBC & dif

## 2013-11-06 NOTE — Assessment & Plan Note (Signed)
Neurology referral 

## 2013-11-06 NOTE — Progress Notes (Signed)
   Subjective:    Patient ID: Morgan Andrews, female    DOB: Aug 13, 1961, 52 y.o.   MRN: [id number]  HPI  Her complex recent history was reviewed. She had posterior cervical spine surgical procedure 10/05/13. Approximately 2 weeks later she developed right facial neurologic deficit. 6 days of oral steroids was of no benefit. MRI 11/04/13 was negative except for "small brain bleed". They were referred to the primary care doctor or further evaluation.    Review of Systems Epistaxis, hemoptysis, hematuria, melena, or rectal bleeding denied.There has been  no unexplained weight loss or abdominal pain. Also no abnormal bruising or bleeding and no difficulty stopping bleeding with injury. Some dysphagia intermittently; colonoscopy pending.     Objective:   Physical Exam Gen.:  well-nourished in appearance. Alert, appropriate and cooperative throughout exam. Head: Normocephalic without obvious abnormalities  Eyes: No corneal or conjunctival inflammation noted. Pupils equal round reactive to light and accommodation. Extraocular motion intact but intermittent lateral deviation in OD with upward gaze. Slight ptosis OD. Vision intact. FOV normal Ears: External  ear exam reveals no significant lesions or deformities. Some wax bilaterally. Hearing is grossly normal Nose: External nasal exam reveals no deformity or inflammation. Nasal mucosa are pink and moist. No lesions or exudates noted.   Mouth: Oral mucosa and oropharynx reveal no lesions or exudates. Teeth in good repair. Neck: No deformities, masses, or tenderness noted. Range of motion decreased. Thyroid normal. Lungs: Normal respiratory effort; chest expands symmetrically. Lungs are clear to auscultation without rales, wheezes, or increased work of breathing. Heart: Normal rate and rhythm. Normal S1 and S2. No gallop, click, or rub. S4 w/o murmur.                                  Musculoskeletal/extremities:  No clubbing, cyanosis, edema, or  significant extremity  deformity noted. Tone & strength normal. Hand joints normal . Fingernail  health good.  Vascular: Carotid, radial artery pulses are full and equal. No bruits present. Neurologic: Alert and oriented x3. Deep tendon reflexes symmetrical and normal.  Gait normal . Decrease sensation to light touch over the right forehead. Decreased excursion of the right eyebrow. No definite asymmetry in leg strength. Subtle asymmetry of the nasolabial folds. Skin: Intact without suspicious lesions or rashes. Lymph: No cervical, axillary lymphadenopathy present. Psych: Mood and affect are normal. Normally interactive                                                                                        Assessment & Plan:  See Current Assessment & Plan in Problem List under specific Diagnosis

## 2013-11-09 ENCOUNTER — Other Ambulatory Visit (INDEPENDENT_AMBULATORY_CARE_PROVIDER_SITE_OTHER): Payer: BC Managed Care – PPO

## 2013-11-09 DIAGNOSIS — D649 Anemia, unspecified: Secondary | ICD-10-CM

## 2013-11-09 LAB — CBC WITH DIFFERENTIAL/PLATELET
Basophils Absolute: 0.1 K/uL (ref 0.0–0.1)
Basophils Relative: 1 % (ref 0.0–3.0)
Eosinophils Absolute: 0.7 K/uL (ref 0.0–0.7)
Eosinophils Relative: 6.8 % — ABNORMAL HIGH (ref 0.0–5.0)
HCT: 36.5 % (ref 36.0–46.0)
Hemoglobin: 11.9 g/dL — ABNORMAL LOW (ref 12.0–15.0)
Lymphocytes Relative: 24.5 % (ref 12.0–46.0)
Lymphs Abs: 2.4 K/uL (ref 0.7–4.0)
MCHC: 32.6 g/dL (ref 30.0–36.0)
MCV: 85.8 fl (ref 78.0–100.0)
Monocytes Absolute: 0.8 K/uL (ref 0.1–1.0)
Monocytes Relative: 7.9 % (ref 3.0–12.0)
Neutro Abs: 5.9 K/uL (ref 1.4–7.7)
Neutrophils Relative %: 59.8 % (ref 43.0–77.0)
Platelets: 211 K/uL (ref 150.0–400.0)
RBC: 4.25 Mil/uL (ref 3.87–5.11)
RDW: 17.6 % — ABNORMAL HIGH (ref 11.5–14.6)
WBC: 9.9 K/uL (ref 4.5–10.5)

## 2013-11-09 LAB — IBC PANEL
Iron: 36 ug/dL — ABNORMAL LOW (ref 42–145)
Saturation Ratios: 9.5 % — ABNORMAL LOW (ref 20.0–50.0)
Transferrin: 269.8 mg/dL (ref 212.0–360.0)

## 2013-11-09 LAB — VITAMIN B12: Vitamin B-12: 517 pg/mL (ref 211–911)

## 2013-11-10 ENCOUNTER — Encounter: Payer: Self-pay | Admitting: Internal Medicine

## 2013-11-10 NOTE — Telephone Encounter (Signed)
Please advise 

## 2013-11-12 ENCOUNTER — Ambulatory Visit (AMBULATORY_SURGERY_CENTER): Payer: BC Managed Care – PPO

## 2013-11-12 VITALS — Ht 65.0 in | Wt 190.0 lb

## 2013-11-12 DIAGNOSIS — Z1211 Encounter for screening for malignant neoplasm of colon: Secondary | ICD-10-CM

## 2013-11-12 MED ORDER — MOVIPREP 100 G PO SOLR: 1.0000 | Freq: Once | ORAL | Status: DC

## 2013-11-13 ENCOUNTER — Encounter: Payer: Self-pay | Admitting: Internal Medicine

## 2013-11-17 ENCOUNTER — Ambulatory Visit (INDEPENDENT_AMBULATORY_CARE_PROVIDER_SITE_OTHER): Payer: BC Managed Care – PPO | Admitting: Neurology

## 2013-11-17 ENCOUNTER — Encounter: Payer: Self-pay | Admitting: Neurology

## 2013-11-17 VITALS — BP 138/80 | HR 88 | Temp 98.2°F | Ht 65.0 in | Wt 200.0 lb

## 2013-11-17 DIAGNOSIS — M531 Cervicobrachial syndrome: Secondary | ICD-10-CM

## 2013-11-17 DIAGNOSIS — G510 Bell's palsy: Secondary | ICD-10-CM

## 2013-11-17 DIAGNOSIS — M5481 Occipital neuralgia: Secondary | ICD-10-CM

## 2013-11-17 DIAGNOSIS — R930 Abnormal findings on diagnostic imaging of skull and head, not elsewhere classified: Secondary | ICD-10-CM

## 2013-11-17 NOTE — Progress Notes (Signed)
NEUROLOGY CONSULTATION NOTE  Morgan Andrews MRN: [id number] DOB: [birth year]  Referring provider: Dr. Alwyn Ren Primary care provider: Dr. Alwyn Ren  Reason for consult:  Abnormal MRI, Bell's palsy  HISTORY OF PRESENT ILLNESS: Morgan Andrews is a 52 year old left-handed woman with history of hypertension, cervical disc disease status post cervical fusion, type II diabetes mellitus, hyperlipidemia, and anxiety who presents for Bell's palsy and abnormal MRI of brain.  She is accompanied by her husband.  Records and images were personally reviewed where available.   She underwent cervical fusion at C4-C7 with use of halo on 10/05/13.  About 2 weeks later, she noticed that she couldn't raise her right eyebrow.  She also noted some numbness above her right eye and a headache from the right eye to the right temple and back of the ear.  She is able to close the eye and it does not remain open at night.  She has not really noticed any visual changes (maybe a little blurred vision).  She did not exhibit any lower facial droop.  She did not have any ear pain or altered taste sensation.  She also had dull bilateral retro-orbital pain as well.  She was given a prednisone taper by her primary care provider, Dr. Alwyn Ren.  Her orthopedic spine surgeon had ordered an MRI of the brain without contrast, performed on 11/04/13, which revealed tiny punctate chronic micro-hemorrhage in the right corona radiata.  No posterior fossa or brainstem lesion seen.  Dr. Alwyn Ren, advised her to avoid aspirin and NSAIDs.   She has not noticed any improvement in the upper facial weakness.  She still has left-sided headache as well.  It occurs about every other day.  It is not associated with nausea, vomiting, photophobia or phonophobia.  She took Tylenol a couple of times, but overall has not taken any medication for the pain.  She had some left arm weakness prior to the surgery which somewhat improved afterwards.  She does have  tingling and numbness in the fingers of both hands, particularly the thumbs.  She notices it at night in bed, or while driving (prior to surgery).  Recent labs:  Hgb A1c 6.9, TSH 1.55, LDL 128.6 CT cervical spine (09/28/13): post-surgical changes from fusion at C4-C7 with anterior plate and screws.  PAST MEDICAL HISTORY: Past Medical History  Diagnosis Date  . Asthma   . Eczema   . Seasonal allergies   . Hiatal hernia   . Anxiety     classic Panic attacks with sweating & chest pain  . Cervical radiculopathy     C5-6    PAST SURGICAL HISTORY: Past Surgical History  Procedure Laterality Date  . Ankle surgery  1999-2002    X 5, Dr Eulah Pont  . Tonsillectomy and adenoidectomy    . Cervical fusion  2008    fusion, discectomy & plating , Dr Lovell Sheehan  . Wrist surgery    . Upper gastrointestinal endoscopy  2007    negative  . No colonoscopy    . Partial hysterectomy      MEDICATIONS: Current Outpatient Prescriptions on File Prior to Visit  Medication Sig Dispense Refill  . amitriptyline (ELAVIL) 25 MG tablet Take 1 tablet by mouth at bedtime.      . clonazePAM (KLONOPIN) 0.5 MG tablet Take 1 tablet (0.5 mg total) by mouth at bedtime as needed.  90 tablet  0  . cyclobenzaprine (FLEXERIL) 10 MG tablet Take 1 tablet (10 mg total) by mouth 3 (  three) times daily as needed for muscle spasms.  30 tablet  0  . HYDROcodone-acetaminophen (VICODIN) 5-500 MG per tablet Take 1 tablet by mouth every 8 (eight) hours as needed for pain.  20 tablet  0  . losartan (COZAAR) 50 MG tablet Take 1 tablet (50 mg total) by mouth daily.  90 tablet  1  . metoprolol succinate (TOPROL-XL) 25 MG 24 hr tablet Take 1 tablet twice daily  180 tablet  3  . traZODone (DESYREL) 50 MG tablet Take 1 tablet (50 mg total) by mouth at bedtime.  90 tablet  0  . Vilazodone HCl (VIIBRYD) 20 MG TABS Take 1 mg by mouth daily.      Marland Kitchen MOVIPREP 100 G SOLR Take 1 kit (200 g total) by mouth once.  1 kit  0   No current  facility-administered medications on file prior to visit.    ALLERGIES: Allergies  Allergen Reactions  . Clemastine Fumarate     REACTION: ASTHMA ATTACK( Note: Tavist D caused asthma flare from" excessive drying")  . Lyrica [Pregabalin]     Pedal edema    FAMILY HISTORY: Family History  Problem Relation Age of Onset  . Adopted: Yes  . Asthma Mother   . Hypertension Mother   . Anxiety disorder Mother     with panic attacks & depression  . Depression Mother   . Hypertension Maternal Aunt   . Stroke Maternal Aunt     >65  . Rheum arthritis Maternal Aunt   . Anxiety disorder Sister     as above  . Diabetes Neg Hx   . Colon cancer Neg Hx     SOCIAL HISTORY: History   Social History  . Marital Status: Married    Spouse Name: N/A    Number of Children: N/A  . Years of Education: N/A   Occupational History  . Not on file.   Social History Main Topics  . Smoking status: Never Smoker   . Smokeless tobacco: Never Used  . Alcohol Use: No  . Drug Use: No  . Sexual Activity: Not on file   Other Topics Concern  . Not on file   Social History Narrative  . No narrative on file    REVIEW OF SYSTEMS: Constitutional: No fevers, chills, or sweats, no generalized fatigue, change in appetite Eyes: No visual changes, double vision, eye pain Ear, nose and throat: No hearing loss, ear pain, nasal congestion, sore throat Cardiovascular: No chest pain, palpitations Respiratory:  No shortness of breath at rest or with exertion, wheezes GastrointestinaI: No nausea, vomiting, diarrhea, abdominal pain, fecal incontinence Genitourinary:  No dysuria, urinary retention or frequency Musculoskeletal:  Neck pain, no back pain Integumentary: No rash, pruritus, skin lesions Neurological: as above Psychiatric: No depression, insomnia, anxiety Endocrine: No palpitations, fatigue, diaphoresis, mood swings, change in appetite, change in weight, increased thirst Hematologic/Lymphatic:  No  anemia, purpura, petechiae. Allergic/Immunologic: no itchy/runny eyes, nasal congestion, recent allergic reactions, rashes  PHYSICAL EXAM: Filed Vitals:   11/17/13 1041  BP: 138/80  Pulse: 88  Temp: 98.2 F (36.8 C)   General: No acute distress Head:  Normocephalic/atraumatic, tenderness to palpation of the right suboccipital region (at the notch). Neck: Wearing neck brace Back: No paraspinal tenderness. Heart: regular rate and rhythm Lungs: Clear to auscultation bilaterally. Vascular: No carotid bruits. Neurological Exam: Mental status: alert and oriented to person, place, and time, speech fluent and not dysarthric, language intact. Cranial nerves: CN I: not tested CN II: pupils  equal, round and reactive to light, visual fields intact, fundi unremarkable. CN III, IV, VI:  full range of motion, no nystagmus, no ptosis CN V: Endorses reduced sensation above right eye. CN VII: Right forehead weakness.  Lower face symmetric CN VIII: hearing intact CN IX, X: gag intact, uvula midline CN XI: sternocleidomastoid and trapezius muscles intact CN XII: tongue midline Bulk & Tone: normal, no fasciculations. Motor: 5/5 throughout Sensation: endorses reduced pinprick and light touch to both thumbs.  Vibration intact. Deep Tendon Reflexes: 2+ throughout, toes down Finger to nose testing: no dysmetria Heel to shin: no dysmetria Gait: steady, no ataxia. Romberg negative.  IMPRESSION: 1.  Abnormal MRI of brain.  Reveals evidence of single punctate microhemorrhage.  It is old.  Possibly related to hypertension at some point.  It is an incidental finding. 2.  Right forehead weakness.  Possible right partial Bell's palsy.  I don't think the surgery is a cause and there is no structural lesion in the brainstem or posterior fossa.  Only time will tell if there is any improvement. 3.  Right-sided headache.  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 Due to her recent surgery, I  am unable to perform an occipital nerve block.  PLAN: 1.  May use ibuprofen as needed for headache but limit to no more than 2 days out of week to prevent medication-overuse headache. 2.  If headache persists and remains frequent, may consider increasing amitriptyline to 50mg  daily.   3.  We will make a tentative follow up in 3 months.  If doing well, she may cancel the appointment.  45 minutes spent with patient, over 50% spent counseling and coordinating care.  Thank you for allowing me to take part in the care of this patient.  Shon Millet, DO  CC:  Marga Melnick, MD

## 2013-11-17 NOTE — Patient Instructions (Signed)
1.  The only explanation I have for the right forehead weakness is a partial Bell's palsy.  I don't think the surgery would have done this and there is no structural abnormality in the back of the brain that would be pinching that nerve.  At this point, there is nothing further to do to help improve it and only time will tell. 2.  The headaches are likely due to the neck.  You also may have occipital neuralgia on the right side as well, which can contribute to it.  You may use ibuprofen as needed, but don't take pain medications more than 2 days out of the week in order to prevent medication overuse headaches.  If the headache persists and is still frequent, then you and Dr. Alwyn Ren may consider increasing the amitriptyline to 50mg  daily, since this medication is also used for chronic headache. 3.  The microhemorrhage on the MRI is old and nothing to be worried about. It may have been due to uncontrolled high blood pressure at some point. It is okay to take ibuprofen. 4.  Let's make a tentative follow up in 3 months.  If things are going well by then, you may cancel the appointment.

## 2013-11-20 ENCOUNTER — Other Ambulatory Visit (INDEPENDENT_AMBULATORY_CARE_PROVIDER_SITE_OTHER): Payer: BC Managed Care – PPO

## 2013-11-20 ENCOUNTER — Encounter: Payer: Self-pay | Admitting: Internal Medicine

## 2013-11-20 ENCOUNTER — Other Ambulatory Visit: Payer: Self-pay | Admitting: General Practice

## 2013-11-20 DIAGNOSIS — I10 Essential (primary) hypertension: Secondary | ICD-10-CM

## 2013-11-20 DIAGNOSIS — D649 Anemia, unspecified: Secondary | ICD-10-CM

## 2013-11-20 LAB — POC HEMOCCULT BLD/STL (HOME/3-CARD/SCREEN)
Card #2 Fecal Occult Blod, POC: NEGATIVE
Card #3 Fecal Occult Blood, POC: NEGATIVE
Fecal Occult Blood, POC: POSITIVE

## 2013-11-20 MED ORDER — LOSARTAN POTASSIUM 50 MG PO TABS: 50.0000 mg | ORAL_TABLET | Freq: Every day | ORAL | Status: DC

## 2013-11-21 ENCOUNTER — Encounter: Payer: Self-pay | Admitting: Internal Medicine

## 2013-11-24 ENCOUNTER — Telehealth: Payer: Self-pay | Admitting: *Deleted

## 2013-11-24 NOTE — Telephone Encounter (Signed)
Pt called and asked if ok to have taupe colored nail polish on for procedure.  I informed her it is ok to keep that color on

## 2013-11-26 ENCOUNTER — Ambulatory Visit (AMBULATORY_SURGERY_CENTER): Payer: BC Managed Care – PPO | Admitting: Internal Medicine

## 2013-11-26 ENCOUNTER — Encounter: Payer: Self-pay | Admitting: Internal Medicine

## 2013-11-26 VITALS — BP 153/97 | HR 88 | Temp 97.5°F | Resp 18 | Ht 65.0 in | Wt 190.0 lb

## 2013-11-26 DIAGNOSIS — Z1211 Encounter for screening for malignant neoplasm of colon: Secondary | ICD-10-CM

## 2013-11-26 MED ORDER — SODIUM CHLORIDE 0.9 % IV SOLN: 500.0000 mL | INTRAVENOUS | Status: DC

## 2013-11-26 NOTE — Op Note (Signed)
[facility] [address] [place], [address]   COLONOSCOPY PROCEDURE REPORT  PATIENT: Morgan Andrews, Morgan Andrews  MR#: [id number] BIRTHDATE: 12/14/1960 , 52  yrs. old GENDER: Female ENDOSCOPIST: Roxy Cedar, MD REFERRED [id number] Alwyn Ren, M.D. PROCEDURE DATE:  11/26/2013 PROCEDURE:   Colonoscopy, screening First Screening Colonoscopy - Avg.  risk and is 50 yrs.  old or older Yes.  Prior Negative Screening - Now for repeat screening. N/A  History of Adenoma - Now for follow-up colonoscopy & has been > or = to 3 yrs.  N/A  Polyps Removed Today? No.  Recommend repeat exam, <10 yrs? No. ASA CLASS:   Class II INDICATIONS:average risk screening. MEDICATIONS: MAC sedation, administered by CRNA and propofol (Diprivan) 300mg  IV  DESCRIPTION OF PROCEDURE:   After the risks benefits and alternatives of the procedure were thoroughly explained, informed consent was obtained.  A digital rectal exam revealed no abnormalities of the rectum.   The LB [id number]  endoscope was introduced through the anus and advanced to the cecum, which was identified by both the appendix and ileocecal valve. No adverse events experienced.   The quality of the prep was excellent, using MoviPrep  The instrument was then slowly withdrawn as the colon was fully examined.  COLON FINDINGS: A normal appearing cecum, ileocecal valve, and appendiceal orifice were identified.  The ascending, hepatic flexure, transverse, splenic flexure, descending, sigmoid colon and rectum appeared unremarkable.  No polyps or cancers were seen. Retroflexed views revealed internal hemorrhoids. The time to cecum=3 minutes 44 seconds.  Withdrawal time=9 minutes 35 seconds. The scope was withdrawn and the procedure completed.  COMPLICATIONS: There were no complications.  ENDOSCOPIC IMPRESSION: 1. Normal colon  RECOMMENDATIONS: 1. Continue current colorectal screening recommendations for "routine risk" patients with  a repeat colonoscopy in 10 years. 2. Schedule EGD [facility] "iron deficiency anemia and heme + stool"   eSigned:  Roxy Cedar, MD 11/26/2013 8:44 AM   cc: Pecola Lawless, MD and The Patient

## 2013-11-26 NOTE — Progress Notes (Signed)
Patient did not experience any of the following events: a burn prior to discharge; a fall within the facility; wrong site/side/patient/procedure/implant event; or a hospital transfer or hospital admission upon discharge from the facility. (G8907) Patient did not have preoperative order for IV antibiotic SSI prophylaxis. (G8918)  

## 2013-11-26 NOTE — Patient Instructions (Signed)
Discharge instructions given with verbal understanding. Normal exam. Schedule a EGD for 12-07-2013 at 10:30AM. RESUME PREVIOUS MEDICATIONS. YOU HAD AN ENDOSCOPIC PROCEDURE TODAY AT THE [facility]: Refer to the procedure report that was given to you for any specific questions about what was found during the examination.  If the procedure report does not answer your questions, please call your gastroenterologist to clarify.  If you requested that your care partner not be given the details of your procedure findings, then the procedure report has been included in a sealed envelope for you to review at your convenience later.  YOU SHOULD EXPECT: Some feelings of bloating in the abdomen. Passage of more gas than usual.  Walking can help get rid of the air that was put into your GI tract during the procedure and reduce the bloating. If you had a lower endoscopy (such as a colonoscopy or flexible sigmoidoscopy) you may notice spotting of blood in your stool or on the toilet paper. If you underwent a bowel prep for your procedure, then you may not have a normal bowel movement for a few days.  DIET: Your first meal following the procedure should be a light meal and then it is ok to progress to your normal diet.  A half-sandwich or bowl of soup is an example of a good first meal.  Heavy or fried foods are harder to digest and may make you feel nauseous or bloated.  Likewise meals heavy in dairy and vegetables can cause extra gas to form and this can also increase the bloating.  Drink plenty of fluids but you should avoid alcoholic beverages for 24 hours.  ACTIVITY: Your care partner should take you home directly after the procedure.  You should plan to take it easy, moving slowly for the rest of the day.  You can resume normal activity the day after the procedure however you should NOT DRIVE or use heavy machinery for 24 hours (because of the sedation medicines used during the test).    SYMPTOMS TO  REPORT IMMEDIATELY: A gastroenterologist can be reached at any hour.  During normal business hours, 8:30 AM to 5:00 PM Monday through Friday, call [phone].  After hours and on weekends, please call the GI answering service at [phone] who will take a message and have the physician on call contact you.   Following lower endoscopy (colonoscopy or flexible sigmoidoscopy):  Excessive amounts of blood in the stool  Significant tenderness or worsening of abdominal pains  Swelling of the abdomen that is new, acute  Fever of 100F or higher  FOLLOW UP: If any biopsies were taken you will be contacted by phone or by letter within the next 1-3 weeks.  Call your gastroenterologist if you have not heard about the biopsies in 3 weeks.  Our staff will call the home number listed on your records the next business day following your procedure to check on you and address any questions or concerns that you may have at that time regarding the information given to you following your procedure. This is a courtesy call and so if there is no answer at the home number and we have not heard from you through the emergency physician on call, we will assume that you have returned to your regular daily activities without incident.  SIGNATURES/CONFIDENTIALITY: You and/or your care partner have signed paperwork which will be entered into your electronic medical record.  These signatures attest to the fact that that the information above on  your After Visit Summary has been reviewed and is understood.  Full responsibility of the confidentiality of this discharge information lies with you and/or your care-partner.

## 2013-11-27 ENCOUNTER — Other Ambulatory Visit: Payer: Self-pay | Admitting: Internal Medicine

## 2013-11-27 ENCOUNTER — Telehealth: Payer: Self-pay

## 2013-11-27 NOTE — Telephone Encounter (Signed)
Left message on answering machine. 

## 2013-11-27 NOTE — Telephone Encounter (Signed)
Trazodone refilled per protocol. JG//CMA 

## 2013-12-07 ENCOUNTER — Encounter: Payer: Self-pay | Admitting: Internal Medicine

## 2013-12-07 ENCOUNTER — Ambulatory Visit (AMBULATORY_SURGERY_CENTER): Payer: BC Managed Care – PPO | Admitting: Internal Medicine

## 2013-12-07 ENCOUNTER — Ambulatory Visit: Payer: BC Managed Care – PPO | Admitting: Neurology

## 2013-12-07 VITALS — BP 130/90 | HR 86 | Temp 98.6°F | Resp 16 | Ht 65.0 in | Wt 190.0 lb

## 2013-12-07 DIAGNOSIS — K222 Esophageal obstruction: Secondary | ICD-10-CM

## 2013-12-07 DIAGNOSIS — D509 Iron deficiency anemia, unspecified: Secondary | ICD-10-CM

## 2013-12-07 DIAGNOSIS — K2100 Gastro-esophageal reflux disease with esophagitis, without bleeding: Secondary | ICD-10-CM

## 2013-12-07 MED ORDER — SODIUM CHLORIDE 0.9 % IV SOLN: 500.0000 mL | INTRAVENOUS | Status: DC

## 2013-12-07 MED ORDER — OMEPRAZOLE 20 MG PO CPDR: 20.0000 mg | DELAYED_RELEASE_CAPSULE | Freq: Every day | ORAL | Status: DC

## 2013-12-07 NOTE — Op Note (Signed)
[facility] [address] [place], [address]   ENDOSCOPY PROCEDURE REPORT  PATIENT: Morgan, Andrews  MR#: [id number] BIRTHDATE: 12-Feb-1961 , 52  yrs. old GENDER: Female ENDOSCOPIST: Roxy Cedar, MD REFERRED BY:  Marga Melnick, M.D. PROCEDURE DATE:  12/07/2013 PROCEDURE:  EGD, diagnostic ASA CLASS:     Class II INDICATIONS:  Iron deficiency anemia.   Occult blood positive. Dysphagia. MEDICATIONS: MAC sedation, administered by CRNA and propofol (Diprivan) 150mg  IV TOPICAL ANESTHETIC: none  DESCRIPTION OF PROCEDURE: After the risks benefits and alternatives of the procedure were thoroughly explained, informed consent was obtained.  The LB [id number] endoscope was introduced through the mouth and advanced to the third portion of the duodenum. Without limitations.  The instrument was slowly withdrawn as the mucosa was fully examined.      EXAM: Distal esophagus with esophagitis (edema and friability) as well as 15mm ring-like benign stricture.  No Barrett's.  Otherwise normal EGD to D3.  Retroflexed views revealed a hiatal hernia. The scope was then withdrawn from the patient and the procedure completed.  COMPLICATIONS: There were no complications. ENDOSCOPIC IMPRESSION: 1. Distal esophagus with esophagitis and benign stricture. 2. Otherwise normal EGD to D3.  RECOMMENDATIONS: 1.  Anti-reflux regimen to be followed 2.  Prescribe omeprazole 20 mg daily; #30; 11 refills 3.  Call for office visit to be seen by Dr Marina Goodell in 8 weeks for follow up  REPEAT EXAM:  eSigned:  Roxy Cedar, MD 12/07/2013 11:03 AM   [id number] Minerva Ends, MD and The Patient

## 2013-12-07 NOTE — Patient Instructions (Signed)
YOU HAD AN ENDOSCOPIC PROCEDURE TODAY AT THE [facility]: Refer to the procedure report that was given to you for any specific questions about what was found during the examination.  If the procedure report does not answer your questions, please call your gastroenterologist to clarify.  If you requested that your care partner not be given the details of your procedure findings, then the procedure report has been included in a sealed envelope for you to review at your convenience later.  YOU SHOULD EXPECT: Some feelings of bloating in the abdomen. Passage of more gas than usual.  Walking can help get rid of the air that was put into your GI tract during the procedure and reduce the bloating. If you had a lower endoscopy (such as a colonoscopy or flexible sigmoidoscopy) you may notice spotting of blood in your stool or on the toilet paper. If you underwent a bowel prep for your procedure, then you may not have a normal bowel movement for a few days.  DIET: Your first meal following the procedure should be a light meal and then it is ok to progress to your normal diet.  A half-sandwich or bowl of soup is an example of a good first meal.  Heavy or fried foods are harder to digest and may make you feel nauseous or bloated.  Likewise meals heavy in dairy and vegetables can cause extra gas to form and this can also increase the bloating.  Drink plenty of fluids but you should avoid alcoholic beverages for 24 hours.  ACTIVITY: Your care partner should take you home directly after the procedure.  You should plan to take it easy, moving slowly for the rest of the day.  You can resume normal activity the day after the procedure however you should NOT DRIVE or use heavy machinery for 24 hours (because of the sedation medicines used during the test).    SYMPTOMS TO REPORT IMMEDIATELY: A gastroenterologist can be reached at any hour.  During normal business hours, 8:30 AM to 5:00 PM Monday through Friday,  call [phone].  After hours and on weekends, please call the GI answering service at [phone] who will take a message and have the physician on call contact you.  Following upper endoscopy (EGD)  Vomiting of blood or coffee ground material  New chest pain or pain under the shoulder blades  Painful or persistently difficult swallowing  New shortness of breath  Fever of 100F or higher  Black, tarry-looking stools  FOLLOW UP: If any biopsies were taken you will be contacted by phone or by letter within the next 1-3 weeks.  Call your gastroenterologist if you have not heard about the biopsies in 3 weeks.  Our staff will call the home number listed on your records the next business day following your procedure to check on you and address any questions or concerns that you may have at that time regarding the information given to you following your procedure. This is a courtesy call and so if there is no answer at the home number and we have not heard from you through the emergency physician on call, we will assume that you have returned to your regular daily activities without incident.  SIGNATURES/CONFIDENTIALITY: You and/or your care partner have signed paperwork which will be entered into your electronic medical record.  These signatures attest to the fact that that the information above on your After Visit Summary has been reviewed and is understood.  Full responsibility of   the confidentiality of this discharge information lies with you and/or your care-partner. 

## 2013-12-07 NOTE — Progress Notes (Signed)
Dr. Marina Goodell made aware of elevated blood pressure and pt. Not taking blood pressure medication this morning, also made aware of pt. Wearing neck brace and recent spinal surgery in October.

## 2013-12-07 NOTE — Progress Notes (Signed)
Procedure ends, to recovery, report given and VSS. 

## 2013-12-08 ENCOUNTER — Telehealth: Payer: Self-pay

## 2013-12-08 NOTE — Telephone Encounter (Signed)
Left message on answering machine. 

## 2013-12-14 ENCOUNTER — Encounter: Payer: Self-pay | Admitting: Internal Medicine

## 2013-12-24 ENCOUNTER — Telehealth: Payer: Self-pay

## 2013-12-24 DIAGNOSIS — K2100 Gastro-esophageal reflux disease with esophagitis, without bleeding: Secondary | ICD-10-CM

## 2013-12-24 DIAGNOSIS — K222 Esophageal obstruction: Secondary | ICD-10-CM

## 2013-12-24 MED ORDER — OMEPRAZOLE 20 MG PO CPDR: 20.0000 mg | DELAYED_RELEASE_CAPSULE | Freq: Every day | ORAL | Status: DC

## 2013-12-24 NOTE — Telephone Encounter (Signed)
Refilled Omeprazole 

## 2013-12-29 ENCOUNTER — Encounter: Payer: Self-pay | Admitting: Internal Medicine

## 2013-12-29 ENCOUNTER — Telehealth: Payer: Self-pay

## 2013-12-29 DIAGNOSIS — K2100 Gastro-esophageal reflux disease with esophagitis, without bleeding: Secondary | ICD-10-CM

## 2013-12-29 DIAGNOSIS — K222 Esophageal obstruction: Secondary | ICD-10-CM

## 2013-12-29 MED ORDER — OMEPRAZOLE 20 MG PO CPDR: 20.0000 mg | DELAYED_RELEASE_CAPSULE | Freq: Every day | ORAL | Status: DC

## 2013-12-29 NOTE — Telephone Encounter (Signed)
90 day supply of Omeprazole sent to [facility]

## 2014-01-04 ENCOUNTER — Telehealth: Payer: Self-pay

## 2014-01-04 DIAGNOSIS — K2100 Gastro-esophageal reflux disease with esophagitis, without bleeding: Secondary | ICD-10-CM

## 2014-01-04 DIAGNOSIS — K222 Esophageal obstruction: Secondary | ICD-10-CM

## 2014-01-04 MED ORDER — OMEPRAZOLE 20 MG PO CPDR: 20.0000 mg | DELAYED_RELEASE_CAPSULE | Freq: Every day | ORAL | Status: DC

## 2014-01-04 NOTE — Telephone Encounter (Signed)
Resent rx for Omeprazole to [facility]

## 2014-02-02 ENCOUNTER — Ambulatory Visit: Payer: BC Managed Care – PPO | Admitting: Internal Medicine

## 2014-02-02 ENCOUNTER — Encounter: Payer: Self-pay | Admitting: Internal Medicine

## 2014-02-16 ENCOUNTER — Ambulatory Visit: Payer: BC Managed Care – PPO | Admitting: Neurology

## 2014-02-17 DIAGNOSIS — F32A Depression, unspecified: Secondary | ICD-10-CM | POA: Insufficient documentation

## 2014-03-18 ENCOUNTER — Ambulatory Visit (INDEPENDENT_AMBULATORY_CARE_PROVIDER_SITE_OTHER): Payer: BC Managed Care – PPO | Admitting: Internal Medicine

## 2014-03-18 ENCOUNTER — Encounter: Payer: Self-pay | Admitting: Internal Medicine

## 2014-03-18 VITALS — BP 122/90 | HR 88 | Ht 64.0 in | Wt 201.2 lb

## 2014-03-18 DIAGNOSIS — Z1211 Encounter for screening for malignant neoplasm of colon: Secondary | ICD-10-CM

## 2014-03-18 DIAGNOSIS — R1310 Dysphagia, unspecified: Secondary | ICD-10-CM

## 2014-03-18 DIAGNOSIS — D509 Iron deficiency anemia, unspecified: Secondary | ICD-10-CM

## 2014-03-18 DIAGNOSIS — K219 Gastro-esophageal reflux disease without esophagitis: Secondary | ICD-10-CM

## 2014-03-18 MED ORDER — QC DAILY MULTIVITAMINS/IRON PO TABS: 1.0000 | ORAL_TABLET | Freq: Every day | ORAL | Status: DC

## 2014-03-18 NOTE — Progress Notes (Signed)
HISTORY OF PRESENT ILLNESS:  Morgan Andrews is a 53 y.o. female with the below listed medical history who presents today for post procedure followup. She was initially seen 11/26/2013 for "routine screening colonoscopy" as a direct referral. The examination was normal. At that time, it was noted that the patient also had been identified with mild iron deficiency anemia and Hemoccult-positive stool. Given the unremarkable colonoscopy, I recommend upper endoscopy. She does report some reflux symptoms at that time. Upper endoscopy performed 12/07/2013 revealed distal esophagitis with benign esophageal stricture. She was placed on omeprazole 20 mg daily. Followup arranged at this time. The patient is pleased to report complete resolution of reflux symptoms on omeprazole. However, she continues with intermittent solid food dysphagia, though slightly less. She is not on iron replacement. No other complaints.  REVIEW OF SYSTEMS:  All non-GI ROS negative except for  Past Medical History  Diagnosis Date  . Asthma   . Eczema   . Seasonal allergies   . Hiatal hernia   . Anxiety     classic Panic attacks with sweating & chest pain  . Cervical radiculopathy     C5-6  . Anemia   . Hypertension   . Esophageal stricture   . GERD (gastroesophageal reflux disease)   . Basal cell carcinoma     Past Surgical History  Procedure Laterality Date  . Ankle surgery Right 1999-2002    X 5, Dr Percell Miller  . Tonsillectomy and adenoidectomy    . Cervical fusion  2008, 10/05/13    x 3 fusion, discectomy & plating , Dr Arnoldo Morale  . Wrist surgery Left   . Upper gastrointestinal endoscopy  2007    negative  . No colonoscopy    . Partial hysterectomy      Social History Morgan Andrews  reports that she has never smoked. She has never used smokeless tobacco. She reports that she drinks alcohol. She reports that she does not use illicit drugs.  family history includes Anxiety disorder in her mother and sister; Asthma  in her mother; Depression in her mother; Hypertension in her maternal aunt and mother; Rheum arthritis in her maternal aunt; Stroke in her maternal aunt. There is no history of Diabetes or Colon cancer. She was adopted.  Allergies  Allergen Reactions  . Clemastine Fumarate     REACTION: ASTHMA ATTACK( Note: Tavist D caused asthma flare from" excessive drying")  . Lyrica [Pregabalin]     Pedal edema       PHYSICAL EXAMINATION: Vital signs: BP 122/90  Pulse 88  Ht 5\' 4"  (1.626 m)  Wt 201 lb 4 oz (91.286 kg)  BMI 34.53 kg/m2 General: Well-developed, well-nourished, no acute distress HEENT: Sclerae are anicteric, conjunctiva pink. Oral mucosa intact Lungs: Clear Heart: Regular Abdomen: soft, nontender, nondistended, no obvious ascites, no peritoneal signs, normal bowel sounds. No organomegaly. Extremities: No edema Psychiatric: alert and oriented x3. Cooperative   ASSESSMENT:  #1. Mild iron deficiency anemia and Hemoccult-positive stool. Likely secondary to erosive esophagitis #2. GERD complicated by peptic stricture. GERD symptoms resolved on PPI. Still with intermittent dysphagia due to stricture #3. Normal recent colonoscopy   PLAN:   #1. Continue PPI #2. Multivitamin with iron once daily #3. Upper endoscopy with esophageal dilation.The nature of the procedure, as well as the risks, benefits, and alternatives were carefully and thoroughly reviewed with the patient. Ample time for discussion and questions allowed. The patient understood, was satisfied, and agreed to proceed. #4. Repeat screening colonoscopy December 2024

## 2014-03-18 NOTE — Patient Instructions (Signed)
You have been scheduled for an endoscopy with propofol. Please follow written instructions given to you at your visit today. If you use inhalers (even only as needed), please bring them with you on the day of your procedure.   As discussed with Dr. Henrene Pastor, please begin taking a multivitamin with iron once a day

## 2014-04-07 ENCOUNTER — Encounter: Payer: Self-pay | Admitting: Internal Medicine

## 2014-04-12 ENCOUNTER — Other Ambulatory Visit: Payer: Self-pay | Admitting: Orthopaedic Surgery

## 2014-04-12 DIAGNOSIS — M25512 Pain in left shoulder: Secondary | ICD-10-CM

## 2014-04-13 ENCOUNTER — Ambulatory Visit
Admission: RE | Admit: 2014-04-13 | Discharge: 2014-04-13 | Disposition: A | Discharge status: Home / Self Care | Payer: BC Managed Care – PPO | Source: Ambulatory Visit | Attending: Orthopaedic Surgery | Admitting: Orthopaedic Surgery

## 2014-04-13 DIAGNOSIS — M25512 Pain in left shoulder: Secondary | ICD-10-CM

## 2014-04-30 ENCOUNTER — Other Ambulatory Visit: Payer: Self-pay | Admitting: Internal Medicine

## 2014-05-11 ENCOUNTER — Encounter: Payer: BC Managed Care – PPO | Admitting: Internal Medicine

## 2014-05-21 ENCOUNTER — Other Ambulatory Visit: Payer: Self-pay | Admitting: Orthopaedic Surgery

## 2014-05-21 DIAGNOSIS — M542 Cervicalgia: Secondary | ICD-10-CM

## 2014-05-27 ENCOUNTER — Ambulatory Visit
Admission: RE | Admit: 2014-05-27 | Discharge: 2014-05-27 | Disposition: A | Discharge status: Home / Self Care | Payer: BC Managed Care – PPO | Source: Ambulatory Visit | Attending: Orthopaedic Surgery | Admitting: Orthopaedic Surgery

## 2014-05-27 DIAGNOSIS — M542 Cervicalgia: Secondary | ICD-10-CM

## 2014-06-10 ENCOUNTER — Other Ambulatory Visit: Payer: Self-pay | Admitting: Internal Medicine

## 2014-06-11 ENCOUNTER — Other Ambulatory Visit: Payer: Self-pay | Admitting: Internal Medicine

## 2014-06-14 NOTE — Telephone Encounter (Signed)
OK X1 

## 2014-06-28 ENCOUNTER — Encounter (HOSPITAL_BASED_OUTPATIENT_CLINIC_OR_DEPARTMENT_OTHER): Payer: Self-pay | Admitting: *Deleted

## 2014-06-28 ENCOUNTER — Other Ambulatory Visit: Payer: Self-pay | Admitting: Physician Assistant

## 2014-06-28 ENCOUNTER — Encounter: Payer: Self-pay | Admitting: Internal Medicine

## 2014-06-28 DIAGNOSIS — Z9189 Other specified personal risk factors, not elsewhere classified: Secondary | ICD-10-CM | POA: Insufficient documentation

## 2014-06-28 NOTE — Progress Notes (Signed)
06/28/14 1154  OBSTRUCTIVE SLEEP APNEA  Have you ever been diagnosed with sleep apnea through a sleep study? No  Do you snore loudly (loud enough to be heard through closed doors)?  1  Do you often feel tired, fatigued, or sleepy during the daytime? 0  Has anyone observed you stop breathing during your sleep? 0  Do you have, or are you being treated for high blood pressure? 1  BMI more than 35 kg/m2? 0  Age over 53 years old? 1  Neck circumference greater than 40 cm/16 inches? 1  Gender: 0  Obstructive Sleep Apnea Score 4

## 2014-06-28 NOTE — Progress Notes (Signed)
To come in for bmet-ekg done 9/14 Will bring all meds and overnight bag in case she has to stay

## 2014-06-30 ENCOUNTER — Encounter (HOSPITAL_BASED_OUTPATIENT_CLINIC_OR_DEPARTMENT_OTHER)
Admission: RE | Admit: 2014-06-30 | Discharge: 2014-06-30 | Disposition: A | Discharge status: Home / Self Care | Payer: BC Managed Care – PPO | Source: Ambulatory Visit | Attending: Orthopedic Surgery | Admitting: Orthopedic Surgery

## 2014-06-30 LAB — BASIC METABOLIC PANEL
Anion gap: 11 (ref 5–15)
BUN: 11 mg/dL (ref 6–23)
CO2: 31 mEq/L (ref 19–32)
Calcium: 9.4 mg/dL (ref 8.4–10.5)
Chloride: 101 mEq/L (ref 96–112)
Creatinine, Ser: 0.82 mg/dL (ref 0.50–1.10)
GFR calc Af Amer: >90 mL/min (ref >90)
GFR calc non Af Amer: 81 mL/min — ABNORMAL LOW (ref >90)
Glucose, Bld: 90 mg/dL (ref 70–99)
Potassium: 4.8 mEq/L (ref 3.7–5.3)
Sodium: 143 mEq/L (ref 137–147)

## 2014-06-30 NOTE — H&P (Signed)
[facility] [address] [place], [place] [address] [phone] A Division of [facility]  Ninetta Lights, M.D.   Robert A. Noemi Chapel, M.D.   Faythe Casa, M.D.   Johnny Bridge, M.D.   Almedia Balls, M.D Ernesta Amble. Percell Miller, M.D.  Joseph Pierini, M.D.  Lanier Prude, M.D.    Verner Chol, M.D. Mary L. Fenton Malling, PA-C  Kirstin A. Shepperson, PA-C  Josh [place], PA-C [place], [place]   RE: Morgan, Andrews                                [id number]      DOB: [birth year] PROGRESS NOTE: 06-25-14 Morgan Andrews comes in for evaluation of her left shoulder.  I have received referral, as well as treatment notes in [facility] and reviewed those.  She has had symptoms with her shoulder for almost a year.  Superimposed and somewhat muddying the waters has been an issue with her cervical spine eventually culminating in cervical fusion in October of 2014.  This has been managed and followed by Dr. Patrice Paradise.  This is felt to have been successful surgery with improvement of her neck and radicular symptoms.  It was hoped that her shoulder would resolve after treatment of her neck.  Unfortunately that has not been the case.  She has persistent pain in and around her left shoulder.  It goes from her neck, over the top of her shoulder and all the way down her arm.  Occasional numbness, tingling and burning.  She has had two injections.  One of these was done anteriorly and another posteriorly.  I am not sure of the placement of medication, but she states neither one of these really significantly helped her at all.  Those were done to try and help sort out whether the pain was from her shoulder or her neck.  Eventually underwent an MRI scan of her shoulder that was completed on Apr 13, 2014.  I have looked at that report, as well as the scan itself.  This was read as a picture of subacromial bursitis.  In looking at this time, to me it looks like there may be  an area of calcification within the rotator cuff.  There certainly isn't a tear, but there is overlying bursitis.   History, workup and treatment to date is reviewed.  EXAMINATION: General exam is outlined and included in the chart.  Specifically, healthy 53 year-old female.  She does have reasonable cervical motion.  Although she has pain going down her left arm, she is neurovascularly intact distally.  The left shoulder itself however has markedly positive impingement and cuff irritation.  Passive motion is 20% short of normal, but it is better than her active motion, which is only 50% of normal.  All of this limited because of pain when she tries to fire her rotator cuff.  A little biceps irritation.  Sore over her AC joint.  No instability.  Opposite right shoulder has good motion and good stability.   X-RAYS: I did obtain a three view x-ray of her shoulder.  Type III acromion.  Reasonable subacromial space.  It looks like she has some calcification at the supraspinatus attachment of her cuff into the tuberosity.   DISPOSITION:  Although this has been confusing and I understand why, I really do feel that her shoulder is a separate and different  issue from her neck.  I have explained this to her.  I want to try one final diagnostic/therapeutic subacromial injection to see if I at least get some degree of transient relief with Marcaine in place.  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  She has been symptomatic for a year.  I think it is time we get to the bottom of what is going on.  We have discussed exam under anesthesia, arthroscopy and decompression.  Fluoroscopic guidance to look at her cuff.  Debridement of any calcification in the cuff with repair of the cuff as needed once that is done.  We have to be a little cautious in the outlook, as I am still a little worried she could be getting pain from her neck.  I am confident  enough that a lot of it is coming from her shoulder and we should proceed.  She understands and agrees.  Obviously if we get dramatic improvement from this injection I am happy to hold off, but I doubt that is going to be the case.  Procedure, risks, benefits and complications reviewed. Paperwork complete.  All questions answered.  More than 25 minutes spent face-to-face covering all of this with her.  Continued  [facility] [address] [place], [place] [address] [phone] A Division of [facility]  Ninetta Lights, M.D.   Robert A. Noemi Chapel, M.D.   Faythe Casa, M.D.   Johnny Bridge, M.D.   Almedia Balls, M.D Ernesta Amble. Percell Miller, M.D.  Joseph Pierini, M.D.  Lanier Prude, M.D.    Verner Chol, M.D. Mary L. Fenton Malling, PA-C  Kirstin A. Shepperson, PA-C  Josh Madison, PA-C [place], [place]   RE: Morgan, Andrews                                [id number]      DOB: Apr 20, 1961 PROGRESS NOTE: 06-25-14 PROCEDURE NOTE: The patient's clinical condition is marked by substantial pain and/or significant functional disability.  Other conservative therapy has not provided relief, is contraindicated, or not appropriate.  There is a reasonable likelihood that injection will significantly improve the patient's pain and/or functional disability. After appropriate consent and under sterile technique subacromial injection of the left shoulder with Depo-Medrol/Marcaine.  Tolerated this well.  I think there is some improvement of motion and definitely pain with Marcaine in place.    Ninetta Lights, M.D.   Electronically verified by Ninetta Lights, M.D. [id number] Cc: Dr. Rennis Harding Fax: [phone] D 06-25-14 T 06-28-14

## 2014-07-01 ENCOUNTER — Encounter (HOSPITAL_BASED_OUTPATIENT_CLINIC_OR_DEPARTMENT_OTHER)
Admission: RE | Disposition: A | Discharge status: Home / Self Care | Payer: Self-pay | Source: Ambulatory Visit | Attending: Orthopedic Surgery

## 2014-07-01 ENCOUNTER — Encounter (HOSPITAL_BASED_OUTPATIENT_CLINIC_OR_DEPARTMENT_OTHER): Payer: Self-pay | Admitting: Anesthesiology

## 2014-07-01 ENCOUNTER — Ambulatory Visit (HOSPITAL_BASED_OUTPATIENT_CLINIC_OR_DEPARTMENT_OTHER): Payer: BC Managed Care – PPO | Admitting: Certified Registered"

## 2014-07-01 ENCOUNTER — Ambulatory Visit (HOSPITAL_BASED_OUTPATIENT_CLINIC_OR_DEPARTMENT_OTHER)
Admission: RE | Admit: 2014-07-01 | Discharge: 2014-07-01 | Disposition: A | Discharge status: Home / Self Care | Payer: BC Managed Care – PPO | Source: Ambulatory Visit | Attending: Orthopedic Surgery | Admitting: Orthopedic Surgery

## 2014-07-01 ENCOUNTER — Encounter (HOSPITAL_BASED_OUTPATIENT_CLINIC_OR_DEPARTMENT_OTHER): Payer: BC Managed Care – PPO | Admitting: Certified Registered"

## 2014-07-01 DIAGNOSIS — M7530 Calcific tendinitis of unspecified shoulder: Secondary | ICD-10-CM | POA: Insufficient documentation

## 2014-07-01 DIAGNOSIS — M25819 Other specified joint disorders, unspecified shoulder: Secondary | ICD-10-CM | POA: Insufficient documentation

## 2014-07-01 DIAGNOSIS — Z01812 Encounter for preprocedural laboratory examination: Secondary | ICD-10-CM | POA: Insufficient documentation

## 2014-07-01 DIAGNOSIS — M19019 Primary osteoarthritis, unspecified shoulder: Secondary | ICD-10-CM | POA: Insufficient documentation

## 2014-07-01 DIAGNOSIS — M719 Bursopathy, unspecified: Secondary | ICD-10-CM | POA: Insufficient documentation

## 2014-07-01 DIAGNOSIS — Z981 Arthrodesis status: Secondary | ICD-10-CM | POA: Insufficient documentation

## 2014-07-01 DIAGNOSIS — I10 Essential (primary) hypertension: Secondary | ICD-10-CM | POA: Insufficient documentation

## 2014-07-01 DIAGNOSIS — M7580 Other shoulder lesions, unspecified shoulder: Principal | ICD-10-CM

## 2014-07-01 DIAGNOSIS — M67919 Unspecified disorder of synovium and tendon, unspecified shoulder: Secondary | ICD-10-CM | POA: Insufficient documentation

## 2014-07-01 HISTORY — PX: SHOULDER ARTHROSCOPY WITH SUBACROMIAL DECOMPRESSION, ROTATOR CUFF REPAIR AND BICEP TENDON REPAIR: SHX5687

## 2014-07-01 LAB — POCT HEMOGLOBIN-HEMACUE: Hemoglobin: 14.1 g/dL (ref 12.0–15.0)

## 2014-07-01 SURGERY — SHOULDER ARTHROSCOPY WITH SUBACROMIAL DECOMPRESSION, ROTATOR CUFF REPAIR AND BICEP TENDON REPAIR
Anesthesia: General | Site: Shoulder | Laterality: Left

## 2014-07-01 MED ORDER — MIDAZOLAM HCL 2 MG/2ML IJ SOLN
1.0000 mg | INTRAMUSCULAR | Status: DC | PRN
  Administered 2014-07-01: 1 mg via INTRAVENOUS

## 2014-07-01 MED ORDER — CHLORHEXIDINE GLUCONATE 4 % EX LIQD: 60.0000 mL | Freq: Once | CUTANEOUS | Status: DC

## 2014-07-01 MED ORDER — MIDAZOLAM HCL 2 MG/2ML IJ SOLN
INTRAMUSCULAR | Status: AC
  Filled 2014-07-01: qty 2

## 2014-07-01 MED ORDER — HYDROMORPHONE HCL PF 1 MG/ML IJ SOLN
0.2500 mg | INTRAMUSCULAR | Status: DC | PRN
  Administered 2014-07-01 (×3): 0.5 mg via INTRAVENOUS

## 2014-07-01 MED ORDER — SUCCINYLCHOLINE CHLORIDE 20 MG/ML IJ SOLN
INTRAMUSCULAR | Status: DC | PRN
  Administered 2014-07-01: 100 mg via INTRAVENOUS

## 2014-07-01 MED ORDER — PHENYLEPHRINE HCL 10 MG/ML IJ SOLN
10.0000 mg | INTRAMUSCULAR | Status: DC | PRN
  Administered 2014-07-01: 50 ug/min via INTRAVENOUS

## 2014-07-01 MED ORDER — PROMETHAZINE HCL 25 MG/ML IJ SOLN: 6.2500 mg | INTRAMUSCULAR | Status: DC | PRN

## 2014-07-01 MED ORDER — METOCLOPRAMIDE HCL 5 MG PO TABS: 5.0000 mg | ORAL_TABLET | Freq: Three times a day (TID) | ORAL | Status: DC | PRN

## 2014-07-01 MED ORDER — ONDANSETRON HCL 4 MG/2ML IJ SOLN
INTRAMUSCULAR | Status: DC | PRN
  Administered 2014-07-01: 4 mg via INTRAVENOUS

## 2014-07-01 MED ORDER — OXYCODONE HCL 5 MG PO TABS
ORAL_TABLET | ORAL | Status: AC
  Filled 2014-07-01: qty 1

## 2014-07-01 MED ORDER — EPHEDRINE SULFATE 50 MG/ML IJ SOLN
INTRAMUSCULAR | Status: DC | PRN
  Administered 2014-07-01 (×2): 10 mg via INTRAVENOUS

## 2014-07-01 MED ORDER — FENTANYL CITRATE 0.05 MG/ML IJ SOLN
50.0000 ug | INTRAMUSCULAR | Status: DC | PRN
  Administered 2014-07-01: 50 ug via INTRAVENOUS

## 2014-07-01 MED ORDER — CEFAZOLIN SODIUM-DEXTROSE 2-3 GM-% IV SOLR
INTRAVENOUS | Status: AC
  Filled 2014-07-01: qty 50

## 2014-07-01 MED ORDER — HYDROMORPHONE HCL PF 1 MG/ML IJ SOLN: 0.5000 mg | INTRAMUSCULAR | Status: DC | PRN

## 2014-07-01 MED ORDER — OXYCODONE-ACETAMINOPHEN 5-325 MG PO TABS: 1.0000 | ORAL_TABLET | ORAL | Status: DC | PRN

## 2014-07-01 MED ORDER — HYDROMORPHONE HCL PF 1 MG/ML IJ SOLN
INTRAMUSCULAR | Status: AC
  Filled 2014-07-01: qty 1

## 2014-07-01 MED ORDER — CEFAZOLIN SODIUM-DEXTROSE 2-3 GM-% IV SOLR
2.0000 g | INTRAVENOUS | Status: AC
  Administered 2014-07-01: 2 g via INTRAVENOUS

## 2014-07-01 MED ORDER — ONDANSETRON HCL 4 MG PO TABS: 4.0000 mg | ORAL_TABLET | Freq: Four times a day (QID) | ORAL | Status: DC | PRN

## 2014-07-01 MED ORDER — FENTANYL CITRATE 0.05 MG/ML IJ SOLN
INTRAMUSCULAR | Status: AC
  Filled 2014-07-01: qty 2

## 2014-07-01 MED ORDER — OXYCODONE HCL 5 MG PO TABS
5.0000 mg | ORAL_TABLET | Freq: Once | ORAL | Status: AC | PRN
  Administered 2014-07-01: 5 mg via ORAL

## 2014-07-01 MED ORDER — DEXAMETHASONE SODIUM PHOSPHATE 4 MG/ML IJ SOLN
INTRAMUSCULAR | Status: DC | PRN
  Administered 2014-07-01: 10 mg via INTRAVENOUS

## 2014-07-01 MED ORDER — OXYCODONE HCL 5 MG/5ML PO SOLN: 5.0000 mg | Freq: Once | ORAL | Status: AC | PRN

## 2014-07-01 MED ORDER — METOCLOPRAMIDE HCL 5 MG/ML IJ SOLN: 5.0000 mg | Freq: Three times a day (TID) | INTRAMUSCULAR | Status: DC | PRN

## 2014-07-01 MED ORDER — BUPIVACAINE HCL (PF) 0.25 % IJ SOLN
INTRAMUSCULAR | Status: AC
  Filled 2014-07-01: qty 30

## 2014-07-01 MED ORDER — PROPOFOL 10 MG/ML IV BOLUS
INTRAVENOUS | Status: DC | PRN
  Administered 2014-07-01: 250 mg via INTRAVENOUS

## 2014-07-01 MED ORDER — ONDANSETRON HCL 4 MG PO TABS: 4.0000 mg | ORAL_TABLET | Freq: Three times a day (TID) | ORAL | Status: DC | PRN

## 2014-07-01 MED ORDER — LIDOCAINE HCL (CARDIAC) 20 MG/ML IV SOLN
INTRAVENOUS | Status: DC | PRN
  Administered 2014-07-01: 60 mg via INTRAVENOUS

## 2014-07-01 MED ORDER — LACTATED RINGERS IV SOLN
INTRAVENOUS | Status: DC
  Administered 2014-07-01 (×2): via INTRAVENOUS

## 2014-07-01 MED ORDER — ONDANSETRON HCL 4 MG/2ML IJ SOLN: 4.0000 mg | Freq: Four times a day (QID) | INTRAMUSCULAR | Status: DC | PRN

## 2014-07-01 MED ORDER — FENTANYL CITRATE 0.05 MG/ML IJ SOLN
INTRAMUSCULAR | Status: DC | PRN
  Administered 2014-07-01: 25 ug via INTRAVENOUS

## 2014-07-01 MED ORDER — LACTATED RINGERS IV SOLN: INTRAVENOUS | Status: DC

## 2014-07-01 SURGICAL SUPPLY — 73 items
ANCH SUT SWLK 19.1X5.5 CLS EL (Anchor) ×3 IMPLANT
ANCHOR PEEK SWIVEL LOCK 5.5 (Anchor) ×3 IMPLANT
APL SKNCLS STERI-STRIP NONHPOA (GAUZE/BANDAGES/DRESSINGS)
BENZOIN TINCTURE PRP APPL 2/3 (GAUZE/BANDAGES/DRESSINGS) IMPLANT
BLADE CUTTER GATOR 3.5 (BLADE) ×2 IMPLANT
BLADE CUTTER MENIS 5.5 (BLADE) IMPLANT
BLADE GREAT WHITE 4.2 (BLADE) ×2 IMPLANT
BLADE SURG 15 STRL LF DISP TIS (BLADE) ×1 IMPLANT
BLADE SURG 15 STRL SS (BLADE) ×2
BUR OVAL 6.0 (BURR) ×2 IMPLANT
CANISTER SUCT 3000ML (MISCELLANEOUS) IMPLANT
CANNULA DRY DOC 8X75 (CANNULA) IMPLANT
CANNULA TWIST IN 8.25X7CM (CANNULA) ×1 IMPLANT
DECANTER SPIKE VIAL GLASS SM (MISCELLANEOUS) IMPLANT
DRAPE STERI 35X30 U-POUCH (DRAPES) ×2 IMPLANT
DRAPE U-SHAPE 47X51 STRL (DRAPES) ×2 IMPLANT
DRAPE U-SHAPE 76X120 STRL (DRAPES) ×4 IMPLANT
DRSG PAD ABDOMINAL 8X10 ST (GAUZE/BANDAGES/DRESSINGS) ×2 IMPLANT
DURAPREP 26ML APPLICATOR (WOUND CARE) ×2 IMPLANT
ELECT MENISCUS 165MM 90D (ELECTRODE) ×2 IMPLANT
ELECT NDL TIP 2.8 STRL (NEEDLE) IMPLANT
ELECT NEEDLE TIP 2.8 STRL (NEEDLE) IMPLANT
ELECT REM PT RETURN 9FT ADLT (ELECTROSURGICAL) ×2
ELECTRODE REM PT RTRN 9FT ADLT (ELECTROSURGICAL) ×1 IMPLANT
GAUZE SPONGE 4X4 12PLY STRL (GAUZE/BANDAGES/DRESSINGS) ×4 IMPLANT
GAUZE XEROFORM 1X8 LF (GAUZE/BANDAGES/DRESSINGS) ×2 IMPLANT
GLOVE BIO SURGEON STRL SZ 6.5 (GLOVE) ×1 IMPLANT
GLOVE BIOGEL PI IND STRL 7.0 (GLOVE) ×1 IMPLANT
GLOVE BIOGEL PI INDICATOR 7.0 (GLOVE) ×2
GLOVE ECLIPSE 6.5 STRL STRAW (GLOVE) ×2 IMPLANT
GLOVE ORTHO TXT STRL SZ7.5 (GLOVE) ×2 IMPLANT
GOWN STRL REUS W/ TWL LRG LVL3 (GOWN DISPOSABLE) ×2 IMPLANT
GOWN STRL REUS W/ TWL XL LVL3 (GOWN DISPOSABLE) ×1 IMPLANT
GOWN STRL REUS W/TWL LRG LVL3 (GOWN DISPOSABLE) ×4
GOWN STRL REUS W/TWL XL LVL3 (GOWN DISPOSABLE) ×2
IV NS IRRIG 3000ML ARTHROMATIC (IV SOLUTION) ×10 IMPLANT
MANIFOLD NEPTUNE II (INSTRUMENTS) ×2 IMPLANT
NDL SCORPION MULTI FIRE (NEEDLE) IMPLANT
NDL SUT 6 .5 CRC .975X.05 MAYO (NEEDLE) IMPLANT
NEEDLE MAYO TAPER (NEEDLE)
NEEDLE SCORPION MULTI FIRE (NEEDLE) ×2 IMPLANT
NS IRRIG 1000ML POUR BTL (IV SOLUTION) IMPLANT
PACK ARTHROSCOPY DSU (CUSTOM PROCEDURE TRAY) ×2 IMPLANT
PACK BASIN DAY SURGERY FS (CUSTOM PROCEDURE TRAY) ×2 IMPLANT
PASSER SUT SWANSON 36MM LOOP (INSTRUMENTS) IMPLANT
PENCIL BUTTON HOLSTER BLD 10FT (ELECTRODE) ×2 IMPLANT
SET ARTHROSCOPY TUBING (MISCELLANEOUS) ×2
SET ARTHROSCOPY TUBING LN (MISCELLANEOUS) ×1 IMPLANT
SLEEVE SCD COMPRESS KNEE MED (MISCELLANEOUS) ×1 IMPLANT
SLING ARM IMMOBILIZER LRG (SOFTGOODS) ×1 IMPLANT
SLING ARM IMMOBILIZER MED (SOFTGOODS) IMPLANT
SLING ARM LRG ADULT FOAM STRAP (SOFTGOODS) IMPLANT
SLING ARM MED ADULT FOAM STRAP (SOFTGOODS) IMPLANT
SLING ARM XL FOAM STRAP (SOFTGOODS) IMPLANT
SPONGE LAP 4X18 X RAY DECT (DISPOSABLE) IMPLANT
STRIP CLOSURE SKIN 1/2X4 (GAUZE/BANDAGES/DRESSINGS) IMPLANT
SUCTION FRAZIER TIP 10 FR DISP (SUCTIONS) IMPLANT
SUT ETHIBOND 2 OS 4 DA (SUTURE) IMPLANT
SUT ETHILON 2 0 FS 18 (SUTURE) IMPLANT
SUT ETHILON 3 0 PS 1 (SUTURE) ×1 IMPLANT
SUT FIBERWIRE #2 38 T-5 BLUE (SUTURE)
SUT RETRIEVER MED (INSTRUMENTS) IMPLANT
SUT TIGER TAPE 7 IN WHITE (SUTURE) ×2 IMPLANT
SUT VIC AB 0 CT1 27 (SUTURE)
SUT VIC AB 0 CT1 27XBRD ANBCTR (SUTURE) IMPLANT
SUT VIC AB 2-0 SH 27 (SUTURE)
SUT VIC AB 2-0 SH 27XBRD (SUTURE) IMPLANT
SUT VIC AB 3-0 FS2 27 (SUTURE) IMPLANT
SUTURE FIBERWR #2 38 T-5 BLUE (SUTURE) IMPLANT
TAPE FIBER 2MM 7IN #2 BLUE (SUTURE) ×1 IMPLANT
TOWEL OR 17X24 6PK STRL BLUE (TOWEL DISPOSABLE) ×2 IMPLANT
WATER STERILE IRR 1000ML POUR (IV SOLUTION) ×2 IMPLANT
YANKAUER SUCT BULB TIP NO VENT (SUCTIONS) IMPLANT

## 2014-07-01 NOTE — Transfer of Care (Signed)
Immediate Anesthesia Transfer of Care Note  Patient: Morgan Andrews  Procedure(s) Performed: Procedure(s): LEFT SHOULDER ARTHROSCOPY  DEBRIDEMENT EXTENTSIVE,DISTAL CLAVICULECTOMY,DECOMPRESSION SUBACROMIAL PARTIAL ACROMIOPLASTY WITH CORACOACROMIAL RELEASE,ARTHROSCOPY SHOULDER WITH ROTATOR CUFF REPAIR. (Left)  Patient Location: PACU  Anesthesia Type:GA combined with regional for post-op pain  Level of Consciousness: awake, alert , oriented and patient cooperative  Airway & Oxygen Therapy: Patient Spontanous Breathing and Patient connected to face mask oxygen  Post-op Assessment: Report given to PACU RN and Post -op Vital signs reviewed and stable  Post vital signs: Reviewed and stable  Complications: No apparent anesthesia complications

## 2014-07-01 NOTE — Anesthesia Postprocedure Evaluation (Signed)
  Anesthesia Post-op Note  Patient: Morgan Andrews  Procedure(s) Performed: Procedure(s): LEFT SHOULDER ARTHROSCOPY  DEBRIDEMENT EXTENTSIVE,DISTAL CLAVICULECTOMY,DECOMPRESSION SUBACROMIAL PARTIAL ACROMIOPLASTY WITH CORACOACROMIAL RELEASE,ARTHROSCOPY SHOULDER WITH ROTATOR CUFF REPAIR. (Left)  Patient Location: PACU  Anesthesia Type:GA combined with regional for post-op pain  Level of Consciousness: awake and alert   Airway and Oxygen Therapy: Patient Spontanous Breathing  Post-op Pain: mild  Post-op Assessment: Post-op Vital signs reviewed and Patient's Cardiovascular Status Stable  Post-op Vital Signs: stable  Last Vitals:  Filed Vitals:   07/01/14 1500  BP: 118/74  Pulse: 82  Temp:   Resp: 15    Complications: No apparent anesthesia complications

## 2014-07-01 NOTE — Anesthesia Preprocedure Evaluation (Signed)
Anesthesia Evaluation    Airway        Dental   Pulmonary          Cardiovascular hypertension,     Neuro/Psych    GI/Hepatic   Endo/Other    Renal/GU      Musculoskeletal   Abdominal   Peds  Hematology   Anesthesia Other Findings   Reproductive/Obstetrics                             Anesthesia Physical Anesthesia Plan  ASA: III  Anesthesia Plan:    Post-op Pain Management:    Induction:   Airway Management Planned:   Additional Equipment:   Intra-op Plan:   Post-operative Plan:   Informed Consent:   Plan Discussed with:   Anesthesia Plan Comments:         Anesthesia Quick Evaluation  

## 2014-07-01 NOTE — Progress Notes (Signed)
Assisted Dr. Massagee with left, ultrasound guided, interscalene  block. Side rails up, monitors on throughout procedure. See vital signs in flow sheet. Tolerated Procedure well. 

## 2014-07-01 NOTE — Anesthesia Procedure Notes (Addendum)
Procedure Name: Intubation Performed by: Terrance Mass Pre-anesthesia Checklist: Patient identified, Timeout performed, Emergency Drugs available, Suction available and Patient being monitored Patient Re-evaluated:Patient Re-evaluated prior to inductionOxygen Delivery Method: Circle system utilized Preoxygenation: Pre-oxygenation with 100% oxygen Intubation Type: IV induction Ventilation: Mask ventilation without difficulty Laryngoscope Size: Miller and 2 Grade View: Grade I Tube type: Oral Number of attempts: 1 Airway Equipment and Method: Stylet and Video-laryngoscopy Secured at: 23 cm Tube secured with: Tape Dental Injury: Teeth and Oropharynx as per pre-operative assessment    Performed by: Terrance Mass Number of attempts: 3

## 2014-07-01 NOTE — Discharge Instructions (Signed)
Shouder arthroscopy, rotator cuff repair, subacromial decompression Care After Instructions Refer to this sheet in the next few weeks. These discharge instructions provide you with general information on caring for yourself after you leave the hospital. Your caregiver may also give you specific instructions. Your treatment has been planned according to the most current medical practices available, but unavoidable complications sometimes occur. If you have any problems or questions after discharge, please call your caregiver. HOME INSTRUCTIONS Wear sling at all times for the next 6 weeks.  Remove bandage and apply band-aids in 3 days.  May shower in 3 days, but do not soak incisions.  May apply ice for up to 20 minutes at a time for pain and swelling.  Follow up appointment in one week.  Eat a well-balanced diet.  Avoid lifting or driving until you are instructed otherwise.  Make an appointment to see your caregiver for stitches (suture) or staple removal as directed.   SEEK MEDICAL CARE IF: You have swelling of your calf or leg.  You develop shortness of breath or chest pain.  You have redness, swelling, or increasing pain in the wound.  There is pus or any unusual drainage coming from the surgical site.  You notice a bad smell coming from the surgical site or dressing.  The surgical site breaks open after sutures or staples have been removed.  There is persistent bleeding from the suture or staple line.  You are getting worse or are not improving.  You have any other questions or concerns.  SEEK IMMEDIATE MEDICAL CARE IF:  You have a fever greater than 101 You develop a rash.  You have difficulty breathing.  You develop any reaction or side effects to medicines given.  Your knee motion is decreasing rather than improving.  MAKE SURE YOU:  Understand these instructions.  Will watch your condition.  Will get help right away if you are not doing well or get worse.   Regional Anesthesia  Blocks  1. Numbness or the inability to move the "blocked" extremity may last from 3-48 hours after placement. The length of time depends on the medication injected and your individual response to the medication. If the numbness is not going away after 48 hours, call your surgeon.  2. The extremity that is blocked will need to be protected until the numbness is gone and the  Strength has returned. Because you cannot feel it, you will need to take extra care to avoid injury. Because it may be weak, you may have difficulty moving it or using it. You may not know what position it is in without looking at it while the block is in effect.  3. For blocks in the legs and feet, returning to weight bearing and walking needs to be done carefully. You will need to wait until the numbness is entirely gone and the strength has returned. You should be able to move your leg and foot normally before you try and bear weight or walk. You will need someone to be with you when you first try to ensure you do not fall and possibly risk injury.  4. Bruising and tenderness at the needle site are common side effects and will resolve in a few days.  5. Persistent numbness or new problems with movement should be communicated to the surgeon or the [facility] [phone] [place] [phone]).  Post Anesthesia Home Care Instructions  Activity: Get plenty of rest for the remainder of the day. A  responsible adult should stay with you for 24 hours following the procedure.  For the next 24 hours, DO NOT: -Drive a car -Paediatric nurse -Drink alcoholic beverages -Take any medication unless instructed by your physician -Make any legal decisions or sign important papers.  Meals: Start with liquid foods such as gelatin or soup. Progress to regular foods as tolerated. Avoid greasy, spicy, heavy foods. If nausea and/or vomiting occur, drink only clear liquids until the nausea and/or vomiting  subsides. Call your physician if vomiting continues.  Special Instructions/Symptoms: Your throat may feel dry or sore from the anesthesia or the breathing tube placed in your throat during surgery. If this causes discomfort, gargle with warm salt water. The discomfort should disappear within 24 hours.

## 2014-07-01 NOTE — Interval H&P Note (Signed)
History and Physical Interval Note:  07/01/2014 7:34 AM  Morgan Andrews  has presented today for surgery, with the diagnosis of LEFT SHOULDER DISORDER ARTICULAR CARTILAGE,IMPINGEMENT SYNDROME,,COMPLETE RUPTURE OF ROTATOR CUFF,BICIPITAL  The various methods of treatment have been discussed with the patient and family. After consideration of risks, benefits and other options for treatment, the patient has consented to  Procedure(s): LEFT SHOULDER ARTHROSCOPY  DEBRIDEMENT EXTENTSIVE,DISTAL CLAVICULECTOMY,DECOMPRESSION SUBACROMIAL PARTIAL ACROMIOPLASTY WITH CORACOACROMIAL RELEASE,ARTHROSCOPY SHOULDER WITH ROTATOR CUFF REPAIR. (Left) as a surgical intervention .  The patient's history has been reviewed, patient examined, no change in status, stable for surgery.  I have reviewed the patient's chart and labs.  Questions were answered to the patient's satisfaction.     Alizee Maple F

## 2014-07-02 ENCOUNTER — Encounter (HOSPITAL_BASED_OUTPATIENT_CLINIC_OR_DEPARTMENT_OTHER): Payer: Self-pay | Admitting: Orthopedic Surgery

## 2014-07-05 NOTE — Op Note (Signed)
NAMEABBIE, Morgan Andrews NO.:  [id number]  MEDICAL RECORD NO.:  [id number]  LOCATION:                                 FACILITY:  PHYSICIAN:  Ninetta Lights, M.D. DATE OF BIRTH:  Dec 01, 1961  DATE OF PROCEDURE:  07/01/2014 DATE OF DISCHARGE:  07/01/2014                              OPERATIVE REPORT   PREOPERATIVE DIAGNOSES:  Left shoulder impingement.  Calcific rotator cuff tendinitis.  Degenerative joint disease of AC joint.  POSTOPERATIVE DIAGNOSES:  Left shoulder impingement, calcific rotator cuff tendinitis.  Degenerative joint disease of AC joint with a large calcific deposit, encompassing the entire crescent region, supraspinatus tendon, with once resected revealing rotator cuff tear, also anterior labrum tear.  PROCEDURE:  Left shoulder exam under anesthesia, arthroscopy. Debridement of labrum.  Complete excision of calcific deposits from rotator cuff.  Arthroscopic fluoroscopic confirmation.  Bursectomy acromioplasty, CA ligament release.  Excision of distal clavicle. Arthroscopic-assisted rotator cuff repair.  FiberWire suture x2, SwiveLock anchors x2.  SURGEON:  Ninetta Lights, MD  ASSISTANT:  Doran Stabler, PA, present throughout the entire case and necessary for timely completion of procedure.  ANESTHESIA:  General.  BLOOD LOSS:  Minimal.  SPECIMENS:  None.  CULTURES:  None.  COMPLICATIONS:  None.  DRESSINGS:  Soft compressive shoulder immobilizer.  PROCEDURE IN DETAIL:  The patient was brought to operating room, placed on the operating table in supine position.  After adequate anesthesia had been obtained, placed in a beach-chair position with shoulder positioner, prepped and draped in usual sterile fashion.  Full motion and stable shoulder.  Three portals, anterior, posterior, and lateral. Arthroscope introduced, shoulder distended and inspected.  Complex tearing of anterior labrum debrided.  Articular cartilage, biceps tendon,  biceps anchor intact.  Some partial tearing undersurface of the cuff back half of the supraspinatus.  I did not see a full-thickness tear below and I could see the calcific deposit from below.  Cannula was redirected subacromially.  Utilizing fluoroscopic guidance as well. Type 2 acromion.  Bursa was resected.  Acromioplasty to a type 1 acromion releasing CA ligament.  Top of the cuff then well visualized. A large greater than 1 cm calcific deposit identified.  Completely excised  prior protecting up as much as possible.  Once this was excised, however, it was obvious there was a complete tear throughout the entire crescent region.  This was mobilized and debrided.  Again, fluoroscopy was used to confirm adequate excision.  With a cannula laterally,  the cuff was captured with 2 horizontal mattress sutures firmly anchored down to the tuberosity which had been roughened with 2 SwiveLock anchors.  Nice firm watertight closure achieved.  I then went over most of the distal clavicle with there were grade 3 and 4 changes. Periarticular spurs lateral centimeter of clavicle resected.  Adequacy of decompression debridement cuff repair confirmed viewing from all portals.  Instruments were removed.  Portals were closed with nylon. Sterile compressive dressing applied.  Anesthesia reversed.  Brought to the recovery room.  Tolerated the surgery well.  No complications.     Ninetta Lights, M.D.     DFM/MEDQ  D:  07/02/2014  T:  07/02/2014  Job:  [id number]

## 2014-08-09 ENCOUNTER — Telehealth: Payer: Self-pay

## 2014-08-09 NOTE — Telephone Encounter (Signed)
Line was busy. Tried multiple times.   Letter being sent to address that we have.

## 2014-08-23 ENCOUNTER — Encounter: Payer: Self-pay | Admitting: Family Medicine

## 2014-09-07 ENCOUNTER — Encounter: Payer: Self-pay | Admitting: Family Medicine

## 2014-09-07 ENCOUNTER — Ambulatory Visit (INDEPENDENT_AMBULATORY_CARE_PROVIDER_SITE_OTHER): Payer: BC Managed Care – PPO | Admitting: Family Medicine

## 2014-09-07 VITALS — BP 130/84 | HR 83 | Temp 98.2°F | Resp 16 | Ht 64.75 in | Wt 215.0 lb

## 2014-09-07 DIAGNOSIS — E669 Obesity, unspecified: Secondary | ICD-10-CM

## 2014-09-07 DIAGNOSIS — E119 Type 2 diabetes mellitus without complications: Secondary | ICD-10-CM

## 2014-09-07 LAB — BASIC METABOLIC PANEL
BUN: 10 mg/dL (ref 6–23)
CO2: 35 mEq/L — ABNORMAL HIGH (ref 19–32)
Calcium: 9.6 mg/dL (ref 8.4–10.5)
Chloride: 103 mEq/L (ref 96–112)
Creatinine, Ser: 0.9 mg/dL (ref 0.4–1.2)
GFR: 74.41 mL/min (ref >60.00)
Glucose, Bld: 99 mg/dL (ref 70–99)
Potassium: 4.3 mEq/L (ref 3.5–5.1)
Sodium: 140 mEq/L (ref 135–145)

## 2014-09-07 LAB — CBC WITH DIFFERENTIAL/PLATELET
Basophils Absolute: 0 10*3/uL (ref 0.0–0.1)
Basophils Relative: 0.6 % (ref 0.0–3.0)
Eosinophils Absolute: 0.3 10*3/uL (ref 0.0–0.7)
Eosinophils Relative: 4 % (ref 0.0–5.0)
HCT: 38.6 % (ref 36.0–46.0)
Hemoglobin: 12.4 g/dL (ref 12.0–15.0)
Lymphocytes Relative: 30.2 % (ref 12.0–46.0)
Lymphs Abs: 2.2 10*3/uL (ref 0.7–4.0)
MCHC: 32.2 g/dL (ref 30.0–36.0)
MCV: 86.6 fl (ref 78.0–100.0)
Monocytes Absolute: 0.6 10*3/uL (ref 0.1–1.0)
Monocytes Relative: 7.9 % (ref 3.0–12.0)
Neutro Abs: 4.2 10*3/uL (ref 1.4–7.7)
Neutrophils Relative %: 57.3 % (ref 43.0–77.0)
Platelets: 248 10*3/uL (ref 150.0–400.0)
RBC: 4.46 Mil/uL (ref 3.87–5.11)
RDW: 17.2 % — ABNORMAL HIGH (ref 11.5–15.5)
WBC: 7.4 10*3/uL (ref 4.0–10.5)

## 2014-09-07 LAB — LIPID PANEL
Cholesterol: 215 mg/dL — ABNORMAL HIGH (ref 0–200)
HDL: 69.4 mg/dL (ref >39.00)
LDL Cholesterol: 116 mg/dL — ABNORMAL HIGH (ref 0–99)
NonHDL: 145.6
Total CHOL/HDL Ratio: 3
Triglycerides: 149 mg/dL (ref 0.0–149.0)
VLDL: 29.8 mg/dL (ref 0.0–40.0)

## 2014-09-07 LAB — HEPATIC FUNCTION PANEL
ALT: 33 U/L (ref 0–35)
AST: 38 U/L — ABNORMAL HIGH (ref 0–37)
Albumin: 4.1 g/dL (ref 3.5–5.2)
Alkaline Phosphatase: 65 U/L (ref 39–117)
Bilirubin, Direct: 0 mg/dL (ref 0.0–0.3)
Total Bilirubin: 0.3 mg/dL (ref 0.2–1.2)
Total Protein: 7.4 g/dL (ref 6.0–8.3)

## 2014-09-07 LAB — TSH: TSH: 1.31 u[IU]/mL (ref 0.35–4.50)

## 2014-09-07 LAB — HEMOGLOBIN A1C: Hgb A1c MFr Bld: 6.8 % — ABNORMAL HIGH (ref 4.6–6.5)

## 2014-09-07 NOTE — Progress Notes (Signed)
   Subjective:    Patient ID: Morgan Andrews, female    DOB: 02/10/1961, 53 y.o.   MRN: [id number]  HPI DM- pt was told via MyChart message that got lost in translation.  Pt was unaware of dx and that is why she has not followed up.  Not currently on meds.  Has gained 15 lbs in 2 months.  No dietary changes.  Increased water intake, walking.  No medication changes.  Daughter was just dx'd as hypothyroid.  Pt denies excessive fatigue.  Denies abd pain, N/V, gas/bloating.   Review of Systems For ROS see HPI     Objective:   Physical Exam  Vitals reviewed. Constitutional: She is oriented to person, place, and time. She appears well-developed and well-nourished. No distress.  obese  HENT:  Head: Normocephalic and atraumatic.  Eyes: Conjunctivae and EOM are normal. Pupils are equal, round, and reactive to light.  Neck: Normal range of motion. Neck supple. No thyromegaly present.  Cardiovascular: Normal rate, regular rhythm, normal heart sounds and intact distal pulses.   No murmur heard. Pulmonary/Chest: Effort normal and breath sounds normal. No respiratory distress.  Abdominal: Soft. She exhibits no distension. There is no tenderness.  Musculoskeletal: She exhibits no edema.  Lymphadenopathy:    She has no cervical adenopathy.  Neurological: She is alert and oriented to person, place, and time.  Skin: Skin is warm and dry.  Psychiatric: She has a normal mood and affect. Her behavior is normal.          Assessment & Plan:

## 2014-09-07 NOTE — Progress Notes (Signed)
Pre visit review using our clinic review tool, if applicable. No additional management support is needed unless otherwise documented below in the visit note. 

## 2014-09-07 NOTE — Assessment & Plan Note (Signed)
Pt was unaware of this dx.  Reviewed A1C w/ her and what it means.  Also discussed tighter LDL control, need for eye exams.  Already on ARB.  Get labs today as baseline and adjust meds prn.  Pt appreciative of info and expressed understanding of f/u schedule.

## 2014-09-07 NOTE — Patient Instructions (Signed)
Follow up in 3 months to recheck sugar and weight loss progress Continue to work on low carb diet and get regular exercise We'll notify you of your lab results and make any changes if needed Call with any questions or concerns We can do this together!!!

## 2014-09-07 NOTE — Assessment & Plan Note (Signed)
New.  Pt reports 15 lbs of weight gain in last 2 months.  Denies any medication or lifestyle changes.  Reports she is following low carb diet despite not knowing she had diabetes- applauded these choices.  Attempting to walk regularly.  Will check thyroid to r/o abnormality that would be contributing to rapid weight gain.  Will follow closely.

## 2014-10-04 LAB — HM DIABETES EYE EXAM

## 2014-10-05 ENCOUNTER — Other Ambulatory Visit: Payer: Self-pay | Admitting: Internal Medicine

## 2014-11-08 ENCOUNTER — Ambulatory Visit (INDEPENDENT_AMBULATORY_CARE_PROVIDER_SITE_OTHER): Payer: BC Managed Care – PPO | Admitting: Family

## 2014-11-08 ENCOUNTER — Encounter: Payer: Self-pay | Admitting: Family

## 2014-11-08 VITALS — BP 142/80 | HR 84 | Temp 98.1°F | Resp 18 | Ht 64.75 in | Wt 208.6 lb

## 2014-11-08 DIAGNOSIS — J45901 Unspecified asthma with (acute) exacerbation: Secondary | ICD-10-CM | POA: Insufficient documentation

## 2014-11-08 MED ORDER — ALBUTEROL SULFATE HFA 108 (90 BASE) MCG/ACT IN AERS: 2.0000 | INHALATION_SPRAY | Freq: Four times a day (QID) | RESPIRATORY_TRACT | Status: DC | PRN

## 2014-11-08 MED ORDER — PREDNISONE 10 MG PO TABS: ORAL_TABLET | ORAL | Status: DC

## 2014-11-08 MED ORDER — METHYLPREDNISOLONE SODIUM SUCC 125 MG IJ SOLR
125.0000 mg | Freq: Once | INTRAMUSCULAR | Status: AC
  Administered 2014-11-08: 125 mg via INTRAMUSCULAR

## 2014-11-08 MED ORDER — ALBUTEROL SULFATE (2.5 MG/3ML) 0.083% IN NEBU
2.5000 mg | INHALATION_SOLUTION | Freq: Once | RESPIRATORY_TRACT | Status: AC
  Administered 2014-11-08: 2.5 mg via RESPIRATORY_TRACT

## 2014-11-08 MED ORDER — BETAMETHASONE DIPROPIONATE 0.05 % EX CREA: TOPICAL_CREAM | Freq: Two times a day (BID) | CUTANEOUS | Status: DC

## 2014-11-08 NOTE — Patient Instructions (Signed)
Start Prednisone in the morning. Use albuterol every 6 hours while awake for next 1 week.  Call if symptoms worsen or if not improved in 2-3 days.

## 2014-11-08 NOTE — Progress Notes (Signed)
Pre visit review using our clinic review tool, if applicable. No additional management support is needed unless otherwise documented below in the visit note. 

## 2014-11-08 NOTE — Assessment & Plan Note (Signed)
IM solumedrol and albuterol neb given in office. Will follow with pred taper.  Advised q6 hr albuterol MDI at home. Other symptoms most consistent with viral URI.  Pt to call if symptoms worsen or if not improved in 3 days.

## 2014-11-08 NOTE — Progress Notes (Signed)
Subjective:    Patient ID: Morgan Andrews, female    DOB: 04-27-1961, 53 y.o.   MRN: [id number]  HPI  Morgan Andrews is a 53 yr old female who presents today with chief complaint of sinus drainage.  Has been present x 3-4 days. Reports 2 day hx of dry non-productive cough. No fever. Tried cold eez and benadryl without improvement. Denies sore throat or ear pain.  Asthma- reports that this is worsened by cats. Has not been bothering her much x 15 years.    Skin rash- complains of dry cracking skin on hands.  Review of Systems See HPI  Past Medical History  Diagnosis Date  . Asthma   . Eczema   . Seasonal allergies   . Hiatal hernia   . Anxiety     classic Panic attacks with sweating & chest pain  . Cervical radiculopathy     C5-6  . Anemia   . Hypertension   . Esophageal stricture   . GERD (gastroesophageal reflux disease)   . Basal cell carcinoma     History   Social History  . Marital Status: Married    Spouse Name: N/A    Number of Children: 3  . Years of Education: N/A   Occupational History  . disalbled    Social History Main Topics  . Smoking status: Never Smoker   . Smokeless tobacco: Never Used  . Alcohol Use: Yes     Comment: OCC-WINE  . Drug Use: No  . Sexual Activity: Not on file   Other Topics Concern  . Not on file   Social History Narrative    Past Surgical History  Procedure Laterality Date  . Ankle surgery Right 1999-2002    X 5, Dr Percell Miller  . Tonsillectomy and adenoidectomy    . Cervical fusion  2008, 10/05/13    x 3 fusion, discectomy & plating , Dr Arnoldo Morale  . Wrist surgery Left   . Upper gastrointestinal endoscopy  2007    negative  . No colonoscopy    . Partial hysterectomy    . Shoulder arthroscopy with subacromial decompression, rotator cuff repair and bicep tendon repair Left 07/01/2014    Procedure: LEFT SHOULDER ARTHROSCOPY  DEBRIDEMENT EXTENTSIVE,DISTAL CLAVICULECTOMY,DECOMPRESSION SUBACROMIAL PARTIAL ACROMIOPLASTY WITH  CORACOACROMIAL RELEASE,ARTHROSCOPY SHOULDER WITH ROTATOR CUFF REPAIR.;  Surgeon: Ninetta Lights, MD;  Location: [facility];  Service: Orthopedics;  Laterality: Left;    Family History  Problem Relation Age of Onset  . Adopted: Yes  . Asthma Mother   . Hypertension Mother   . Anxiety disorder Mother     with panic attacks & depression  . Depression Mother   . Hypertension Maternal Aunt   . Stroke Maternal Aunt     >65  . Rheum arthritis Maternal Aunt   . Anxiety disorder Sister     as above  . Diabetes Neg Hx   . Colon cancer Neg Hx     Allergies  Allergen Reactions  . Clemastine Fumarate     REACTION: ASTHMA ATTACK( Note: Tavist D caused asthma flare from" excessive drying")  . Lyrica [Pregabalin]     Pedal edema    Current Outpatient Prescriptions on File Prior to Visit  Medication Sig Dispense Refill  . amitriptyline (ELAVIL) 25 MG tablet Take 1 tablet by mouth at bedtime.    . clonazePAM (KLONOPIN) 0.5 MG tablet Take 1 tablet (0.5 mg total) by mouth at bedtime as needed. 90 tablet 0  . escitalopram (  LEXAPRO) 10 MG tablet Take 10 mg by mouth at bedtime.     . gabapentin (NEURONTIN) 300 MG capsule 300 mg at bedtime.     Marland Kitchen losartan (COZAAR) 50 MG tablet TAKE 1 TABLET DAILY 90 tablet 0  . metoprolol succinate (TOPROL-XL) 25 MG 24 hr tablet TAKE 1 TABLET TWICE A DAY 180 tablet 2  . Multiple Vitamins-Iron (QC DAILY MULTIVITAMINS/IRON) TABS Take 1 tablet by mouth daily.    Marland Kitchen omeprazole (PRILOSEC) 20 MG capsule Take 1 capsule (20 mg total) by mouth daily. 90 capsule 3  . traZODone (DESYREL) 50 MG tablet TAKE 1 TABLET AT BEDTIME 90 tablet 0   No current facility-administered medications on file prior to visit.    BP 142/80 mmHg  Pulse 84  Temp(Src) 98.1 F (36.7 C) (Oral)  Resp 18  Ht 5' 4.75" (1.645 m)  Wt 208 lb 9.6 oz (94.62 kg)  BMI 34.97 kg/m2  SpO2 95%       Objective:   Physical Exam  Constitutional: She is oriented to person, place, and  time. She appears well-developed and well-nourished. No distress.  HENT:  Head: Normocephalic and atraumatic.  Right Ear: Tympanic membrane and ear canal normal.  Left Ear: Tympanic membrane and ear canal normal.  Mouth/Throat: No oropharyngeal exudate, posterior oropharyngeal edema or posterior oropharyngeal erythema.  Cardiovascular: Normal rate and regular rhythm.   No murmur heard. Pulmonary/Chest: Effort normal. No respiratory distress. She has wheezes. She has no rales. She exhibits no tenderness.  Lymphadenopathy:    She has no cervical adenopathy.  Neurological: She is alert and oriented to person, place, and time.  Psychiatric: She has a normal mood and affect. Her behavior is normal. Judgment and thought content normal.          Assessment & Plan:

## 2014-11-10 ENCOUNTER — Telehealth: Payer: Self-pay | Admitting: Internal Medicine

## 2014-11-10 NOTE — Telephone Encounter (Signed)
Caller name: stina Relation to pt: self Call back number: [phone] Pharmacy:  Reason for call:   Patient still having problems with coughing and wheezing and would like to know what else she should do? She saw [place] earlier this week.

## 2014-11-11 NOTE — Telephone Encounter (Signed)
Appointment scheduled.

## 2014-11-11 NOTE — Telephone Encounter (Signed)
Can you please schedule this pt for [facility].

## 2014-11-11 NOTE — Telephone Encounter (Signed)
Pt needs to be re-evaluated in the office please. She will likely need a chest x ray since symptoms are not improving.

## 2014-11-12 ENCOUNTER — Ambulatory Visit (INDEPENDENT_AMBULATORY_CARE_PROVIDER_SITE_OTHER): Payer: BC Managed Care – PPO | Admitting: Family

## 2014-11-12 ENCOUNTER — Encounter: Payer: Self-pay | Admitting: Family

## 2014-11-12 VITALS — BP 130/80 | HR 75 | Temp 98.2°F | Resp 16 | Ht 64.75 in | Wt 206.2 lb

## 2014-11-12 DIAGNOSIS — J45901 Unspecified asthma with (acute) exacerbation: Secondary | ICD-10-CM

## 2014-11-12 MED ORDER — AZITHROMYCIN 250 MG PO TABS: ORAL_TABLET | ORAL | Status: DC

## 2014-11-12 NOTE — Patient Instructions (Signed)
Start zithromax.   Complete prednisone. Call if symptoms worsen or if symptoms do not continue to improve.

## 2014-11-12 NOTE — Assessment & Plan Note (Signed)
Exam is improved.  Symptoms improved but not resolved. Add empiric zithromax. Follow up if symptoms worse or if symptoms do not continue to improve.

## 2014-11-12 NOTE — Progress Notes (Signed)
Pre visit review using our clinic review tool, if applicable. No additional management support is needed unless otherwise documented below in the visit note. 

## 2014-11-12 NOTE — Progress Notes (Signed)
Subjective:    Patient ID: Morgan Andrews, female    DOB: [birth year], 53 y.o.   MRN: [id number]  HPI  Morgan Andrews is a 53 yr old female who presents today for follow up of her acute asthma exacerbation. She was placed on pred taper on 11/30.  Symptoms are some better, but not completely. Mild dry cough. Denies fever.    Review of Systems See HPI  Past Medical History  Diagnosis Date  . Asthma   . Eczema   . Seasonal allergies   . Hiatal hernia   . Anxiety     classic Panic attacks with sweating & chest pain  . Cervical radiculopathy     C5-6  . Anemia   . Hypertension   . Esophageal stricture   . GERD (gastroesophageal reflux disease)   . Basal cell carcinoma     History   Social History  . Marital Status: Married    Spouse Name: N/A    Number of Children: 3  . Years of Education: N/A   Occupational History  . disalbled    Social History Main Topics  . Smoking status: Never Smoker   . Smokeless tobacco: Never Used  . Alcohol Use: Yes     Comment: OCC-WINE  . Drug Use: No  . Sexual Activity: Not on file   Other Topics Concern  . Not on file   Social History Narrative    Past Surgical History  Procedure Laterality Date  . Ankle surgery Right 1999-2002    X 5, Dr Percell Miller  . Tonsillectomy and adenoidectomy    . Cervical fusion  2008, 10/05/13    x 3 fusion, discectomy & plating , Dr Arnoldo Morale  . Wrist surgery Left   . Upper gastrointestinal endoscopy  2007    negative  . No colonoscopy    . Partial hysterectomy    . Shoulder arthroscopy with subacromial decompression, rotator cuff repair and bicep tendon repair Left 07/01/2014    Procedure: LEFT SHOULDER ARTHROSCOPY  DEBRIDEMENT EXTENTSIVE,DISTAL CLAVICULECTOMY,DECOMPRESSION SUBACROMIAL PARTIAL ACROMIOPLASTY WITH CORACOACROMIAL RELEASE,ARTHROSCOPY SHOULDER WITH ROTATOR CUFF REPAIR.;  Surgeon: Ninetta Lights, MD;  Location: [place] [facility];  Service: Orthopedics;  Laterality: Left;    Family  History  Problem Relation Age of Onset  . Adopted: Yes  . Asthma Mother   . Hypertension Mother   . Anxiety disorder Mother     with panic attacks & depression  . Depression Mother   . Hypertension Maternal Aunt   . Stroke Maternal Aunt     >65  . Rheum arthritis Maternal Aunt   . Anxiety disorder Sister     as above  . Diabetes Neg Hx   . Colon cancer Neg Hx     Allergies  Allergen Reactions  . Clemastine Fumarate     REACTION: ASTHMA ATTACK( Note: Tavist D caused asthma flare from" excessive drying")  . Lyrica [Pregabalin]     Pedal edema    Current Outpatient Prescriptions on File Prior to Visit  Medication Sig Dispense Refill  . albuterol (VENTOLIN HFA) 108 (90 BASE) MCG/ACT inhaler Inhale 2 puffs into the lungs every 6 (six) hours as needed for wheezing or shortness of breath. 1 Inhaler 0  . amitriptyline (ELAVIL) 25 MG tablet Take 1 tablet by mouth at bedtime.    . betamethasone dipropionate (DIPROLENE) 0.05 % cream Apply topically 2 (two) times daily. As needed 30 g 0  . clonazePAM (KLONOPIN) 0.5 MG tablet Take 1 tablet (  0.5 mg total) by mouth at bedtime as needed. 90 tablet 0  . escitalopram (LEXAPRO) 10 MG tablet Take 10 mg by mouth at bedtime.     . gabapentin (NEURONTIN) 300 MG capsule 300 mg at bedtime.     Marland Kitchen losartan (COZAAR) 50 MG tablet TAKE 1 TABLET DAILY 90 tablet 0  . metoprolol succinate (TOPROL-XL) 25 MG 24 hr tablet TAKE 1 TABLET TWICE A DAY 180 tablet 2  . Multiple Vitamins-Iron (QC DAILY MULTIVITAMINS/IRON) TABS Take 1 tablet by mouth daily.    Marland Kitchen omeprazole (PRILOSEC) 20 MG capsule Take 1 capsule (20 mg total) by mouth daily. 90 capsule 3  . predniSONE (DELTASONE) 10 MG tablet 4 tabs by mouth once daily x 2 days, then 3 tabs daily x 2 days, then 2 tabs daily x 2 days, then 1 tab daily x 2 days. 20 tablet 0  . traZODone (DESYREL) 50 MG tablet TAKE 1 TABLET AT BEDTIME 90 tablet 0   No current facility-administered medications on file prior to visit.     BP 130/80 mmHg  Pulse 75  Temp(Src) 98.2 F (36.8 C) (Oral)  Resp 16  Ht 5' 4.75" (1.645 m)  Wt 206 lb 3.2 oz (93.532 kg)  BMI 34.56 kg/m2  SpO2 98%       Objective:   Physical Exam  Constitutional: She is oriented to person, place, and time. She appears well-developed and well-nourished. No distress.  HENT:  Head: Normocephalic and atraumatic.  Right Ear: Tympanic membrane and ear canal normal.  Left Ear: Tympanic membrane and ear canal normal.  Mouth/Throat: No oropharyngeal exudate, posterior oropharyngeal edema or posterior oropharyngeal erythema.  Cardiovascular: Normal rate and regular rhythm.   No murmur heard. Pulmonary/Chest: Effort normal and breath sounds normal. No respiratory distress. She has no wheezes. She has no rales. She exhibits no tenderness.  Musculoskeletal: She exhibits no edema.  Neurological: She is alert and oriented to person, place, and time.  Psychiatric: She has a normal mood and affect. Her behavior is normal. Thought content normal.          Assessment & Plan:

## 2014-11-15 ENCOUNTER — Encounter: Payer: Self-pay | Admitting: Family Medicine

## 2014-11-15 ENCOUNTER — Ambulatory Visit (INDEPENDENT_AMBULATORY_CARE_PROVIDER_SITE_OTHER): Payer: BC Managed Care – PPO | Admitting: Family Medicine

## 2014-11-15 VITALS — BP 130/78 | HR 74 | Temp 98.2°F | Resp 16 | Ht 65.0 in | Wt 204.4 lb

## 2014-11-15 DIAGNOSIS — Z23 Encounter for immunization: Secondary | ICD-10-CM

## 2014-11-15 DIAGNOSIS — Z1231 Encounter for screening mammogram for malignant neoplasm of breast: Secondary | ICD-10-CM

## 2014-11-15 DIAGNOSIS — Z0000 Encounter for general adult medical examination without abnormal findings: Secondary | ICD-10-CM

## 2014-11-15 NOTE — Assessment & Plan Note (Signed)
Pt's PE WNL.  Reviewed recent labs.  Due for mammo- order entered.  No need for paps due to hysterectomy.  Anticipatory guidance provided.

## 2014-11-15 NOTE — Progress Notes (Signed)
   Subjective:    Patient ID: Morgan Andrews, female    DOB: 1961-01-03, 53 y.o.   MRN: [id number]  HPI Previous MD- Linna Darner.  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  Due for mammo- was getting at work but has been out on disability.  UTD on colonoscopy.  UTD on eye exam.   Review of Systems Patient reports no vision/ hearing changes, adenopathy,fever, weight change,  persistant/recurrent hoarseness , swallowing issues, chest pain, palpitations, edema, persistant/recurrent cough, hemoptysis, dyspnea (rest/exertional/paroxysmal nocturnal), gastrointestinal bleeding (melena, rectal bleeding), abdominal pain, significant heartburn, bowel changes, GU symptoms (dysuria, hematuria, incontinence), Gyn symptoms (abnormal  bleeding, pain),  syncope, focal weakness, memory loss, numbness & tingling, skin/hair/nail changes, abnormal bruising or bleeding, anxiety, or depression.     Objective:   Physical Exam General Appearance:    Alert, cooperative, no distress, appears stated age  Head:    Normocephalic, without obvious abnormality, atraumatic  Eyes:    PERRL, conjunctiva/corneas clear, EOM's intact, fundi    benign, both eyes  Ears:    Normal TM's and external ear canals, both ears  Nose:   Nares normal, septum midline, mucosa normal, no drainage    or sinus tenderness  Throat:   Lips, mucosa, and tongue normal; teeth and gums normal  Neck:   Supple, symmetrical, trachea midline, no adenopathy;    Thyroid: no enlargement/tenderness/nodules  Back:     Symmetric, no curvature, ROM normal, no CVA tenderness  Lungs:     Clear to auscultation bilaterally, respirations unlabored  Chest Wall:    No tenderness or deformity   Heart:    Regular rate and rhythm, S1 and S2 normal, no murmur, rub   or gallop  Breast Exam:    Deferred to GYN  Abdomen:     Soft, non-tender, bowel sounds active all four quadrants,    no masses, no organomegaly  Genitalia:    Deferred to GYN  Rectal:    Extremities:   Extremities  normal, atraumatic, no cyanosis or edema  Pulses:   2+ and symmetric all extremities  Skin:   Skin color, texture, turgor normal, no rashes or lesions  Lymph nodes:   Cervical, supraclavicular, and axillary nodes normal  Neurologic:   CNII-XII intact, normal strength, sensation and reflexes    throughout          Assessment & Plan:

## 2014-11-15 NOTE — Patient Instructions (Signed)
Follow up in 3 months to recheck diabetes No labs at this point- we'll check them next time Keep up the good work on healthy diet and regular exercise FungiNail on the nails [place] as needed Call with any questions or concerns Happy Holidays!!!

## 2014-11-15 NOTE — Progress Notes (Signed)
Pre visit review using our clinic review tool, if applicable. No additional management support is needed unless otherwise documented below in the visit note. 

## 2014-11-19 ENCOUNTER — Ambulatory Visit (HOSPITAL_BASED_OUTPATIENT_CLINIC_OR_DEPARTMENT_OTHER): Payer: BC Managed Care – PPO

## 2014-11-19 ENCOUNTER — Encounter: Payer: Self-pay | Admitting: Family Medicine

## 2014-11-30 ENCOUNTER — Encounter: Payer: Self-pay | Admitting: Physician Assistant

## 2014-11-30 ENCOUNTER — Telehealth: Payer: Self-pay | Admitting: Family Medicine

## 2014-11-30 ENCOUNTER — Ambulatory Visit (INDEPENDENT_AMBULATORY_CARE_PROVIDER_SITE_OTHER): Payer: BC Managed Care – PPO | Admitting: Physician Assistant

## 2014-11-30 VITALS — BP 125/76 | HR 80 | Temp 98.7°F | Resp 18 | Ht 65.0 in | Wt 205.5 lb

## 2014-11-30 DIAGNOSIS — J329 Chronic sinusitis, unspecified: Secondary | ICD-10-CM

## 2014-11-30 DIAGNOSIS — J310 Chronic rhinitis: Secondary | ICD-10-CM | POA: Insufficient documentation

## 2014-11-30 DIAGNOSIS — J45901 Unspecified asthma with (acute) exacerbation: Secondary | ICD-10-CM | POA: Insufficient documentation

## 2014-11-30 MED ORDER — ALBUTEROL SULFATE HFA 108 (90 BASE) MCG/ACT IN AERS: 2.0000 | INHALATION_SPRAY | Freq: Four times a day (QID) | RESPIRATORY_TRACT | Status: DC | PRN

## 2014-11-30 MED ORDER — CEFDINIR 300 MG PO CAPS: 300.0000 mg | ORAL_CAPSULE | Freq: Two times a day (BID) | ORAL | Status: DC

## 2014-11-30 MED ORDER — METHYLPREDNISOLONE (PAK) 4 MG PO TABS: ORAL_TABLET | ORAL | Status: DC

## 2014-11-30 NOTE — Assessment & Plan Note (Signed)
Mild, bloody PND also noted.  Humidifier in bedroom.  Flonase daily. Increase fluids.  Rx Cefdinir given.

## 2014-11-30 NOTE — Addendum Note (Signed)
Addended by: Raiford Noble on: 11/30/2014 04:42 PM   Modules accepted: Orders, SmartSet

## 2014-11-30 NOTE — Telephone Encounter (Signed)
Pt expercicing shortness of breath and coughing up blood transferred to [facility]

## 2014-11-30 NOTE — Assessment & Plan Note (Signed)
Wheezes much improved with Duoneb. Rx Cefdinir.  Repeat Medrol dose pack.  Humidifier in bedroom. Plain Mucinex.  Follow-up in 1 week.

## 2014-11-30 NOTE — Progress Notes (Signed)
Pre visit review using our clinic review tool, if applicable. No additional management support is needed unless otherwise documented below in the visit note/SLS  

## 2014-11-30 NOTE — Telephone Encounter (Signed)
LM on voice mail of recommendations for appt today. Scheduled her for 345 with [place].  Spoke with husband who will make sure that she is at appt.

## 2014-11-30 NOTE — Patient Instructions (Addendum)
Please stay well hydrated. Place a humidifier in the bedroom.  Take antibiotic as directed.   Continue albuterol inhaler.  The blood you noticed was coming from your sinuses, likely due to irritation and inflammation. The steroid and antibiotic should help, but get some over-the-counter Flonase and take daily.  Follow-up with me in 1 week.

## 2014-11-30 NOTE — Progress Notes (Signed)
Patient presents to clinic today c/o continued chest congestion, cough, PND, wheezing and shortness of breath.  Patient recently treated for resolving asthma exacerbation with a Z-pack.  Endorses taking as directed.  Notes initial improvement with ABX but now with continued dry cough, chest tightness and purulent rhinorrhea. Endorses some red streaking in sputum today only.     Past Medical History  Diagnosis Date  . Asthma   . Eczema   . Seasonal allergies   . Hiatal hernia   . Anxiety     classic Panic attacks with sweating & chest pain  . Cervical radiculopathy     C5-6  . Anemia   . Hypertension   . Esophageal stricture   . GERD (gastroesophageal reflux disease)   . Basal cell carcinoma   . Diabetes mellitus without complication     Current Outpatient Prescriptions on File Prior to Visit  Medication Sig Dispense Refill  . albuterol (VENTOLIN HFA) 108 (90 BASE) MCG/ACT inhaler Inhale 2 puffs into the lungs every 6 (six) hours as needed for wheezing or shortness of breath. 1 Inhaler 0  . amitriptyline (ELAVIL) 25 MG tablet Pt takes 1-3 tablets by mouth at bedtime    . betamethasone dipropionate (DIPROLENE) 0.05 % cream Apply topically 2 (two) times daily. As needed 30 g 0  . clonazePAM (KLONOPIN) 0.5 MG tablet Pt takes 2 tablets by mouth at bedtime as needed.    Marland Kitchen escitalopram (LEXAPRO) 10 MG tablet Take 10 mg by mouth at bedtime.     . gabapentin (NEURONTIN) 300 MG capsule 300 mg at bedtime.     Marland Kitchen losartan (COZAAR) 50 MG tablet TAKE 1 TABLET DAILY 90 tablet 0  . metoprolol succinate (TOPROL-XL) 25 MG 24 hr tablet TAKE 1 TABLET TWICE A DAY 180 tablet 2  . Multiple Vitamins-Iron (QC DAILY MULTIVITAMINS/IRON) TABS Take 1 tablet by mouth daily.    Marland Kitchen omeprazole (PRILOSEC) 20 MG capsule Take 1 capsule (20 mg total) by mouth daily. 90 capsule 3  . traZODone (DESYREL) 100 MG tablet Take 100 mg by mouth at bedtime.     No current facility-administered medications on file prior to  visit.    Allergies  Allergen Reactions  . Clemastine Fumarate     REACTION: ASTHMA ATTACK( Note: Tavist D caused asthma flare from" excessive drying")  . Lyrica [Pregabalin]     Pedal edema    Family History  Problem Relation Age of Onset  . Adopted: Yes  . Asthma Mother   . Hypertension Mother   . Anxiety disorder Mother     with panic attacks & depression  . Depression Mother   . Hypertension Maternal Aunt   . Stroke Maternal Aunt     >65  . Rheum arthritis Maternal Aunt   . Anxiety disorder Sister     as above  . Diabetes Neg Hx   . Colon cancer Neg Hx     History   Social History  . Marital Status: Married    Spouse Name: N/A    Number of Children: 3  . Years of Education: N/A   Occupational History  . disalbled    Social History Main Topics  . Smoking status: Never Smoker   . Smokeless tobacco: Never Used  . Alcohol Use: Yes     Comment: OCC-WINE  . Drug Use: No  . Sexual Activity: None   Other Topics Concern  . None   Social History Narrative   Review of Systems - See  HPI.  All other ROS are negative.  BP 125/76 mmHg  Pulse 80  Temp(Src) 98.7 F (37.1 C) (Oral)  Resp 18  Ht 5\' 5"  (1.651 m)  Wt 205 lb 8 oz (93.214 kg)  BMI 34.20 kg/m2  SpO2 95%  Physical Exam  Constitutional: She is oriented to person, place, and time and well-developed, well-nourished, and in no distress.  HENT:  Head: Normocephalic and atraumatic.  Right Ear: Tympanic membrane, external ear and ear canal normal.  Left Ear: Tympanic membrane, external ear and ear canal normal.  Nose: Mucosal edema and rhinorrhea present. Right sinus exhibits maxillary sinus tenderness. Left sinus exhibits maxillary sinus tenderness.  Mouth/Throat: Uvula is midline. No oropharyngeal exudate, posterior oropharyngeal edema, posterior oropharyngeal erythema or tonsillar abscesses.  Thin, bloody PND noted.  Eyes: Conjunctivae are normal. Pupils are equal, round, and reactive to light.    Neck: Neck supple.  Cardiovascular: Normal rate, regular rhythm, normal heart sounds and intact distal pulses.   Pulmonary/Chest: Effort normal. No respiratory distress. She has wheezes. She has no rales. She exhibits no tenderness.  Lymphadenopathy:    She has no cervical adenopathy.  Neurological: She is alert and oriented to person, place, and time.  Skin: Skin is warm and dry. No rash noted.  Psychiatric: Affect normal.    Recent Results (from the past 2160 hour(s))  Hemoglobin A1c     Status: Abnormal   Collection Time: 09/07/14 11:52 AM  Result Value Ref Range   Hgb A1c MFr Bld 6.8 (H) 4.6 - 6.5 %    Comment: Glycemic Control Guidelines for People with Diabetes:Non Diabetic:  <6%Goal of Therapy: <7%Additional Action Suggested:  >8%   Lipid panel     Status: Abnormal   Collection Time: 09/07/14 11:52 AM  Result Value Ref Range   Cholesterol 215 (H) 0 - 200 mg/dL    Comment: ATP III Classification       Desirable:  < 200 mg/dL               Borderline High:  200 - 239 mg/dL          High:  > = 240 mg/dL   Triglycerides 149.0 0.0 - 149.0 mg/dL    Comment: Normal:  <150 mg/dLBorderline High:  150 - 199 mg/dL   HDL 69.40 >39.00 mg/dL   VLDL 29.8 0.0 - 40.0 mg/dL   LDL Cholesterol 116 (H) 0 - 99 mg/dL   Total CHOL/HDL Ratio 3     Comment:                Men          Women1/2 Average Risk     3.4          3.3Average Risk          5.0          4.42X Average Risk          9.6          7.13X Average Risk          15.0          11.0                       NonHDL 145.60     Comment: NOTE:  Non-HDL goal should be 30 mg/dL higher than patient's LDL goal (i.e. LDL goal of < 70 mg/dL, would have non-HDL goal of < 100 mg/dL)  Basic metabolic panel  Status: Abnormal   Collection Time: 09/07/14 11:52 AM  Result Value Ref Range   Sodium 140 135 - 145 mEq/L   Potassium 4.3 3.5 - 5.1 mEq/L   Chloride 103 96 - 112 mEq/L   CO2 35 (H) 19 - 32 mEq/L   Glucose, Bld 99 70 - 99 mg/dL   BUN 10 6 -  23 mg/dL   Creatinine, Ser 0.9 0.4 - 1.2 mg/dL   Calcium 9.6 8.4 - 10.5 mg/dL   GFR 74.41 >60.00 mL/min  TSH     Status: None   Collection Time: 09/07/14 11:52 AM  Result Value Ref Range   TSH 1.31 0.35 - 4.50 uIU/mL  Hepatic function panel     Status: Abnormal   Collection Time: 09/07/14 11:52 AM  Result Value Ref Range   Total Bilirubin 0.3 0.2 - 1.2 mg/dL   Bilirubin, Direct 0.0 0.0 - 0.3 mg/dL   Alkaline Phosphatase 65 39 - 117 U/L   AST 38 (H) 0 - 37 U/L   ALT 33 0 - 35 U/L   Total Protein 7.4 6.0 - 8.3 g/dL   Albumin 4.1 3.5 - 5.2 g/dL  CBC with Differential     Status: Abnormal   Collection Time: 09/07/14 11:52 AM  Result Value Ref Range   WBC 7.4 4.0 - 10.5 K/uL   RBC 4.46 3.87 - 5.11 Mil/uL   Hemoglobin 12.4 12.0 - 15.0 g/dL   HCT 38.6 36.0 - 46.0 %   MCV 86.6 78.0 - 100.0 fl   MCHC 32.2 30.0 - 36.0 g/dL   RDW 17.2 (H) 11.5 - 15.5 %   Platelets 248.0 150.0 - 400.0 K/uL   Neutrophils Relative % 57.3 43.0 - 77.0 %   Lymphocytes Relative 30.2 12.0 - 46.0 %   Monocytes Relative 7.9 3.0 - 12.0 %   Eosinophils Relative 4.0 0.0 - 5.0 %   Basophils Relative 0.6 0.0 - 3.0 %   Neutro Abs 4.2 1.4 - 7.7 K/uL   Lymphs Abs 2.2 0.7 - 4.0 K/uL   Monocytes Absolute 0.6 0.1 - 1.0 K/uL   Eosinophils Absolute 0.3 0.0 - 0.7 K/uL   Basophils Absolute 0.0 0.0 - 0.1 K/uL  HM DIABETES EYE EXAM     Status: None   Collection Time: 10/04/14 12:00 AM  Result Value Ref Range   HM Diabetic Eye Exam No Retinopathy No Retinopathy    Assessment/Plan: Rhinosinusitis Mild, bloody PND also noted.  Humidifier in bedroom.  Flonase daily. Increase fluids.  Rx Cefdinir given.  Asthmatic bronchitis with acute exacerbation Wheezes much improved with Duoneb. Rx Cefdinir.  Repeat Medrol dose pack.  Humidifier in bedroom. Plain Mucinex.  Follow-up in 1 week.

## 2014-12-01 ENCOUNTER — Emergency Department (HOSPITAL_BASED_OUTPATIENT_CLINIC_OR_DEPARTMENT_OTHER)
Admission: EM | Admit: 2014-12-01 | Discharge: 2014-12-01 | Disposition: A | Discharge status: Other Acute Inpatient Hospital | Payer: BC Managed Care – PPO | Attending: Emergency Medicine | Admitting: Emergency Medicine

## 2014-12-01 ENCOUNTER — Encounter (HOSPITAL_BASED_OUTPATIENT_CLINIC_OR_DEPARTMENT_OTHER): Payer: Self-pay | Admitting: *Deleted

## 2014-12-01 ENCOUNTER — Emergency Department (HOSPITAL_BASED_OUTPATIENT_CLINIC_OR_DEPARTMENT_OTHER): Payer: BC Managed Care – PPO

## 2014-12-01 DIAGNOSIS — R000 Tachycardia, unspecified: Secondary | ICD-10-CM | POA: Insufficient documentation

## 2014-12-01 DIAGNOSIS — Z872 Personal history of diseases of the skin and subcutaneous tissue: Secondary | ICD-10-CM | POA: Insufficient documentation

## 2014-12-01 DIAGNOSIS — R0602 Shortness of breath: Secondary | ICD-10-CM | POA: Diagnosis present

## 2014-12-01 DIAGNOSIS — Z85828 Personal history of other malignant neoplasm of skin: Secondary | ICD-10-CM | POA: Insufficient documentation

## 2014-12-01 DIAGNOSIS — Z862 Personal history of diseases of the blood and blood-forming organs and certain disorders involving the immune mechanism: Secondary | ICD-10-CM | POA: Diagnosis not present

## 2014-12-01 DIAGNOSIS — E119 Type 2 diabetes mellitus without complications: Secondary | ICD-10-CM | POA: Diagnosis not present

## 2014-12-01 DIAGNOSIS — F419 Anxiety disorder, unspecified: Secondary | ICD-10-CM | POA: Diagnosis not present

## 2014-12-01 DIAGNOSIS — I10 Essential (primary) hypertension: Secondary | ICD-10-CM | POA: Insufficient documentation

## 2014-12-01 DIAGNOSIS — J45901 Unspecified asthma with (acute) exacerbation: Secondary | ICD-10-CM | POA: Diagnosis not present

## 2014-12-01 DIAGNOSIS — K219 Gastro-esophageal reflux disease without esophagitis: Secondary | ICD-10-CM | POA: Insufficient documentation

## 2014-12-01 DIAGNOSIS — Z79899 Other long term (current) drug therapy: Secondary | ICD-10-CM | POA: Diagnosis not present

## 2014-12-01 LAB — BASIC METABOLIC PANEL
Anion gap: 10 (ref 5–15)
BUN: 9 mg/dL (ref 6–23)
CO2: 28 mmol/L (ref 19–32)
Calcium: 9.4 mg/dL (ref 8.4–10.5)
Chloride: 103 mEq/L (ref 96–112)
Creatinine, Ser: 0.79 mg/dL (ref 0.50–1.10)
GFR calc Af Amer: >90 mL/min (ref >90)
GFR calc non Af Amer: >90 mL/min (ref >90)
Glucose, Bld: 151 mg/dL — ABNORMAL HIGH (ref 70–99)
Potassium: 3.2 mmol/L — ABNORMAL LOW (ref 3.5–5.1)
Sodium: 141 mmol/L (ref 135–145)

## 2014-12-01 LAB — CBC WITH DIFFERENTIAL/PLATELET
Basophils Absolute: 0 10*3/uL (ref 0.0–0.1)
Basophils Relative: 1 % (ref 0–1)
Eosinophils Absolute: 0.2 10*3/uL (ref 0.0–0.7)
Eosinophils Relative: 2 % (ref 0–5)
HCT: 39.2 % (ref 36.0–46.0)
Hemoglobin: 12.3 g/dL (ref 12.0–15.0)
Lymphocytes Relative: 15 % (ref 12–46)
Lymphs Abs: 1.2 10*3/uL (ref 0.7–4.0)
MCH: 27.4 pg (ref 26.0–34.0)
MCHC: 31.4 g/dL (ref 30.0–36.0)
MCV: 87.3 fL (ref 78.0–100.0)
Monocytes Absolute: 0.3 10*3/uL (ref 0.1–1.0)
Monocytes Relative: 4 % (ref 3–12)
Neutro Abs: 6 10*3/uL (ref 1.7–7.7)
Neutrophils Relative %: 78 % — ABNORMAL HIGH (ref 43–77)
Platelets: 192 10*3/uL (ref 150–400)
RBC: 4.49 MIL/uL (ref 3.87–5.11)
RDW: 15.4 % (ref 11.5–15.5)
WBC: 7.6 10*3/uL (ref 4.0–10.5)

## 2014-12-01 MED ORDER — ACETAMINOPHEN 500 MG PO TABS
1000.0000 mg | ORAL_TABLET | Freq: Once | ORAL | Status: AC
  Administered 2014-12-01: 1000 mg via ORAL
  Filled 2014-12-01: qty 2

## 2014-12-01 MED ORDER — IPRATROPIUM BROMIDE 0.02 % IN SOLN
0.5000 mg | Freq: Once | RESPIRATORY_TRACT | Status: AC
  Administered 2014-12-01: 0.5 mg via RESPIRATORY_TRACT
  Filled 2014-12-01: qty 2.5

## 2014-12-01 MED ORDER — SODIUM CHLORIDE 0.9 % IV BOLUS (SEPSIS)
1000.0000 mL | Freq: Once | INTRAVENOUS | Status: AC
  Administered 2014-12-01: 1000 mL via INTRAVENOUS

## 2014-12-01 MED ORDER — POTASSIUM CHLORIDE CRYS ER 20 MEQ PO TBCR
40.0000 meq | EXTENDED_RELEASE_TABLET | Freq: Once | ORAL | Status: AC
  Administered 2014-12-01: 40 meq via ORAL
  Filled 2014-12-01: qty 2

## 2014-12-01 MED ORDER — ALBUTEROL (5 MG/ML) CONTINUOUS INHALATION SOLN
15.0000 mg/h | INHALATION_SOLUTION | RESPIRATORY_TRACT | Status: AC
  Administered 2014-12-01: 15 mg/h via RESPIRATORY_TRACT

## 2014-12-01 MED ORDER — IPRATROPIUM-ALBUTEROL 0.5-2.5 (3) MG/3ML IN SOLN: 3.0000 mL | RESPIRATORY_TRACT | Status: DC

## 2014-12-01 MED ORDER — PREDNISONE 50 MG PO TABS
60.0000 mg | ORAL_TABLET | Freq: Once | ORAL | Status: AC
  Administered 2014-12-01: 60 mg via ORAL
  Filled 2014-12-01 (×2): qty 1

## 2014-12-01 MED ORDER — ALBUTEROL (5 MG/ML) CONTINUOUS INHALATION SOLN
INHALATION_SOLUTION | RESPIRATORY_TRACT | Status: AC
  Filled 2014-12-01: qty 20

## 2014-12-01 MED ORDER — MAGNESIUM SULFATE 2 GM/50ML IV SOLN
2.0000 g | Freq: Once | INTRAVENOUS | Status: AC
  Administered 2014-12-01: 2 g via INTRAVENOUS
  Filled 2014-12-01: qty 50

## 2014-12-01 MED ORDER — IPRATROPIUM-ALBUTEROL 0.5-2.5 (3) MG/3ML IN SOLN
3.0000 mL | Freq: Once | RESPIRATORY_TRACT | Status: AC
  Administered 2014-11-30: 3 mL via RESPIRATORY_TRACT

## 2014-12-01 NOTE — ED Notes (Signed)
Report called to [place] at [facility].

## 2014-12-01 NOTE — ED Notes (Signed)
Pt c/o SOB x 5 mins with HX asthma

## 2014-12-01 NOTE — ED Notes (Signed)
Report given to [facility].

## 2014-12-01 NOTE — ED Provider Notes (Signed)
CSN: [id number]     Arrival date & time 12/01/14  1547 History   First MD Initiated Contact with Patient 12/01/14 1555     Chief Complaint  Patient presents with  . Shortness of Breath     (Consider location/radiation/quality/duration/timing/severity/associated sxs/prior Treatment) HPI  53 year old female presents with wheezing, shortness of breath, and cough for the past 2-3 days. No fevers. Went to her PCP yesterday and was diagnosed with rhinosinusitis, given a prescription for antibiotics, given 1 DuoNeb with good improvement, and discharged with Medrol Dosepak. She felt better after the DuoNeb yesterday but the symptoms have recurred and worsened. She took her albuterol inhaler at home with no good relief. She states she's not been to the ER in several years for an asthma exacerbation. She rarely needs her inhaler. She has not filled the steroid prescription yet has not had any steroids.  Past Medical History  Diagnosis Date  . Asthma   . Eczema   . Seasonal allergies   . Hiatal hernia   . Anxiety     classic Panic attacks with sweating & chest pain  . Cervical radiculopathy     C5-6  . Anemia   . Hypertension   . Esophageal stricture   . GERD (gastroesophageal reflux disease)   . Basal cell carcinoma   . Diabetes mellitus without complication    Past Surgical History  Procedure Laterality Date  . Ankle surgery Right 1999-2002    X 5, Dr Percell Miller  . Tonsillectomy and adenoidectomy    . Cervical fusion  2008, 10/05/13    x 3 fusion, discectomy & plating , Dr Arnoldo Morale  . Wrist surgery Left   . Upper gastrointestinal endoscopy  2007    negative  . No colonoscopy    . Partial hysterectomy    . Shoulder arthroscopy with subacromial decompression, rotator cuff repair and bicep tendon repair Left 07/01/2014    Procedure: LEFT SHOULDER ARTHROSCOPY  DEBRIDEMENT EXTENTSIVE,DISTAL CLAVICULECTOMY,DECOMPRESSION SUBACROMIAL PARTIAL ACROMIOPLASTY WITH CORACOACROMIAL RELEASE,ARTHROSCOPY  SHOULDER WITH ROTATOR CUFF REPAIR.;  Surgeon: Ninetta Lights, MD;  Location: [place];  Service: Orthopedics;  Laterality: Left;   Family History  Problem Relation Age of Onset  . Adopted: Yes  . Asthma Mother   . Hypertension Mother   . Anxiety disorder Mother     with panic attacks & depression  . Depression Mother   . Hypertension Maternal Aunt   . Stroke Maternal Aunt     >65  . Rheum arthritis Maternal Aunt   . Anxiety disorder Sister     as above  . Diabetes Neg Hx   . Colon cancer Neg Hx    History  Substance Use Topics  . Smoking status: Never Smoker   . Smokeless tobacco: Never Used  . Alcohol Use: Yes     Comment: OCC-WINE   OB History    No data available     Review of Systems  Constitutional: Negative for fever.  HENT: Positive for congestion.   Respiratory: Positive for cough, shortness of breath and wheezing.   All other systems reviewed and are negative.     Allergies  Clemastine fumarate and Lyrica  Home Medications   Prior to Admission medications   Medication Sig Start Date End Date Taking? Authorizing Provider  albuterol (VENTOLIN HFA) 108 (90 BASE) MCG/ACT inhaler Inhale 2 puffs into the lungs every 6 (six) hours as needed for wheezing or shortness of breath. 11/30/14   Brunetta Jeans, PA-C  amitriptyline (  ELAVIL) 25 MG tablet Pt takes 1-3 tablets by mouth at bedtime 05/05/13   Historical Provider, MD  betamethasone dipropionate (DIPROLENE) 0.05 % cream Apply topically 2 (two) times daily. As needed 11/08/14   Debbrah Alar, NP  cefdinir (OMNICEF) 300 MG capsule Take 1 capsule (300 mg total) by mouth 2 (two) times daily. 11/30/14   Brunetta Jeans, PA-C  clonazePAM (KLONOPIN) 0.5 MG tablet Pt takes 2 tablets by mouth at bedtime as needed.    Historical Provider, MD  escitalopram (LEXAPRO) 10 MG tablet Take 10 mg by mouth at bedtime.  02/17/14   Historical Provider, MD  gabapentin (NEURONTIN) 300 MG capsule 300 mg at  bedtime.  11/02/13   Historical Provider, MD  losartan (COZAAR) 50 MG tablet TAKE 1 TABLET DAILY 10/05/14   Hendricks Limes, MD  metoprolol succinate (TOPROL-XL) 25 MG 24 hr tablet TAKE 1 TABLET TWICE A DAY 06/10/14   Hendricks Limes, MD  Multiple Vitamins-Iron (QC DAILY MULTIVITAMINS/IRON) TABS Take 1 tablet by mouth daily. 03/18/14   Irene Shipper, MD  omeprazole (PRILOSEC) 20 MG capsule Take 1 capsule (20 mg total) by mouth daily. 01/04/14   Irene Shipper, MD  traZODone (DESYREL) 100 MG tablet Take 100 mg by mouth at bedtime.    Historical Provider, MD   BP 165/95 mmHg  Pulse 120  Temp(Src) 97.9 F (36.6 C) (Oral)  Resp 22  Ht 5\' 5"  (1.651 m)  Wt 200 lb (90.719 kg)  BMI 33.28 kg/m2  SpO2 96% Physical Exam  Constitutional: She is oriented to person, place, and time. She appears well-developed and well-nourished.  HENT:  Head: Normocephalic and atraumatic.  Right Ear: External ear normal.  Left Ear: External ear normal.  Nose: Nose normal.  Eyes: Right eye exhibits no discharge. Left eye exhibits no discharge.  Cardiovascular: Regular rhythm and normal heart sounds.  Tachycardia present.   Pulmonary/Chest: Effort normal. No accessory muscle usage. Tachypnea noted. No respiratory distress. She has wheezes (Diffuse expiratory wheezes).  Abdominal: She exhibits no distension.  Neurological: She is alert and oriented to person, place, and time.  Skin: Skin is warm and dry.  Nursing note and vitals reviewed.   ED Course  Procedures (including critical care time) Labs Review Labs Reviewed  BASIC METABOLIC PANEL - Abnormal; Notable for the following:    Potassium 3.2 (*)    Glucose, Bld 151 (*)    All other components within normal limits  CBC WITH DIFFERENTIAL - Abnormal; Notable for the following:    Neutrophils Relative % 78 (*)    All other components within normal limits    Imaging Review Dg Chest Port 1 View  12/01/2014   CLINICAL DATA:  53 year old female with shortness of  breath and chest discomfort. History of asthma, high blood pressure and diabetes. Nonsmoker.  EXAM: PORTABLE CHEST - 1 VIEW  COMPARISON:  07/08/2006 CT.  06/25/2006 chest x-ray.  FINDINGS: Mildly tortuous ascending thoracic aorta.  Heart size within normal limits.  Mild central pulmonary vascular prominence without pulmonary edema.  No segmental infiltrate or gross pneumothorax.  The patient would eventually benefit from two view chest with cardiac leads removed.  IMPRESSION: Mildly tortuous ascending thoracic aorta.  No infiltrate or congestive heart.  Please see above   Electronically Signed   By: Chauncey Cruel M.D.   On: 12/01/2014 16:35     EKG Interpretation None      CRITICAL CARE Performed by: Sherwood Gambler T   Total critical care  time: 45 minutes  Critical care time was exclusive of separately billable procedures and treating other patients.  Critical care was necessary to treat or prevent imminent or life-threatening deterioration.  Critical care was time spent personally by me on the following activities: development of treatment plan with patient and/or surrogate as well as nursing, discussions with consultants, evaluation of patient's response to treatment, examination of patient, obtaining history from patient or surrogate, ordering and performing treatments and interventions, ordering and review of laboratory studies, ordering and review of radiographic studies, pulse oximetry and re-evaluation of patient's condition.  MDM   Final diagnoses:  Asthma exacerbation    Patient had only moderate improvement with 2 hours of continuous albuterol. Was given steroids, magnesium added later. While patient has improved she still has significant wheezing and shortness of breath at rest. Borderline oxygen saturations on room air. Due to this, will admit to the hospital for asthma exacerbation and further respiratory support. I discussed with Dr. Jennette Banker of [facility] who accepts in  transfer for admission.    Ephraim Hamburger, MD 12/02/14 [phone]

## 2014-12-01 NOTE — ED Notes (Addendum)
Spoke with [facility] rep Baxter Flattery, placing call for Tx to [facility] room 652. Room assignment and facesheet giving to [facility]

## 2014-12-01 NOTE — ED Notes (Signed)
Pt released to [facility] care for transport to [facility].

## 2014-12-01 NOTE — Addendum Note (Signed)
Addended by: Rockwell Germany on: 12/01/2014 02:42 PM   Modules accepted: Orders

## 2014-12-07 ENCOUNTER — Telehealth: Payer: Self-pay

## 2014-12-07 NOTE — Telephone Encounter (Addendum)
Admission Date:  12/01/14 Discharge Date:  12/03/14 Facility:  [facility]  Reason for admission:  Asthma exacerbation and DM  Transition Care Management Follow-up Telephone Call  How have you been since you were released from the hospital?  Still come chest tightness and shortness of breath.  However, symptoms are being controlled with albuterol inhaler.     Do you understand why you were in the hospital? yes   Do you understand the discharge instructions? yes  Items Reviewed:  Medications reviewed: yes  Allergies reviewed: yes  Dietary changes reviewed: yes, carb consistent diet  Referrals reviewed: n/a    Functional Questionnaire:   Activities of Daily Living (ADLs):   She states they are independent in the following: ambulation, bathing and hygiene, feeding, continence, grooming, toileting and dressing States they require assistance with the following: none   Any transportation issues/concerns?: no   Any patient concerns? no   Confirmed importance and date/time of follow-up visits scheduled: yes   Confirmed with patient if condition begins to worsen call PCP or go to the ER: yes   Hospital follow up appointment scheduled for Tuesday, 12/14/14 @ 11 am with Dr. Birdie Riddle.

## 2014-12-11 ENCOUNTER — Other Ambulatory Visit: Payer: Self-pay | Admitting: Internal Medicine

## 2014-12-14 ENCOUNTER — Ambulatory Visit: Payer: BC Managed Care – PPO | Admitting: Family Medicine

## 2014-12-20 NOTE — Telephone Encounter (Addendum)
Called to follow up with patient. Pt states that she has been feeling much better.  Symptoms resolved.  States new insurance will only allow two office visits a year with $50 co-pay.  Pt stated that since her symptoms had improved, she cancelled her appointment.  No needs at this time.  Pt encouraged to call with further needs or concerns.

## 2014-12-25 ENCOUNTER — Other Ambulatory Visit: Payer: Self-pay | Admitting: Internal Medicine

## 2015-01-04 ENCOUNTER — Telehealth: Payer: Self-pay | Admitting: Family Medicine

## 2015-01-04 MED ORDER — LOSARTAN POTASSIUM 50 MG PO TABS: 50.0000 mg | ORAL_TABLET | Freq: Every day | ORAL | Status: DC

## 2015-01-04 NOTE — Telephone Encounter (Signed)
Med filled.  

## 2015-01-04 NOTE — Telephone Encounter (Signed)
Caller name: Vibol from [facility] Relation to pt: Call back number: [phone] Pharmacy:  Reason for call:   Calling for patient. Requesting refill of losartan. Ref# [id number]

## 2015-02-07 ENCOUNTER — Other Ambulatory Visit: Payer: Self-pay | Admitting: Internal Medicine

## 2015-02-08 ENCOUNTER — Other Ambulatory Visit: Payer: Self-pay | Admitting: General Practice

## 2015-02-08 MED ORDER — METOPROLOL SUCCINATE ER 25 MG PO TB24: 25.0000 mg | ORAL_TABLET | Freq: Two times a day (BID) | ORAL | Status: DC

## 2015-02-14 ENCOUNTER — Ambulatory Visit: Payer: BC Managed Care – PPO | Admitting: Family Medicine

## 2015-03-14 ENCOUNTER — Telehealth: Payer: Self-pay | Admitting: Internal Medicine

## 2015-03-14 MED ORDER — OMEPRAZOLE 20 MG PO CPDR: 20.0000 mg | DELAYED_RELEASE_CAPSULE | Freq: Every day | ORAL | Status: DC

## 2015-03-15 NOTE — Telephone Encounter (Signed)
Pantoprazole sent to [facility] per patient's request

## 2015-03-17 ENCOUNTER — Other Ambulatory Visit: Payer: Self-pay | Admitting: Family Medicine

## 2015-03-17 MED ORDER — LOSARTAN POTASSIUM 50 MG PO TABS: 50.0000 mg | ORAL_TABLET | Freq: Every day | ORAL | Status: DC

## 2015-03-17 NOTE — Telephone Encounter (Signed)
Med filled, pt needs diabetic follow up.

## 2015-05-20 ENCOUNTER — Telehealth: Payer: Self-pay | Admitting: Family

## 2015-05-20 ENCOUNTER — Other Ambulatory Visit: Payer: Self-pay | Admitting: Physician Assistant

## 2015-05-20 ENCOUNTER — Other Ambulatory Visit: Payer: Self-pay | Admitting: Family Medicine

## 2015-05-20 DIAGNOSIS — J111 Influenza due to unidentified influenza virus with other respiratory manifestations: Secondary | ICD-10-CM

## 2015-05-20 MED ORDER — OSELTAMIVIR PHOSPHATE 75 MG PO CAPS: 75.0000 mg | ORAL_CAPSULE | Freq: Two times a day (BID) | ORAL | Status: DC

## 2015-05-20 NOTE — Progress Notes (Signed)
E visit for Flu like symptoms   We are sorry that you are not feeling well.  Here is how we plan to help! Based on what you have shared with me it looks like you may have a respiratory virus that may be influenza.  Influenza or "the flu" is   an infection caused by a respiratory virus. The flu virus is highly contagious and persons who did not receive their yearly flu vaccination may "catch" the flu from close contact.  We have anti-viral medications to treat the viruses that cause this infection. They are not a "cure" and only shorten the course of the infection. These prescriptions are most effective when they are given within the first 2 days of "flu" symptoms. Antiviral medication are indicated if you have a high risk of complications from the flu. You should  also consider an antiviral medication if you are in close contact with someone who is at risk. These medications can help patients avoid complications from the flu  but have side effects that you should know. Possible side effects from Tamiflu or oseltamivir include nausea, vomiting, diarrhea, dizziness, headaches, eye redness, sleep problems or other respiratory symptoms. You should not take Tamiflu if you have an allergy to oseltamivir or any to the ingredients in Tamiflu.  Based upon your symptoms and potential risk factors I have prescribed Oseltamivir (Tamiflu).  It has been sent to your designated pharmacy.  You will take one 75 mg capsule orally twice a day for the next 5 days.  ANYONE WHO HAS FLU SYMPTOMS SHOULD: . Stay home. The flu is highly contagious and going out or to work exposes others! . Be sure to drink plenty of fluids. Water is fine as well as fruit juices, sodas and electrolyte beverages. You may want to stay away from caffeine or alcohol. If you are nauseated, try taking small sips of liquids. How do you know if you are getting enough fluid? Your urine should be a pale yellow or almost colorless. . Get rest. . Taking a  steamy shower or using a humidifier may help nasal congestion and ease sore throat pain. Using a saline nasal spray works much the same way. . Cough drops, hard candies and sore throat lozenges may ease your cough. . Line up a caregiver. Have someone check on you regularly.   GET HELP RIGHT AWAY IF: . You cannot keep down liquids or your medications. . You become short of breath . Your fell like you are going to pass out or loose consciousness. . Your symptoms persist after you have completed your treatment plan MAKE SURE YOU   Understand these instructions.  Will watch your condition.  Will get help right away if you are not doing well or get worse.  Your e-visit answers were reviewed by a board certified advanced clinical practitioner to complete your personal care plan.  Depending on the condition, your plan could have included both over the counter or prescription medications.  If there is a problem please reply  once you have received a response from your provider.  Your safety is important to us.  If you have drug allergies check your prescription carefully.    You can use MyChart to ask questions about today's visit, request a non-urgent call back, or ask for a work or school excuse.  You will get an e-mail in the next two days asking about your experience.  I hope that your e-visit has been valuable and will speed your recovery.   Thank you for using e-visits.   

## 2015-05-23 MED ORDER — ALBUTEROL SULFATE HFA 108 (90 BASE) MCG/ACT IN AERS: 2.0000 | INHALATION_SPRAY | Freq: Four times a day (QID) | RESPIRATORY_TRACT | Status: DC | PRN

## 2015-05-23 MED ORDER — LOSARTAN POTASSIUM 50 MG PO TABS: 50.0000 mg | ORAL_TABLET | Freq: Every day | ORAL | Status: DC

## 2015-05-23 NOTE — Telephone Encounter (Signed)
Med filled.  

## 2015-06-01 ENCOUNTER — Encounter: Payer: Self-pay | Admitting: Internal Medicine

## 2015-06-06 ENCOUNTER — Other Ambulatory Visit: Payer: Self-pay

## 2015-10-03 ENCOUNTER — Other Ambulatory Visit: Payer: Self-pay | Admitting: Family Medicine

## 2015-10-03 MED ORDER — LOSARTAN POTASSIUM 50 MG PO TABS: 50.0000 mg | ORAL_TABLET | Freq: Every day | ORAL | Status: DC

## 2015-10-03 NOTE — Telephone Encounter (Signed)
Med filled #30, cannot fill for a 90 day supply. Pt needs a Diabetes and BP follow up with PCP.

## 2015-11-10 ENCOUNTER — Telehealth: Payer: Self-pay | Admitting: Certified Registered Nurse Anesthetist

## 2015-11-10 NOTE — Telephone Encounter (Signed)
Please schedule for physical.  Multiple care gap opportunities.  Last CPE-11-15-14

## 2015-11-10 NOTE — Telephone Encounter (Signed)
Kristie please contact patient for a Physical with Tabori asap

## 2015-11-11 NOTE — Telephone Encounter (Signed)
Pt does not have insurance currently so she decline appt at this time.

## 2015-11-15 NOTE — Telephone Encounter (Signed)
See below

## 2016-01-13 ENCOUNTER — Other Ambulatory Visit: Payer: Self-pay | Admitting: Physician Assistant

## 2016-01-13 NOTE — Telephone Encounter (Signed)
Please advise pt has not seen you since 11/15/14 (last CPE) per last phone note pt was informed to schedule a CPE but advised she does not have insurance.

## 2016-01-13 NOTE — Telephone Encounter (Signed)
Will defer further refills of patient's medications to PCP  

## 2016-03-12 ENCOUNTER — Other Ambulatory Visit: Payer: Self-pay | Admitting: Internal Medicine

## 2016-05-28 ENCOUNTER — Other Ambulatory Visit: Payer: Self-pay | Admitting: Physician Assistant

## 2016-06-05 ENCOUNTER — Telehealth: Payer: Self-pay | Admitting: Nurse Practitioner

## 2016-06-05 ENCOUNTER — Other Ambulatory Visit: Payer: Self-pay | Admitting: Family Medicine

## 2016-06-05 DIAGNOSIS — J4520 Mild intermittent asthma, uncomplicated: Secondary | ICD-10-CM

## 2016-06-05 DIAGNOSIS — J302 Other seasonal allergic rhinitis: Secondary | ICD-10-CM

## 2016-06-05 MED ORDER — ALBUTEROL SULFATE HFA 108 (90 BASE) MCG/ACT IN AERS: INHALATION_SPRAY | RESPIRATORY_TRACT | Status: DC

## 2016-06-05 NOTE — Progress Notes (Signed)
E visit for Allergic Rhinitis We are sorry that you are not feeling well.  Her is how we plan to help!  Based on what you have shared with me it looks like you have Allergic Rhinitis.  Rhinitis is when a reaction occurs that causes nasal congestion, runny nose, sneezing, and itching.  Most types of rhinitis are caused by an inflammation and are associated with symptoms in the eyes ears or throat. There are several types of rhinitis.  The most common are acute rhinitis, which is usually caused by a viral illness, allergic or seasonal rhinitis, and nonallergic or year-round rhinitis.  Nasal allergies occur certain times of the year.  Allergic rhinitis is caused when allergens in the air trigger the release of histamine in the body.  Histamine causes itching, swelling, and fluid to build up in the fragile linings of the nasal passages, sinuses and eyelids.  An itchy nose and clear discharge are common.  I recommend the following over the counter treatments: You should take a daily dose of antihistamine    You may also benefit from eye drops such as: Systane 1-2 driops each eye twice daily as needed OTC   i also sent in rescue inhaler as requested HOME CARE:   You can use an over-the-counter saline nasal spray as needed  Avoid areas where there is heavy dust, mites, or molds  Stay indoors on windy days during the pollen season  Keep windows closed in home, at least in bedroom; use air conditioner.  Use high-efficiency house air filter  Keep windows closed in car, turn AC on re-circulate  Avoid playing out with dog during pollen season  GET HELP RIGHT AWAY IF:   If your symptoms do not improve within 10 days  You become short of breath  You develop yellow or green discharge from your nose for over 3 days  You have coughing fits  MAKE SURE YOU:   Understand these instructions  Will watch your condition  Will get help right away if you are not doing well or get  worse  Thank you for choosing an e-visit. Your e-visit answers were reviewed by a board certified advanced clinical practitioner to complete your personal care plan. Depending upon the condition, your plan could have included both over the counter or prescription medications. Please review your pharmacy choice. Be sure that the pharmacy you have chosen is open so that you can pick up your prescription now.  If there is a problem you may message your provider in [place] to have the prescription routed to another pharmacy. Your safety is important to [name]. If you have drug allergies check your prescription carefully.  For the next 24 hours, you can use MyChart to ask questions about today's visit, request a non-urgent call back, or ask for a work or school excuse from your e-visit provider. You will get an email in the next two days asking about your experience. I hope that your e-visit has been valuable and will speed your recovery.

## 2016-06-06 NOTE — Telephone Encounter (Signed)
Med denied, pt needs an appt has not been seen since 2015.

## 2016-06-22 ENCOUNTER — Telehealth: Payer: Self-pay | Admitting: Nurse Practitioner

## 2016-06-22 DIAGNOSIS — J0101 Acute recurrent maxillary sinusitis: Secondary | ICD-10-CM

## 2016-06-22 MED ORDER — AMOXICILLIN-POT CLAVULANATE 875-125 MG PO TABS: 1.0000 | ORAL_TABLET | Freq: Two times a day (BID) | ORAL | Status: DC

## 2016-06-22 NOTE — Progress Notes (Signed)

## 2016-09-28 DIAGNOSIS — Z8669 Personal history of other diseases of the nervous system and sense organs: Secondary | ICD-10-CM | POA: Insufficient documentation

## 2016-09-28 DIAGNOSIS — K219 Gastro-esophageal reflux disease without esophagitis: Secondary | ICD-10-CM | POA: Insufficient documentation

## 2016-10-18 ENCOUNTER — Other Ambulatory Visit: Payer: Self-pay | Admitting: *Deleted

## 2016-10-18 DIAGNOSIS — Z1231 Encounter for screening mammogram for malignant neoplasm of breast: Secondary | ICD-10-CM

## 2017-08-14 DIAGNOSIS — R42 Dizziness and giddiness: Secondary | ICD-10-CM | POA: Insufficient documentation

## 2017-12-07 ENCOUNTER — Other Ambulatory Visit: Payer: Self-pay | Admitting: Family Medicine

## 2017-12-07 DIAGNOSIS — Z1231 Encounter for screening mammogram for malignant neoplasm of breast: Secondary | ICD-10-CM

## 2017-12-25 ENCOUNTER — Other Ambulatory Visit: Payer: Self-pay | Admitting: Physician Assistant

## 2017-12-25 DIAGNOSIS — Z1231 Encounter for screening mammogram for malignant neoplasm of breast: Secondary | ICD-10-CM

## 2018-01-14 ENCOUNTER — Ambulatory Visit
Admission: RE | Admit: 2018-01-14 | Discharge: 2018-01-14 | Disposition: A | Discharge status: Home / Self Care | Payer: 59 | Source: Ambulatory Visit | Attending: Physician Assistant | Admitting: Physician Assistant

## 2018-01-14 DIAGNOSIS — Z1231 Encounter for screening mammogram for malignant neoplasm of breast: Secondary | ICD-10-CM

## 2020-02-15 ENCOUNTER — Other Ambulatory Visit: Payer: Self-pay | Admitting: Physician Assistant

## 2020-02-15 DIAGNOSIS — Z1231 Encounter for screening mammogram for malignant neoplasm of breast: Secondary | ICD-10-CM

## 2020-02-17 ENCOUNTER — Other Ambulatory Visit: Payer: Self-pay

## 2020-02-17 ENCOUNTER — Ambulatory Visit
Admission: RE | Admit: 2020-02-17 | Discharge: 2020-02-17 | Disposition: A | Discharge status: Home / Self Care | Payer: Managed Care, Other (non HMO) | Source: Ambulatory Visit | Attending: Physician Assistant | Admitting: Physician Assistant

## 2020-02-17 DIAGNOSIS — Z1231 Encounter for screening mammogram for malignant neoplasm of breast: Secondary | ICD-10-CM

## 2020-02-18 ENCOUNTER — Ambulatory Visit: Payer: Managed Care, Other (non HMO) | Attending: Internal Medicine

## 2020-02-18 DIAGNOSIS — Z23 Encounter for immunization: Secondary | ICD-10-CM

## 2020-02-18 NOTE — Progress Notes (Signed)
   Covid-19 Vaccination Clinic  Name:  Morgan Andrews    MRN: [id number] DOB: [birth year]  02/18/2020  [name] was observed post Covid-19 immunization for 15 minutes without incident. She was provided with Vaccine Information Sheet and instruction to access the V-Safe system.   [name] was instructed to call 911 with any severe reactions post vaccine: Marland Kitchen Difficulty breathing  . Swelling of face and throat  . A fast heartbeat  . A bad rash all over body  . Dizziness and weakness   Immunizations Administered    Name Date Dose VIS Date Route   Pfizer COVID-19 Vaccine 02/18/2020  3:46 PM 0.3 mL 11/20/2019 Intramuscular   Manufacturer: [place]   Lot: [id number]   [place]: [id number]

## 2020-02-19 ENCOUNTER — Ambulatory Visit: Payer: 59

## 2020-03-14 ENCOUNTER — Ambulatory Visit: Payer: Managed Care, Other (non HMO) | Attending: Internal Medicine

## 2020-03-14 DIAGNOSIS — Z23 Encounter for immunization: Secondary | ICD-10-CM

## 2020-03-14 NOTE — Progress Notes (Signed)
   Covid-19 Vaccination Clinic  Name:  Morgan Andrews    MRN: [id number] DOB: February 15, 1961  03/14/2020  [name] was observed post Covid-19 immunization for 15 minutes without incident. She was provided with Vaccine Information Sheet and instruction to access the V-Safe system.   [name] was instructed to call 911 with any severe reactions post vaccine: Marland Kitchen Difficulty breathing  . Swelling of face and throat  . A fast heartbeat  . A bad rash all over body  . Dizziness and weakness   Immunizations Administered    Name Date Dose VIS Date Route   Pfizer COVID-19 Vaccine 03/14/2020 10:54 AM 0.3 mL 11/20/2019 Intramuscular   Manufacturer: [place]   Lot: [id number]   [place]: [id number]

## 2020-08-23 ENCOUNTER — Telehealth: Payer: Self-pay | Admitting: Physician Assistant

## 2020-08-23 NOTE — Telephone Encounter (Signed)
yes

## 2020-08-23 NOTE — Telephone Encounter (Signed)
    Have you agreed to see Morgan Andrews as a new patient?

## 2020-08-29 NOTE — Telephone Encounter (Signed)
Scheduler left message for patient to call for appointment with Dr Quay Burow

## 2020-10-05 ENCOUNTER — Encounter: Payer: Self-pay | Admitting: Internal Medicine

## 2020-10-05 DIAGNOSIS — I10 Essential (primary) hypertension: Secondary | ICD-10-CM

## 2020-10-05 DIAGNOSIS — E782 Mixed hyperlipidemia: Secondary | ICD-10-CM

## 2020-10-05 DIAGNOSIS — E119 Type 2 diabetes mellitus without complications: Secondary | ICD-10-CM

## 2020-10-05 DIAGNOSIS — D649 Anemia, unspecified: Secondary | ICD-10-CM

## 2020-10-07 ENCOUNTER — Other Ambulatory Visit (INDEPENDENT_AMBULATORY_CARE_PROVIDER_SITE_OTHER): Payer: Managed Care, Other (non HMO)

## 2020-10-07 DIAGNOSIS — E119 Type 2 diabetes mellitus without complications: Secondary | ICD-10-CM

## 2020-10-07 DIAGNOSIS — I10 Essential (primary) hypertension: Secondary | ICD-10-CM | POA: Diagnosis not present

## 2020-10-07 DIAGNOSIS — E782 Mixed hyperlipidemia: Secondary | ICD-10-CM

## 2020-10-07 DIAGNOSIS — D649 Anemia, unspecified: Secondary | ICD-10-CM | POA: Diagnosis not present

## 2020-10-07 LAB — COMPREHENSIVE METABOLIC PANEL
ALT: 24 U/L (ref 0–35)
AST: 14 U/L (ref 0–37)
Albumin: 4.2 g/dL (ref 3.5–5.2)
Alkaline Phosphatase: 52 U/L (ref 39–117)
BUN: 18 mg/dL (ref 6–23)
CO2: 31 mEq/L (ref 19–32)
Calcium: 9.5 mg/dL (ref 8.4–10.5)
Chloride: 101 mEq/L (ref 96–112)
Creatinine, Ser: 0.9 mg/dL (ref 0.40–1.20)
GFR: 70.27 mL/min (ref >60.00)
Glucose, Bld: 94 mg/dL (ref 70–99)
Potassium: 3.9 mEq/L (ref 3.5–5.1)
Sodium: 138 mEq/L (ref 135–145)
Total Bilirubin: 0.3 mg/dL (ref 0.2–1.2)
Total Protein: 7.2 g/dL (ref 6.0–8.3)

## 2020-10-07 LAB — CBC WITH DIFFERENTIAL/PLATELET
Basophils Absolute: 0 10*3/uL (ref 0.0–0.1)
Basophils Relative: 0.2 % (ref 0.0–3.0)
Eosinophils Absolute: 0.1 10*3/uL (ref 0.0–0.7)
Eosinophils Relative: 1.1 % (ref 0.0–5.0)
HCT: 40.3 % (ref 36.0–46.0)
Hemoglobin: 12.8 g/dL (ref 12.0–15.0)
Lymphocytes Relative: 22.7 % (ref 12.0–46.0)
Lymphs Abs: 2.5 10*3/uL (ref 0.7–4.0)
MCHC: 31.9 g/dL (ref 30.0–36.0)
MCV: 85.3 fl (ref 78.0–100.0)
Monocytes Absolute: 1.1 10*3/uL — ABNORMAL HIGH (ref 0.1–1.0)
Monocytes Relative: 9.8 % (ref 3.0–12.0)
Neutro Abs: 7.4 10*3/uL (ref 1.4–7.7)
Neutrophils Relative %: 66.2 % (ref 43.0–77.0)
Platelets: 260 10*3/uL (ref 150.0–400.0)
RBC: 4.72 Mil/uL (ref 3.87–5.11)
RDW: 17.1 % — ABNORMAL HIGH (ref 11.5–15.5)
WBC: 11.2 10*3/uL — ABNORMAL HIGH (ref 4.0–10.5)

## 2020-10-07 LAB — TSH: TSH: 2.66 u[IU]/mL (ref 0.35–4.50)

## 2020-10-07 LAB — LIPID PANEL
Cholesterol: 179 mg/dL (ref 0–200)
HDL: 84.6 mg/dL (ref >39.00)
LDL Cholesterol: 69 mg/dL (ref 0–99)
NonHDL: 94.41
Total CHOL/HDL Ratio: 2
Triglycerides: 128 mg/dL (ref 0.0–149.0)
VLDL: 25.6 mg/dL (ref 0.0–40.0)

## 2020-10-07 LAB — HEMOGLOBIN A1C: Hgb A1c MFr Bld: 6.8 % — ABNORMAL HIGH (ref 4.6–6.5)

## 2020-10-09 NOTE — Progress Notes (Signed)
Subjective:    Patient ID: Morgan Andrews, female    DOB: 1961-06-01, 59 y.o.   MRN: [id number]  HPI  She is here to establish with a new pcp.  The patient is here for follow up of their chronic medical problems, including DM, htn, sleep difficulties, asthma, anxiety   She does exercise-walking.  She does struggle to lose weight.  Medications and allergies reviewed with patient and updated if appropriate.  Patient Active Problem List   Diagnosis Date Noted  . Obesity (BMI 30-39.9) 09/07/2014  . Bell's palsy 11/06/2013  . 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 Diabetes ([place]) 09/05/2013  . Cervical disc disease 07/26/2013  . Chest pain 07/10/2012  . Anxiety disorder 04/18/2012  . HTN (hypertension) 04/18/2012  . NONSPECIFIC ABNORMAL ELECTROCARDIOGRAM 04/04/2010  . ALLERGIC RHINITIS 01/25/2009  . Asthma 01/25/2009  . ECZEMA 01/25/2009  . 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 01/25/2009    Current Outpatient Medications on File Prior to Visit  Medication Sig Dispense Refill  . albuterol (VENTOLIN HFA) 108 (90 Base) MCG/ACT inhaler INHALE 2 PUFFS BY MOUTH EVERY 6 HOURS AS NEEDED FOR WHEEZING OR SHORTNESSOF BREATH 18 Inhaler 0  . amitriptyline (ELAVIL) 25 MG tablet Pt takes 1-3 tablets by mouth at bedtime    . Fluticasone Propionate, Inhal, (FLOVENT DISKUS) 250 MCG/BLIST AEPB Inhale into the lungs.    Marland Kitchen ipratropium-albuterol (DUONEB) 0.5-2.5 (3) MG/3ML SOLN Take 3 mLs by nebulization as needed.    Marland Kitchen lisinopril (ZESTRIL) 10 MG tablet Take 10 mg by mouth daily.    . montelukast (SINGULAIR) 10 MG tablet Take 10 mg by mouth daily.    Marland Kitchen omeprazole (PRILOSEC) 20 MG capsule TAKE 1 CAPSULE BY MOUTH DAILY 240 capsule 1  . pravastatin (PRAVACHOL) 10 MG tablet Take 10 mg by mouth daily.    . traZODone (DESYREL) 150 MG tablet Take 150 mg by mouth at bedtime.     No current facility-administered medications on file prior to visit.    Past Medical History:  Diagnosis Date  .  Anemia   . Anxiety    classic Panic attacks with sweating & chest pain  . Asthma   . Basal cell carcinoma   . Cervical radiculopathy    C5-6  . Diabetes mellitus without complication ([place])   . Eczema   . Esophageal stricture   . GERD (gastroesophageal reflux disease)   . Hiatal hernia   . Hypertension   . Seasonal allergies     Past Surgical History:  Procedure Laterality Date  . ANKLE SURGERY Right 1999-2002   X 5, Dr Percell Miller  . CERVICAL FUSION  2008, 10/05/13   x 3 fusion, discectomy & plating , Dr Arnoldo Morale  . no colonoscopy    . PARTIAL HYSTERECTOMY    . SHOULDER ARTHROSCOPY WITH SUBACROMIAL DECOMPRESSION, ROTATOR CUFF REPAIR AND BICEP TENDON REPAIR Left 07/01/2014   Procedure: LEFT SHOULDER ARTHROSCOPY  DEBRIDEMENT EXTENTSIVE,DISTAL CLAVICULECTOMY,DECOMPRESSION SUBACROMIAL PARTIAL ACROMIOPLASTY WITH CORACOACROMIAL RELEASE,ARTHROSCOPY SHOULDER WITH ROTATOR CUFF REPAIR.;  Surgeon: Ninetta Lights, MD;  Location: [facility];  Service: Orthopedics;  Laterality: Left;  . TONSILLECTOMY AND ADENOIDECTOMY    . UPPER GASTROINTESTINAL ENDOSCOPY  2007   negative  . WRIST SURGERY Left     Social History   Socioeconomic History  . Marital status: Married    Spouse name: Not on file  . Number of children: 3  . Years of education: Not on file  . Highest education level: Not on file  Occupational  History  . Occupation: disalbled  Tobacco Use  . Smoking status: Never Smoker  . Smokeless tobacco: Never Used  Substance and Sexual Activity  . Alcohol use: Yes    Comment: OCC-WINE  . Drug use: No  . Sexual activity: Not on file  Other Topics Concern  . Not on file  Social History Narrative  . Not on file   Social Determinants of Health   Financial Resource Strain:   . Difficulty of Paying Living Expenses: Not on file  Food Insecurity:   . Worried About Charity fundraiser in the Last Year: Not on file  . Ran Out of Food in the Last Year: Not on file    Transportation Needs:   . Lack of Transportation (Medical): Not on file  . Lack of Transportation (Non-Medical): Not on file  Physical Activity:   . Days of Exercise per Week: Not on file  . Minutes of Exercise per Session: Not on file  Stress:   . Feeling of Stress : Not on file  Social Connections:   . Frequency of Communication with Friends and Family: Not on file  . Frequency of Social Gatherings with Friends and Family: Not on file  . Attends Religious Services: Not on file  . Active Member of Clubs or Organizations: Not on file  . Attends Archivist Meetings: Not on file  . Marital Status: Not on file    Family History  Adopted: Yes  Problem Relation Age of Onset  . Asthma Mother   . Hypertension Mother   . Anxiety disorder Mother        with panic attacks & depression  . Depression Mother   . Hypertension Maternal Aunt   . Stroke Maternal Aunt        >65  . Rheum arthritis Maternal Aunt   . Anxiety disorder Sister        as above  . Breast cancer Sister        half sisiter, unsure whisch side of family  . Diabetes Neg Hx   . Colon cancer Neg Hx     Review of Systems  Constitutional: Negative for chills and fever.  HENT: Positive for congestion, postnasal drip and sneezing.   Eyes: Negative for visual disturbance.  Respiratory: Negative for cough, shortness of breath and wheezing.   Cardiovascular: Negative for chest pain, palpitations and leg swelling.  Gastrointestinal: Negative for abdominal pain, blood in stool, constipation, diarrhea and nausea.  Genitourinary: Negative for dysuria and hematuria.  Musculoskeletal: Negative for arthralgias.  Neurological: Negative for dizziness, light-headedness and headaches.  Psychiatric/Behavioral: Positive for sleep disturbance. Negative for dysphoric mood. The patient is not nervous/anxious.        Objective:   Vitals:   10/10/20 1100  BP: 128/78  Pulse: 83  Temp: 98.2 F (36.8 C)  SpO2: 97%   BP  Readings from Last 3 Encounters:  10/10/20 128/78  12/01/14 144/72  11/30/14 125/76   Wt Readings from Last 3 Encounters:  10/10/20 195 lb (88.5 kg)  12/01/14 200 lb (90.7 kg)  11/30/14 205 lb 8 oz (93.2 kg)   Body mass index is 32.45 kg/m.   Physical Exam    Constitutional: Appears well-developed and well-nourished. No distress.  HENT:  Head: Normocephalic and atraumatic.  Neck: Neck supple. No tracheal deviation present. No thyromegaly present.  No cervical lymphadenopathy Cardiovascular: Normal rate, regular rhythm and normal heart sounds.  No murmur heard. No carotid bruit .  No edema  Pulmonary/Chest: Effort normal and breath sounds normal. No respiratory distress. No has no wheezes. No rales. Abdomen: Soft, nontender, nondistended Skin: Skin is warm and dry. Not diaphoretic.  Psychiatric: Normal mood and affect. Behavior is normal.   Lab Results  Component Value Date   WBC 11.2 (H) 10/07/2020   HGB 12.8 10/07/2020   HCT 40.3 10/07/2020   PLT 260.0 10/07/2020   GLUCOSE 94 10/07/2020   CHOL 179 10/07/2020   TRIG 128.0 10/07/2020   HDL 84.60 10/07/2020   LDLDIRECT 128.6 09/02/2013   LDLCALC 69 10/07/2020   ALT 24 10/07/2020   AST 14 10/07/2020   NA 138 10/07/2020   K 3.9 10/07/2020   CL 101 10/07/2020   CREATININE 0.90 10/07/2020   BUN 18 10/07/2020   CO2 31 10/07/2020   TSH 2.66 10/07/2020   HGBA1C 6.8 (H) 10/07/2020      Assessment & Plan:    See Problem List for Assessment and Plan of chronic medical problems.    This visit occurred during the SARS-CoV-2 public health emergency.  Safety protocols were in place, including screening questions prior to the visit, additional usage of staff PPE, and extensive cleaning of exam room while observing appropriate contact time as indicated for disinfecting solutions.

## 2020-10-09 NOTE — Patient Instructions (Addendum)
  Flu and shingles immunizations administered today.     Medications reviewed and updated.  Changes include :   Increase ozempic to 0.5 mg for one month and then increase to 1 mg weekly   Your prescription(s) have been submitted to your pharmacy. Please take as directed and contact our office if you believe you are having problem(s) with the medication(s).    Please followup in 6 months

## 2020-10-10 ENCOUNTER — Encounter: Payer: Self-pay | Admitting: Internal Medicine

## 2020-10-10 ENCOUNTER — Other Ambulatory Visit: Payer: Self-pay

## 2020-10-10 ENCOUNTER — Ambulatory Visit (INDEPENDENT_AMBULATORY_CARE_PROVIDER_SITE_OTHER): Payer: Managed Care, Other (non HMO) | Admitting: Internal Medicine

## 2020-10-10 VITALS — BP 128/78 | HR 83 | Temp 98.2°F | Ht 65.0 in | Wt 195.0 lb

## 2020-10-10 DIAGNOSIS — Z23 Encounter for immunization: Secondary | ICD-10-CM

## 2020-10-10 DIAGNOSIS — G479 Sleep disorder, unspecified: Secondary | ICD-10-CM

## 2020-10-10 DIAGNOSIS — I10 Essential (primary) hypertension: Secondary | ICD-10-CM

## 2020-10-10 DIAGNOSIS — E119 Type 2 diabetes mellitus without complications: Secondary | ICD-10-CM

## 2020-10-10 DIAGNOSIS — E669 Obesity, unspecified: Secondary | ICD-10-CM

## 2020-10-10 DIAGNOSIS — F411 Generalized anxiety disorder: Secondary | ICD-10-CM | POA: Diagnosis not present

## 2020-10-10 DIAGNOSIS — J4520 Mild intermittent asthma, uncomplicated: Secondary | ICD-10-CM

## 2020-10-10 MED ORDER — OZEMPIC (0.25 OR 0.5 MG/DOSE) 2 MG/1.5ML ~~LOC~~ SOPN: 0.5000 mg | PEN_INJECTOR | SUBCUTANEOUS | 0 refills | Status: DC

## 2020-10-10 MED ORDER — ESTRADIOL 0.1 MG/GM VA CREA: 1.0000 | TOPICAL_CREAM | VAGINAL | 12 refills | Status: DC

## 2020-10-10 MED ORDER — OZEMPIC (1 MG/DOSE) 2 MG/1.5ML ~~LOC~~ SOPN: 1.0000 mg | PEN_INJECTOR | SUBCUTANEOUS | 5 refills | Status: DC

## 2020-10-10 NOTE — Assessment & Plan Note (Signed)
Chronic Would like to lose weight, but is struggling to do so Continue regular walking Continue diabetic diet, decrease portions Will increase Ozempic to help with sugars, but hopefully will help with weight as well Follow-up in 6 months

## 2020-10-10 NOTE — Assessment & Plan Note (Addendum)
Chronic Mild persistent flovent BID, albuterol prn Letter Singulair daily Overall controlled with above-continue

## 2020-10-10 NOTE — Assessment & Plan Note (Signed)
Chronic Mostly with crowds or some social anxiety Not currently on any medication, but does take amitriptyline at night which may help Continue amitriptyline 25-75 mg nightly

## 2020-10-10 NOTE — Assessment & Plan Note (Signed)
Chronic Well controlled Continue trazodone 150 mg nightly and amitriptyline 25-75 mg nightly

## 2020-10-10 NOTE — Assessment & Plan Note (Signed)
Chronic BP well controlled Continue lisinopril 10 mg daily cmp  

## 2020-10-10 NOTE — Assessment & Plan Note (Signed)
Chronic A1c 6.8%-higher than previous, but still controlled Currently taking Ozempic 0.25 mg weekly-we will increase to 0.5 mg weekly for 1 month and then 1 mg weekly to help with sugars and hopefully weight loss Continue regular walking, diabetic diet

## 2020-10-11 MED ORDER — AMITRIPTYLINE HCL 25 MG PO TABS
25.0000 mg | ORAL_TABLET | Freq: Every day | ORAL | 1 refills | Status: DC
Start: 2020-10-11 — End: 2020-12-20

## 2020-10-11 MED ORDER — PRAVASTATIN SODIUM 10 MG PO TABS
10.0000 mg | ORAL_TABLET | Freq: Every day | ORAL | 3 refills | Status: DC
Start: 2020-10-11 — End: 2020-12-20

## 2020-10-11 MED ORDER — TRAZODONE HCL 150 MG PO TABS
150.0000 mg | ORAL_TABLET | Freq: Every day | ORAL | 3 refills | Status: DC
Start: 2020-10-11 — End: 2020-12-20

## 2020-10-11 NOTE — Addendum Note (Signed)
Addended by: Marcina Millard on: 10/11/2020 08:21 AM   Modules accepted: Orders

## 2020-10-27 ENCOUNTER — Encounter: Payer: Self-pay | Admitting: Internal Medicine

## 2020-10-28 MED ORDER — FLOVENT DISKUS 250 MCG/BLIST IN AEPB: 1.0000 | INHALATION_SPRAY | Freq: Two times a day (BID) | RESPIRATORY_TRACT | 3 refills | Status: DC

## 2020-11-09 ENCOUNTER — Encounter: Payer: Self-pay | Admitting: Internal Medicine

## 2020-11-11 MED ORDER — OMEPRAZOLE 40 MG PO CPDR: 40.0000 mg | DELAYED_RELEASE_CAPSULE | Freq: Every day | ORAL | 1 refills | Status: DC

## 2020-12-19 ENCOUNTER — Encounter: Payer: Self-pay | Admitting: Internal Medicine

## 2020-12-20 MED ORDER — OZEMPIC (1 MG/DOSE) 2 MG/1.5ML ~~LOC~~ SOPN: 1.0000 mg | PEN_INJECTOR | SUBCUTANEOUS | 5 refills | Status: DC

## 2020-12-20 MED ORDER — ESTRADIOL 0.1 MG/GM VA CREA
1.0000 | TOPICAL_CREAM | VAGINAL | 12 refills | Status: DC
Start: 2020-12-21 — End: 2022-02-06

## 2020-12-20 MED ORDER — PRAVASTATIN SODIUM 10 MG PO TABS
10.0000 mg | ORAL_TABLET | Freq: Every day | ORAL | 3 refills | Status: DC
Start: 2020-12-20 — End: 2022-01-17

## 2020-12-20 MED ORDER — LISINOPRIL 10 MG PO TABS: 10.0000 mg | ORAL_TABLET | Freq: Every day | ORAL | 2 refills | Status: DC

## 2020-12-20 MED ORDER — FLOVENT DISKUS 250 MCG/BLIST IN AEPB: 1.0000 | INHALATION_SPRAY | Freq: Two times a day (BID) | RESPIRATORY_TRACT | 3 refills | Status: DC

## 2020-12-20 MED ORDER — MONTELUKAST SODIUM 10 MG PO TABS: 10.0000 mg | ORAL_TABLET | Freq: Every day | ORAL | 3 refills | Status: DC

## 2020-12-20 MED ORDER — OMEPRAZOLE 40 MG PO CPDR
40.0000 mg | DELAYED_RELEASE_CAPSULE | Freq: Every day | ORAL | 1 refills | Status: DC
Start: 2020-12-20 — End: 2021-09-18

## 2020-12-20 MED ORDER — AMITRIPTYLINE HCL 25 MG PO TABS
25.0000 mg | ORAL_TABLET | Freq: Every day | ORAL | 1 refills | Status: DC
Start: 2020-12-20 — End: 2021-07-12

## 2020-12-20 MED ORDER — TRAZODONE HCL 150 MG PO TABS
150.0000 mg | ORAL_TABLET | Freq: Every day | ORAL | 3 refills | Status: DC
Start: 2020-12-20 — End: 2022-01-25

## 2021-01-04 ENCOUNTER — Ambulatory Visit (INDEPENDENT_AMBULATORY_CARE_PROVIDER_SITE_OTHER): Payer: Managed Care, Other (non HMO)

## 2021-01-04 ENCOUNTER — Other Ambulatory Visit: Payer: Self-pay

## 2021-01-04 DIAGNOSIS — Z23 Encounter for immunization: Secondary | ICD-10-CM | POA: Diagnosis not present

## 2021-03-28 ENCOUNTER — Encounter: Payer: Self-pay | Admitting: Internal Medicine

## 2021-03-29 ENCOUNTER — Telehealth: Payer: Self-pay

## 2021-03-29 NOTE — Telephone Encounter (Signed)
Morgan Andrews (Key: [id number])  This request has received a Favorable outcome.  Please note any additional information provided by [facility] at the bottom of this request.  Message from Plan PA Case: [id number], Status: Approved, Coverage Starts on: 03/29/2021 12:00:00 AM, Coverage Ends on: 03/29/2022 12:00:00 AM.

## 2021-03-29 NOTE — Telephone Encounter (Signed)
(  Key: [id number])  Auth started today on cover my meds.

## 2021-03-30 MED ORDER — OZEMPIC (1 MG/DOSE) 2 MG/1.5ML ~~LOC~~ SOPN: 1.0000 mg | PEN_INJECTOR | SUBCUTANEOUS | 1 refills | Status: DC

## 2021-04-12 NOTE — Progress Notes (Signed)
Subjective:    Patient ID: Morgan Andrews, female    DOB: 04-17-1961, 60 y.o.   MRN: [id number]   This visit occurred during the SARS-CoV-2 public health emergency.  Safety protocols were in place, including screening questions prior to the visit, additional usage of staff PPE, and extensive cleaning of exam room while observing appropriate contact time as indicated for disinfecting solutions.    HPI She is here for a physical exam.   She has no concerns and denies any updates since her last visit.  Overall she is feels she is doing well.  Medications and allergies reviewed with patient and updated if appropriate.  Patient Active Problem List   Diagnosis Date Noted  . Gastroesophageal reflux disease 09/28/2016  . Obesity (BMI 30-39.9) 09/07/2014  . Bell's palsy 11/06/2013  . 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 Diabetes ([place]) 09/05/2013  . Hyperlipidemia 09/02/2013  . Cervical disc disease 07/26/2013  . Chest pain 07/10/2012  . Anxiety disorder 04/18/2012  . HTN (hypertension) 04/18/2012  . NONSPECIFIC ABNORMAL ELECTROCARDIOGRAM 04/04/2010  . ALLERGIC RHINITIS 01/25/2009  . Asthma 01/25/2009  . ECZEMA 01/25/2009  . 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 01/25/2009    Current Outpatient Medications on File Prior to Visit  Medication Sig Dispense Refill  . albuterol (VENTOLIN HFA) 108 (90 Base) MCG/ACT inhaler INHALE 2 PUFFS BY MOUTH EVERY 6 HOURS AS NEEDED FOR WHEEZING OR SHORTNESSOF BREATH 18 Inhaler 0  . amitriptyline (ELAVIL) 25 MG tablet Take 1-3 tablets (25-75 mg total) by mouth at bedtime. 270 tablet 1  . [employer] PAIN RELIEF EXTRA STRENGTH 500 MG tablet Take by mouth.    . estradiol (ESTRACE VAGINAL) 0.1 MG/GM vaginal cream Place 1 Applicatorful vaginally 3 (three) times a week. 42.5 g 12  . Fluticasone Propionate, Inhal, (FLOVENT DISKUS) 250 MCG/BLIST AEPB Inhale 1-2 puffs into the lungs in the morning and at bedtime. 180 each 3  . ipratropium-albuterol  (DUONEB) 0.5-2.5 (3) MG/3ML SOLN Take 3 mLs by nebulization as needed.    . montelukast (SINGULAIR) 10 MG tablet Take 1 tablet (10 mg total) by mouth daily. 90 tablet 3  . omeprazole (PRILOSEC) 40 MG capsule Take 1 capsule (40 mg total) by mouth daily. 90 capsule 1  . pravastatin (PRAVACHOL) 10 MG tablet Take 1 tablet (10 mg total) by mouth daily. 90 tablet 3  . Semaglutide, 1 MG/DOSE, (OZEMPIC, 1 MG/DOSE,) 2 MG/1.5ML SOPN Inject 1 mg into the skin once a week. 9 mL 1  . traZODone (DESYREL) 150 MG tablet Take 1 tablet (150 mg total) by mouth at bedtime. 90 tablet 3  . lisinopril (ZESTRIL) 10 MG tablet Take 1 tablet (10 mg total) by mouth daily. (Patient not taking: Reported on 04/13/2021) 90 tablet 2   No current facility-administered medications on file prior to visit.    Past Medical History:  Diagnosis Date  . Anemia   . Anxiety    classic Panic attacks with sweating & chest pain  . Asthma   . Basal cell carcinoma   . Cervical radiculopathy    C5-6  . Diabetes mellitus without complication ([place])   . Eczema   . Esophageal stricture   . GERD (gastroesophageal reflux disease)   . Hiatal hernia   . Hypertension   . Seasonal allergies     Past Surgical History:  Procedure Laterality Date  . ANKLE SURGERY Right 1999-2002   X 5, Dr Percell Miller  . CERVICAL FUSION  2008, 10/05/13   x 3 fusion, discectomy &  plating , Dr Arnoldo Morale  . no colonoscopy    . PARTIAL HYSTERECTOMY    . SHOULDER ARTHROSCOPY WITH SUBACROMIAL DECOMPRESSION, ROTATOR CUFF REPAIR AND BICEP TENDON REPAIR Left 07/01/2014   Procedure: LEFT SHOULDER ARTHROSCOPY  DEBRIDEMENT EXTENTSIVE,DISTAL CLAVICULECTOMY,DECOMPRESSION SUBACROMIAL PARTIAL ACROMIOPLASTY WITH CORACOACROMIAL RELEASE,ARTHROSCOPY SHOULDER WITH ROTATOR CUFF REPAIR.;  Surgeon: Ninetta Lights, MD;  Location: [facility];  Service: Orthopedics;  Laterality: Left;  . TONSILLECTOMY AND ADENOIDECTOMY    . UPPER GASTROINTESTINAL ENDOSCOPY  2007   negative   . WRIST SURGERY Left     Social History   Socioeconomic History  . Marital status: Married    Spouse name: Not on file  . Number of children: 3  . Years of education: Not on file  . Highest education level: Not on file  Occupational History  . Occupation: disalbled  Tobacco Use  . Smoking status: Never Smoker  . Smokeless tobacco: Never Used  Substance and Sexual Activity  . Alcohol use: Yes    Comment: OCC-WINE  . Drug use: No  . Sexual activity: Not on file  Other Topics Concern  . Not on file  Social History Narrative  . Not on file   Social Determinants of Health   Financial Resource Strain: Not on file  Food Insecurity: Not on file  Transportation Needs: Not on file  Physical Activity: Not on file  Stress: Not on file  Social Connections: Not on file    Family History  Adopted: Yes  Problem Relation Age of Onset  . Asthma Mother   . Hypertension Mother   . Anxiety disorder Mother        with panic attacks & depression  . Depression Mother   . Hypertension Maternal Aunt   . Stroke Maternal Aunt        >65  . Rheum arthritis Maternal Aunt   . Anxiety disorder Sister        as above  . Breast cancer Sister        half sisiter, unsure whisch side of family  . Diabetes Neg Hx   . Colon cancer Neg Hx     Review of Systems  Constitutional: Negative for chills, fatigue and fever.  HENT: Positive for nosebleeds.   Eyes: Negative for visual disturbance.  Respiratory: Negative for cough, shortness of breath and wheezing.   Cardiovascular: Positive for chest pain (with anxiety). Negative for palpitations and leg swelling.  Gastrointestinal: Positive for constipation (mild). Negative for blood in stool, diarrhea and nausea.       Gerd controlled  Genitourinary: Negative for dysuria and hematuria.  Musculoskeletal: Positive for arthralgias (mild, intermittently). Negative for back pain.  Skin: Negative for color change and rash.  Neurological: Negative for  dizziness, light-headedness, numbness and headaches.  Psychiatric/Behavioral: Negative for dysphoric mood. The patient is nervous/anxious.        Objective:   Vitals:   04/13/21 1000  BP: 124/82  Pulse: 88  Temp: 98.1 F (36.7 C)  SpO2: 95%   Filed Weights   04/13/21 1000  Weight: 187 lb (84.8 kg)   Body mass index is 31.12 kg/m.  BP Readings from Last 3 Encounters:  04/13/21 124/82  10/10/20 128/78  12/01/14 144/72    Wt Readings from Last 3 Encounters:  04/13/21 187 lb (84.8 kg)  10/10/20 195 lb (88.5 kg)  12/01/14 200 lb (90.7 kg)     Physical Exam Constitutional: She appears well-developed and well-nourished. No distress.  HENT:  Head: Normocephalic  and atraumatic.  Right Ear: External ear normal. Normal ear canal and TM Left Ear: External ear normal.  Normal ear canal and TM Mouth/Throat: Oropharynx is clear and moist.  Eyes: Conjunctivae and EOM are normal.  Neck: Neck supple. No tracheal deviation present. No thyromegaly present.  No carotid bruit  Cardiovascular: Normal rate, regular rhythm and normal heart sounds.   No murmur heard.  No edema. Pulmonary/Chest: Effort normal and breath sounds normal. No respiratory distress. She has no wheezes. She has no rales.  Breast: deferred   Abdominal: Soft. She exhibits no distension. There is no tenderness.  Lymphadenopathy: She has no cervical adenopathy.  Skin: Skin is warm and dry. She is not diaphoretic.  Psychiatric: She has a normal mood and affect. Her behavior is normal.        Assessment & Plan:   Physical exam: Screening blood work    ordered Immunizations  Discussed covid booster Colonoscopy  Up to date  Mammogram  Due - will schedule Gyn  Due - will schedule Eye exams  Up to date  Exercise  walking Weight   Working on weight loss Substance abuse   none        See Problem List for Assessment and Plan of chronic medical problems.

## 2021-04-12 NOTE — Patient Instructions (Addendum)
Blood work was ordered.     Medications changes include :   none     Please followup in 6 months     Health Maintenance, Female Adopting a healthy lifestyle and getting preventive care are important in promoting health and wellness. Ask your health care provider about:  The right schedule for you to have regular tests and exams.  Things you can do on your own to prevent diseases and keep yourself healthy. What should I know about diet, weight, and exercise? Eat a healthy diet  Eat a diet that includes plenty of vegetables, fruits, low-fat dairy products, and lean protein.  Do not eat a lot of foods that are high in solid fats, added sugars, or sodium.   Maintain a healthy weight Body mass index (BMI) is used to identify weight problems. It estimates body fat based on height and weight. Your health care provider can help determine your BMI and help you achieve or maintain a healthy weight. Get regular exercise Get regular exercise. This is one of the most important things you can do for your health. Most adults should:  Exercise for at least 150 minutes each week. The exercise should increase your heart rate and make you sweat (moderate-intensity exercise).  Do strengthening exercises at least twice a week. This is in addition to the moderate-intensity exercise.  Spend less time sitting. Even light physical activity can be beneficial. Watch cholesterol and blood lipids Have your blood tested for lipids and cholesterol at 60 years of age, then have this test every 5 years. Have your cholesterol levels checked more often if:  Your lipid or cholesterol levels are high.  You are older than 60 years of age.  You are at high risk for heart disease. What should I know about cancer screening? Depending on your health history and family history, you may need to have cancer screening at various ages. This may include screening for:  Breast cancer.  Cervical  cancer.  Colorectal cancer.  Skin cancer.  Lung cancer. What should I know about heart disease, diabetes, and high blood pressure? Blood pressure and heart disease  High blood pressure causes heart disease and increases the risk of stroke. This is more likely to develop in people who have high blood pressure readings, are of African descent, or are overweight.  Have your blood pressure checked: ? Every 3-5 years if you are 18-39 years of age. ? Every year if you are 40 years old or older. Diabetes Have regular diabetes screenings. This checks your fasting blood sugar level. Have the screening done:  Once every three years after age 40 if you are at a normal weight and have a low risk for diabetes.  More often and at a younger age if you are overweight or have a high risk for diabetes. What should I know about preventing infection? Hepatitis B If you have a higher risk for hepatitis B, you should be screened for this virus. Talk with your health care provider to find out if you are at risk for hepatitis B infection. Hepatitis C Testing is recommended for:  Everyone born from 1945 through 1965.  Anyone with known risk factors for hepatitis C. Sexually transmitted infections (STIs)  Get screened for STIs, including gonorrhea and chlamydia, if: ? You are sexually active and are younger than 60 years of age. ? You are older than 60 years of age and your health care provider tells you that you are at risk for this type   infection. ? Your sexual activity has changed since you were last screened, and you are at increased risk for chlamydia or gonorrhea. Ask your health care provider if you are at risk.  Ask your health care provider about whether you are at high risk for HIV. Your health care provider may recommend a prescription medicine to help prevent HIV infection. If you choose to take medicine to prevent HIV, you should first get tested for HIV. You should then be tested every 3 months for as long as  you are taking the medicine. Pregnancy  If you are about to stop having your period (premenopausal) and you may become pregnant, seek counseling before you get pregnant.  Take 400 to 800 micrograms (mcg) of folic acid every day if you become pregnant.  Ask for birth control (contraception) if you want to prevent pregnancy. Osteoporosis and menopause Osteoporosis is a disease in which the bones lose minerals and strength with aging. This can result in bone fractures. If you are 65 years old or older, or if you are at risk for osteoporosis and fractures, ask your health care provider if you should:  Be screened for bone loss.  Take a calcium or vitamin D supplement to lower your risk of fractures.  Be given hormone replacement therapy (HRT) to treat symptoms of menopause. Follow these instructions at home: Lifestyle  Do not use any products that contain nicotine or tobacco, such as cigarettes, e-cigarettes, and chewing tobacco. If you need help quitting, ask your health care provider.  Do not use street drugs.  Do not share needles.  Ask your health care provider for help if you need support or information about quitting drugs. Alcohol use  Do not drink alcohol if: ? Your health care provider tells you not to drink. ? You are pregnant, may be pregnant, or are planning to become pregnant.  If you drink alcohol: ? Limit how much you use to 0-1 drink a day. ? Limit intake if you are breastfeeding.  Be aware of how much alcohol is in your drink. In the [place] one drink equals one 12 oz bottle of beer (355 mL), one 5 oz glass of wine (148 mL), or one 1 oz glass of hard liquor (44 mL). General instructions  Schedule regular health, dental, and eye exams.  Stay current with your vaccines.  Tell your health care provider if: ? You often feel depressed. ? You have ever been abused or do not feel safe at home. Summary  Adopting a healthy lifestyle and getting preventive care are  important in promoting health and wellness.  Follow your health care provider's instructions about healthy diet, exercising, and getting tested or screened for diseases.  Follow your health care provider's instructions on monitoring your cholesterol and blood pressure. This information is not intended to replace advice given to you by your health care provider. Make sure you discuss any questions you have with your health care provider. Document Revised: 11/19/2018 Document Reviewed: 11/19/2018 Elsevier Patient Education  2021 Elsevier Inc.  

## 2021-04-13 ENCOUNTER — Other Ambulatory Visit: Payer: Self-pay

## 2021-04-13 ENCOUNTER — Ambulatory Visit (INDEPENDENT_AMBULATORY_CARE_PROVIDER_SITE_OTHER): Payer: 59 | Admitting: Internal Medicine

## 2021-04-13 ENCOUNTER — Encounter: Payer: Self-pay | Admitting: Internal Medicine

## 2021-04-13 VITALS — BP 124/82 | HR 88 | Temp 98.1°F | Ht 65.0 in | Wt 187.0 lb

## 2021-04-13 DIAGNOSIS — Z0000 Encounter for general adult medical examination without abnormal findings: Secondary | ICD-10-CM

## 2021-04-13 DIAGNOSIS — I10 Essential (primary) hypertension: Secondary | ICD-10-CM

## 2021-04-13 DIAGNOSIS — E782 Mixed hyperlipidemia: Secondary | ICD-10-CM | POA: Diagnosis not present

## 2021-04-13 DIAGNOSIS — F411 Generalized anxiety disorder: Secondary | ICD-10-CM | POA: Diagnosis not present

## 2021-04-13 DIAGNOSIS — J4520 Mild intermittent asthma, uncomplicated: Secondary | ICD-10-CM

## 2021-04-13 DIAGNOSIS — E119 Type 2 diabetes mellitus without complications: Secondary | ICD-10-CM

## 2021-04-13 DIAGNOSIS — K219 Gastro-esophageal reflux disease without esophagitis: Secondary | ICD-10-CM

## 2021-04-13 DIAGNOSIS — G479 Sleep disorder, unspecified: Secondary | ICD-10-CM

## 2021-04-13 LAB — COMPREHENSIVE METABOLIC PANEL
ALT: 18 U/L (ref 0–35)
AST: 18 U/L (ref 0–37)
Albumin: 4.3 g/dL (ref 3.5–5.2)
Alkaline Phosphatase: 68 U/L (ref 39–117)
BUN: 11 mg/dL (ref 6–23)
CO2: 31 mEq/L (ref 19–32)
Calcium: 9.9 mg/dL (ref 8.4–10.5)
Chloride: 103 mEq/L (ref 96–112)
Creatinine, Ser: 0.93 mg/dL (ref 0.40–1.20)
GFR: 67.31 mL/min (ref >60.00)
Glucose, Bld: 90 mg/dL (ref 70–99)
Potassium: 3.9 mEq/L (ref 3.5–5.1)
Sodium: 141 mEq/L (ref 135–145)
Total Bilirubin: 0.3 mg/dL (ref 0.2–1.2)
Total Protein: 7.4 g/dL (ref 6.0–8.3)

## 2021-04-13 LAB — CBC WITH DIFFERENTIAL/PLATELET
Basophils Absolute: 0.1 10*3/uL (ref 0.0–0.1)
Basophils Relative: 1 % (ref 0.0–3.0)
Eosinophils Absolute: 0.3 10*3/uL (ref 0.0–0.7)
Eosinophils Relative: 4 % (ref 0.0–5.0)
HCT: 39.2 % (ref 36.0–46.0)
Hemoglobin: 12.8 g/dL (ref 12.0–15.0)
Lymphocytes Relative: 24.6 % (ref 12.0–46.0)
Lymphs Abs: 1.8 10*3/uL (ref 0.7–4.0)
MCHC: 32.7 g/dL (ref 30.0–36.0)
MCV: 83 fl (ref 78.0–100.0)
Monocytes Absolute: 0.7 10*3/uL (ref 0.1–1.0)
Monocytes Relative: 8.9 % (ref 3.0–12.0)
Neutro Abs: 4.6 10*3/uL (ref 1.4–7.7)
Neutrophils Relative %: 61.5 % (ref 43.0–77.0)
Platelets: 243 10*3/uL (ref 150.0–400.0)
RBC: 4.73 Mil/uL (ref 3.87–5.11)
RDW: 16.9 % — ABNORMAL HIGH (ref 11.5–15.5)
WBC: 7.5 10*3/uL (ref 4.0–10.5)

## 2021-04-13 LAB — LIPID PANEL
Cholesterol: 174 mg/dL (ref 0–200)
HDL: 74.1 mg/dL (ref >39.00)
LDL Cholesterol: 76 mg/dL (ref 0–99)
NonHDL: 99.6
Total CHOL/HDL Ratio: 2
Triglycerides: 118 mg/dL (ref 0.0–149.0)
VLDL: 23.6 mg/dL (ref 0.0–40.0)

## 2021-04-13 LAB — TSH: TSH: 1.83 u[IU]/mL (ref 0.35–4.50)

## 2021-04-13 LAB — HEMOGLOBIN A1C: Hgb A1c MFr Bld: 6.4 % (ref 4.6–6.5)

## 2021-04-13 NOTE — Assessment & Plan Note (Signed)
Chronic Controlled, stable Continue amitriptyline 25-75 mg nightly, trazodone 150 mg nightly

## 2021-04-13 NOTE — Assessment & Plan Note (Signed)
Chronic Blood pressure well controlled Currently not taking the lisinopril and therefore is not on any medication Will monitor off medication CMP

## 2021-04-13 NOTE — Assessment & Plan Note (Signed)
Chronic Mild, persistent Controlled Continue Flovent 2 puffs twice daily, Singulair 10 mg daily and albuterol inhaler as needed

## 2021-04-13 NOTE — Assessment & Plan Note (Signed)
Chronic Controlled Check A1c today Continue Ozempic 1 mg weekly Continue regular exercise and diabetic diet

## 2021-04-13 NOTE — Assessment & Plan Note (Signed)
Chronic Controlled Continue amitriptyline 25-75 mg at bedtime, which may be helping

## 2021-04-13 NOTE — Assessment & Plan Note (Signed)
Chronic GERD controlled Continue omeprazole 40 mg daily She is working on weight loss and hopefully with further weight loss she will be able to decrease the omeprazole to every other day-she states she will try that

## 2021-04-13 NOTE — Assessment & Plan Note (Signed)
Chronic Check lipid panel, CMP Continue pravastatin 10 mg daily Continue regular exercise, weight loss efforts and healthy diet

## 2021-07-12 ENCOUNTER — Other Ambulatory Visit: Payer: Self-pay | Admitting: Internal Medicine

## 2021-08-12 ENCOUNTER — Encounter: Payer: Self-pay | Admitting: Internal Medicine

## 2021-09-17 ENCOUNTER — Other Ambulatory Visit: Payer: Self-pay | Admitting: Internal Medicine

## 2021-09-19 ENCOUNTER — Other Ambulatory Visit: Payer: Self-pay | Admitting: Internal Medicine

## 2021-10-15 ENCOUNTER — Other Ambulatory Visit: Payer: Self-pay

## 2021-10-15 NOTE — Patient Instructions (Addendum)
     Blood work was ordered.     Medications changes include :   buspar 5 mg 2-3 times a day   Your prescription(s) have been submitted to your pharmacy. Please take as directed and contact our office if you believe you are having problem(s) with the medication(s).    Please followup in 6 months

## 2021-10-15 NOTE — Progress Notes (Signed)
Subjective:    Patient ID: Morgan Andrews, female    DOB: 28-Jul-1961, 60 y.o.   MRN: [id number]  This visit occurred during the SARS-CoV-2 public health emergency.  Safety protocols were in place, including screening questions prior to the visit, additional usage of staff PPE, and extensive cleaning of exam room while observing appropriate contact time as indicated for disinfecting solutions.     HPI The patient is here for follow up of their chronic medical problems, including DM, hichol, gerd, asthma, insomnia, anxiety  Has been having chest pain for the past 3 weeks - may occur 2-3 times a week. Occurs with rest and driving.  Does not occur with exercise or activity.  She does feel anxious when it occurs.  Her anxiety is not controlled. She needs to go into a store with someone because she does not like crowds.  Sleep and appetite are normal.  Denies SOB, palps or lightheadedness.  No chest  pain with walking or stairs.    Medications and allergies reviewed with patient and updated if appropriate.  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   Diabetes ([place]) 09/05/2013   Hyperlipidemia 09/02/2013   Cervical disc disease 07/26/2013   Chest pain 07/10/2012   Anxiety disorder 04/18/2012   HTN (hypertension) 04/18/2012   NONSPECIFIC ABNORMAL ELECTROCARDIOGRAM 04/04/2010   ALLERGIC RHINITIS 01/25/2009   Asthma 01/25/2009   ECZEMA 01/25/2009   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 01/25/2009    Current Outpatient Medications on File Prior to Visit  Medication Sig Dispense Refill   albuterol (VENTOLIN HFA) 108 (90 Base) MCG/ACT inhaler INHALE 2 PUFFS BY MOUTH EVERY 6 HOURS AS NEEDED FOR WHEEZING OR SHORTNESSOF BREATH 18 each 0   amitriptyline (ELAVIL) 25 MG tablet Take 1 to 3 tablets by mouth at bedtime 270 tablet 1    estradiol (ESTRACE VAGINAL) 0.1 MG/GM vaginal cream Place 1 Applicatorful vaginally 3 (three) times a week. 42.5 g 12   Fluticasone Propionate, Inhal, (FLOVENT DISKUS) 250 MCG/BLIST AEPB Inhale 1-2 puffs into the lungs in the morning and at bedtime. 180 each 3   ipratropium-albuterol (DUONEB) 0.5-2.5 (3) MG/3ML SOLN Take 3 mLs by nebulization as needed.     montelukast (SINGULAIR) 10 MG tablet Take 1 tablet (10 mg total) by mouth daily. 90 tablet 3   omeprazole (PRILOSEC) 40 MG capsule TAKE ONE CAPSULE BY MOUTH ONE TIME DAILY 90 capsule 0   OZEMPIC, 1 MG/DOSE, 4 MG/3ML SOPN INJECT 1MG  INTO THE SKIN ONCE A WEEK , TO START AFTER ONE MONTH OF THE 0.5MG  DOSE 9 mL 0   pravastatin (PRAVACHOL) 10 MG tablet Take 1 tablet (10 mg total) by mouth daily. 90 tablet 3   traZODone (DESYREL) 150 MG tablet Take 1 tablet (150 mg total) by mouth at bedtime. 90 tablet 3   No current facility-administered medications on file prior to visit.    Past Medical History:  Diagnosis Date   Anemia    Anxiety    classic Panic attacks with sweating & chest pain   Asthma    Basal cell carcinoma    Cervical radiculopathy    C5-6   Diabetes mellitus without complication ([facility])    Eczema    Esophageal stricture    GERD (gastroesophageal reflux disease)    Hiatal hernia    Hypertension  Seasonal allergies     Past Surgical History:  Procedure Laterality Date   ANKLE SURGERY Right 1999-2002   X 5, Dr Percell Miller   CERVICAL FUSION  2008, 10/05/13   x 3 fusion, discectomy & plating , Dr Arnoldo Morale   no colonoscopy     PARTIAL HYSTERECTOMY     SHOULDER ARTHROSCOPY WITH SUBACROMIAL DECOMPRESSION, ROTATOR CUFF REPAIR AND BICEP TENDON REPAIR Left 07/01/2014   Procedure: LEFT SHOULDER ARTHROSCOPY  DEBRIDEMENT EXTENTSIVE,DISTAL CLAVICULECTOMY,DECOMPRESSION SUBACROMIAL PARTIAL ACROMIOPLASTY WITH CORACOACROMIAL RELEASE,ARTHROSCOPY SHOULDER WITH ROTATOR CUFF REPAIR.;  Surgeon: Ninetta Lights, MD;  Location: [facility];  Service: Orthopedics;  Laterality: Left;   TONSILLECTOMY AND ADENOIDECTOMY     UPPER GASTROINTESTINAL ENDOSCOPY  2007   negative   WRIST SURGERY Left     Social History   Socioeconomic History   Marital status: Married    Spouse name: Not on file   Number of children: 3   Years of education: Not on file   Highest education level: Not on file  Occupational History   Occupation: disalbled  Tobacco Use   Smoking status: Never   Smokeless tobacco: Never  Substance and Sexual Activity   Alcohol use: Yes    Comment: OCC-WINE   Drug use: No   Sexual activity: Not on file  Other Topics Concern   Not on file  Social History Narrative   Not on file   Social Determinants of Health   Financial Resource Strain: Not on file  Food Insecurity: Not on file  Transportation Needs: Not on file  Physical Activity: Not on file  Stress: Not on file  Social Connections: Not on file    Family History  Adopted: Yes  Problem Relation Age of Onset   Asthma Mother    Hypertension Mother    Anxiety disorder Mother        with panic attacks & depression   Depression Mother    Hypertension Maternal Aunt    Stroke Maternal Aunt        >65   Rheum arthritis Maternal Aunt    Anxiety disorder Sister        as above   Breast cancer Sister        half sisiter, unsure whisch side of family   Diabetes Neg Hx    Colon cancer Neg Hx     Review of Systems  Constitutional:  Negative for chills and fever.  Respiratory:  Negative for cough, shortness of breath and wheezing.   Cardiovascular:  Positive for chest pain. Negative for palpitations and leg swelling.  Gastrointestinal:  Negative for abdominal pain.  Neurological:  Negative for light-headedness and headaches.      Objective:   Vitals:   10/16/21 1030  BP: 120/74  Pulse: 72  Temp: 98.2 F (36.8 C)  SpO2: 98%   BP Readings from Last 3 Encounters:  10/16/21 120/74  04/13/21 124/82  10/10/20 128/78   Wt Readings  from Last 3 Encounters:  10/16/21 192 lb (87.1 kg)  04/13/21 187 lb (84.8 kg)  10/10/20 195 lb (88.5 kg)   Body mass index is 31.95 kg/m.   Depression screen [facility] 2/9 10/16/2021 10/11/2020 11/15/2014 09/02/2013  Decreased Interest 0 0 0 0  Down, Depressed, Hopeless 0 0 0 0  PHQ - 2 Score 0 0 0 0  Altered sleeping 1 - - -  Tired, decreased energy 0 - - -  Change in appetite 1 - - -  Feeling bad or failure  about yourself  0 - - -  Trouble concentrating 0 - - -  Moving slowly or fidgety/restless 0 - - -  Suicidal thoughts 0 - - -  PHQ-9 Score 2 - - -  Difficult doing work/chores Not difficult at all - - -    GAD 7 : Generalized Anxiety Score 10/16/2021  Nervous, Anxious, on Edge 2  Control/stop worrying 0  Worry too much - different things 1  Trouble relaxing 0  Restless 0  Easily annoyed or irritable 0  Afraid - awful might happen 0  Total GAD 7 Score 3  Anxiety Difficulty Not difficult at all       Physical Exam    Constitutional: Appears well-developed and well-nourished. No distress.  HENT:  Head: Normocephalic and atraumatic.  Neck: Neck supple. No tracheal deviation present. No thyromegaly present.  No cervical lymphadenopathy Cardiovascular: Normal rate, regular rhythm and normal heart sounds.   No murmur heard. No carotid bruit .  No edema Pulmonary/Chest: Effort normal and breath sounds normal. No respiratory distress. No has no wheezes. No rales.  Skin: Skin is warm and dry. Not diaphoretic.  Psychiatric: Normal mood and affect. Behavior is normal.      Assessment & Plan:    Screened for depression using the PHQ 9 scale.  No evidence of depression.    See Problem List for Assessment and Plan of chronic medical problems.

## 2021-10-16 ENCOUNTER — Other Ambulatory Visit: Payer: Self-pay

## 2021-10-16 ENCOUNTER — Encounter: Payer: Self-pay | Admitting: Internal Medicine

## 2021-10-16 ENCOUNTER — Ambulatory Visit (INDEPENDENT_AMBULATORY_CARE_PROVIDER_SITE_OTHER): Payer: 59 | Admitting: Internal Medicine

## 2021-10-16 VITALS — BP 120/74 | HR 72 | Temp 98.2°F | Ht 65.0 in | Wt 192.0 lb

## 2021-10-16 DIAGNOSIS — E119 Type 2 diabetes mellitus without complications: Secondary | ICD-10-CM | POA: Diagnosis not present

## 2021-10-16 DIAGNOSIS — K219 Gastro-esophageal reflux disease without esophagitis: Secondary | ICD-10-CM

## 2021-10-16 DIAGNOSIS — Z1331 Encounter for screening for depression: Secondary | ICD-10-CM | POA: Diagnosis not present

## 2021-10-16 DIAGNOSIS — E782 Mixed hyperlipidemia: Secondary | ICD-10-CM

## 2021-10-16 DIAGNOSIS — G479 Sleep disorder, unspecified: Secondary | ICD-10-CM

## 2021-10-16 DIAGNOSIS — J4520 Mild intermittent asthma, uncomplicated: Secondary | ICD-10-CM

## 2021-10-16 DIAGNOSIS — R0789 Other chest pain: Secondary | ICD-10-CM

## 2021-10-16 DIAGNOSIS — F411 Generalized anxiety disorder: Secondary | ICD-10-CM | POA: Diagnosis not present

## 2021-10-16 DIAGNOSIS — I10 Essential (primary) hypertension: Secondary | ICD-10-CM | POA: Diagnosis not present

## 2021-10-16 LAB — COMPREHENSIVE METABOLIC PANEL
ALT: 18 U/L (ref 0–35)
AST: 20 U/L (ref 0–37)
Albumin: 4.4 g/dL (ref 3.5–5.2)
Alkaline Phosphatase: 66 U/L (ref 39–117)
BUN: 14 mg/dL (ref 6–23)
CO2: 32 mEq/L (ref 19–32)
Calcium: 9.8 mg/dL (ref 8.4–10.5)
Chloride: 102 mEq/L (ref 96–112)
Creatinine, Ser: 0.9 mg/dL (ref 0.40–1.20)
GFR: 69.76 mL/min (ref >60.00)
Glucose, Bld: 86 mg/dL (ref 70–99)
Potassium: 4.8 mEq/L (ref 3.5–5.1)
Sodium: 140 mEq/L (ref 135–145)
Total Bilirubin: 0.3 mg/dL (ref 0.2–1.2)
Total Protein: 7.6 g/dL (ref 6.0–8.3)

## 2021-10-16 LAB — LIPID PANEL
Cholesterol: 185 mg/dL (ref 0–200)
HDL: 78.2 mg/dL (ref >39.00)
LDL Cholesterol: 83 mg/dL (ref 0–99)
NonHDL: 106.9
Total CHOL/HDL Ratio: 2
Triglycerides: 121 mg/dL (ref 0.0–149.0)
VLDL: 24.2 mg/dL (ref 0.0–40.0)

## 2021-10-16 LAB — HEMOGLOBIN A1C: Hgb A1c MFr Bld: 6.5 % (ref 4.6–6.5)

## 2021-10-16 MED ORDER — BUSPIRONE HCL 5 MG PO TABS: 5.0000 mg | ORAL_TABLET | Freq: Three times a day (TID) | ORAL | 5 refills | Status: DC

## 2021-10-16 MED ORDER — ALBUTEROL SULFATE HFA 108 (90 BASE) MCG/ACT IN AERS: INHALATION_SPRAY | RESPIRATORY_TRACT | 0 refills | Status: DC

## 2021-10-16 MED ORDER — ALBUTEROL SULFATE HFA 108 (90 BASE) MCG/ACT IN AERS: INHALATION_SPRAY | RESPIRATORY_TRACT | 5 refills | Status: DC

## 2021-10-16 NOTE — Assessment & Plan Note (Signed)
Chronic GERD controlled Continue omeprazole 40 mg daily 

## 2021-10-16 NOTE — Assessment & Plan Note (Signed)
Chronic Controlled, Stable Continue trazodone 150 mg nightly

## 2021-10-16 NOTE — Assessment & Plan Note (Signed)
Chronic Regular exercise and healthy diet encouraged Check lipid panel  Continue pravastatin 10 mg daily 

## 2021-10-16 NOTE — Assessment & Plan Note (Signed)
Acute Has been experiencing chest pain 2-3 times a week with rest, not with activity.  Tends to occur when she is anxious Will adjust anxiety medications Discussed possibly getting an EKG-she deferred for now She knows that if her chest pain does not improve with the adjustment of her anxiety medication she needs to call so we can evaluate further

## 2021-10-16 NOTE — Assessment & Plan Note (Signed)
Chronic Mild, persistent Controlled Continue Flovent inhaler twice daily, albuterol inhaler as needed Continue Singulair 10 mg daily

## 2021-10-16 NOTE — Assessment & Plan Note (Signed)
Chronic Not ideally controlled She has been experiencing some chest pain that is related to her anxiety-GAD-7 score indicates anxiety Discussed options Will continue amitriptyline 25 mg at night Start buspirone 5 mg 2-3 times daily-can titrate if tolerated

## 2021-10-16 NOTE — Assessment & Plan Note (Signed)
Chronic Lab Results  Component Value Date   HGBA1C 6.4 04/13/2021   Controlled Check A1c today Continue Ozempic 1 mg weekly

## 2021-11-09 ENCOUNTER — Encounter: Payer: Self-pay | Admitting: Internal Medicine

## 2021-11-18 ENCOUNTER — Other Ambulatory Visit: Payer: Self-pay | Admitting: Internal Medicine

## 2021-11-23 ENCOUNTER — Other Ambulatory Visit: Payer: Self-pay | Admitting: Internal Medicine

## 2021-11-24 ENCOUNTER — Encounter: Payer: Self-pay | Admitting: Internal Medicine

## 2021-11-24 MED ORDER — LISINOPRIL 10 MG PO TABS: 10.0000 mg | ORAL_TABLET | Freq: Every day | ORAL | 1 refills | Status: DC

## 2021-11-24 NOTE — Telephone Encounter (Signed)
Med is not on med list. Per chart Lisinopril was removed 04/13/21.Marland Kitchen it states pt is not taking.Pls advise on renewal for lisinopril.Marland KitchenJohny Chess

## 2021-12-16 ENCOUNTER — Other Ambulatory Visit: Payer: Self-pay | Admitting: Internal Medicine

## 2021-12-20 ENCOUNTER — Other Ambulatory Visit: Payer: Self-pay | Admitting: Internal Medicine

## 2021-12-21 ENCOUNTER — Encounter: Payer: Self-pay | Admitting: Internal Medicine

## 2021-12-25 ENCOUNTER — Encounter: Payer: Self-pay | Admitting: Internal Medicine

## 2022-01-01 ENCOUNTER — Telehealth: Payer: Self-pay

## 2022-01-01 NOTE — Telephone Encounter (Signed)
Notified patient via my-chart today.

## 2022-01-01 NOTE — Telephone Encounter (Signed)
Prior authorization has been ruled favorable. Cover my Meds Key: [id number]

## 2022-01-05 ENCOUNTER — Other Ambulatory Visit: Payer: Self-pay | Admitting: Internal Medicine

## 2022-01-15 ENCOUNTER — Other Ambulatory Visit: Payer: Self-pay | Admitting: Internal Medicine

## 2022-01-15 ENCOUNTER — Encounter: Payer: Self-pay | Admitting: Internal Medicine

## 2022-01-15 MED ORDER — FLUTICASONE PROPIONATE (INHAL) 250 MCG/ACT IN AEPB: 1.0000 | INHALATION_SPRAY | Freq: Two times a day (BID) | RESPIRATORY_TRACT | 5 refills | Status: DC

## 2022-01-16 ENCOUNTER — Other Ambulatory Visit: Payer: Self-pay | Admitting: Internal Medicine

## 2022-01-17 MED ORDER — METFORMIN HCL 500 MG PO TABS: 500.0000 mg | ORAL_TABLET | Freq: Two times a day (BID) | ORAL | 3 refills | Status: DC

## 2022-01-17 NOTE — Addendum Note (Signed)
Addended by: Binnie Rail on: 01/17/2022 07:14 AM   Modules accepted: Orders

## 2022-01-25 ENCOUNTER — Other Ambulatory Visit: Payer: Self-pay | Admitting: Internal Medicine

## 2022-01-31 DIAGNOSIS — E113293 Type 2 diabetes mellitus with mild nonproliferative diabetic retinopathy without macular edema, bilateral: Secondary | ICD-10-CM | POA: Diagnosis not present

## 2022-01-31 DIAGNOSIS — H2513 Age-related nuclear cataract, bilateral: Secondary | ICD-10-CM | POA: Diagnosis not present

## 2022-01-31 DIAGNOSIS — H31093 Other chorioretinal scars, bilateral: Secondary | ICD-10-CM | POA: Diagnosis not present

## 2022-02-01 ENCOUNTER — Encounter: Payer: Self-pay | Admitting: Internal Medicine

## 2022-02-06 ENCOUNTER — Other Ambulatory Visit: Payer: Self-pay

## 2022-02-06 ENCOUNTER — Other Ambulatory Visit: Payer: Self-pay | Admitting: Internal Medicine

## 2022-02-06 MED ORDER — FLUTICASONE PROPIONATE (INHAL) 250 MCG/ACT IN AEPB: 1.0000 | INHALATION_SPRAY | Freq: Two times a day (BID) | RESPIRATORY_TRACT | 5 refills | Status: DC

## 2022-02-06 MED ORDER — AMITRIPTYLINE HCL 25 MG PO TABS: ORAL_TABLET | ORAL | 1 refills | Status: DC

## 2022-02-06 MED ORDER — ESTRADIOL 0.1 MG/GM VA CREA: 1.0000 | TOPICAL_CREAM | VAGINAL | 12 refills | Status: DC

## 2022-02-06 MED ORDER — BUSPIRONE HCL 5 MG PO TABS: 5.0000 mg | ORAL_TABLET | Freq: Three times a day (TID) | ORAL | 5 refills | Status: DC

## 2022-02-06 MED ORDER — MONTELUKAST SODIUM 10 MG PO TABS
10.0000 mg | ORAL_TABLET | Freq: Every day | ORAL | 2 refills | Status: DC
Start: 2022-02-06 — End: 2022-11-12

## 2022-02-06 MED ORDER — OMEPRAZOLE 40 MG PO CPDR: 40.0000 mg | DELAYED_RELEASE_CAPSULE | Freq: Every day | ORAL | 2 refills | Status: DC

## 2022-02-06 MED ORDER — METFORMIN HCL 500 MG PO TABS: 500.0000 mg | ORAL_TABLET | Freq: Two times a day (BID) | ORAL | 3 refills | Status: DC

## 2022-02-06 MED ORDER — OZEMPIC (0.25 OR 0.5 MG/DOSE) 2 MG/1.5ML ~~LOC~~ SOPN: PEN_INJECTOR | SUBCUTANEOUS | 0 refills | Status: DC

## 2022-02-06 MED ORDER — LISINOPRIL 10 MG PO TABS: 10.0000 mg | ORAL_TABLET | Freq: Every day | ORAL | 1 refills | Status: DC

## 2022-02-06 MED ORDER — IPRATROPIUM-ALBUTEROL 0.5-2.5 (3) MG/3ML IN SOLN: 3.0000 mL | RESPIRATORY_TRACT | 2 refills | Status: DC | PRN

## 2022-02-06 MED ORDER — TRAZODONE HCL 150 MG PO TABS: 150.0000 mg | ORAL_TABLET | Freq: Every day | ORAL | 1 refills | Status: DC

## 2022-02-06 MED ORDER — PRAVASTATIN SODIUM 10 MG PO TABS: 10.0000 mg | ORAL_TABLET | Freq: Every day | ORAL | 2 refills | Status: DC

## 2022-02-06 MED ORDER — ALBUTEROL SULFATE HFA 108 (90 BASE) MCG/ACT IN AERS: INHALATION_SPRAY | RESPIRATORY_TRACT | 5 refills | Status: DC

## 2022-02-08 ENCOUNTER — Telehealth: Payer: Self-pay

## 2022-02-08 NOTE — Telephone Encounter (Signed)
Morgan Andrews (Key: [id number]) ? ?Your demographic data has been sent to [name] successfully! ? ?[name] typically takes 5-10 minutes to respond, but it may take a little longer in some cases. You will be notified by email when available. You can also check for an update later by opening this request from your dashboard. Please do not fax or call [name] to resubmit this request. If you need assistance, please chat with [name] or call us at [phone]. ? ?If it has been longer than 24 hours, please reach out to [name]. ?

## 2022-02-08 NOTE — Telephone Encounter (Signed)
Sharol Harness (Key: [id number]) ? ?Your information has been submitted to [place]. To check for an updated outcome later, reopen this PA request from your dashboard. ? ?If [name] has not responded to your request within 24 hours, contact [place] at [phone]. If you think there may be a problem with your PA request, use our live chat feature at the bottom right. ?

## 2022-02-08 NOTE — Telephone Encounter (Signed)
PA started today via cover my meds ?

## 2022-02-15 ENCOUNTER — Other Ambulatory Visit: Payer: Self-pay

## 2022-02-15 MED ORDER — FLUTICASONE PROPIONATE (INHAL) 250 MCG/ACT IN AEPB: 1.0000 | INHALATION_SPRAY | Freq: Two times a day (BID) | RESPIRATORY_TRACT | 5 refills | Status: DC

## 2022-02-15 NOTE — Telephone Encounter (Signed)
Morgan Andrews KeyToy Baker - PA Case ID: [id number] Need help? Call us at [phone] ?Outcome ?Approvedon March 2 ?Your PA request has been approved. Additional information will be provided in the approval communication. (Message 1145) ?Drug ?Fluticasone Propionate HFA 220MCG/ACT aerosol ?Form ?[name] Electronic PA Form [phone] NCPDP) ?

## 2022-02-28 ENCOUNTER — Other Ambulatory Visit: Payer: Self-pay | Admitting: Internal Medicine

## 2022-03-05 IMAGING — MG DIGITAL SCREENING BILAT W/ TOMO W/ CAD
8 series · 8 of 24 positions shown · non-contrast
Comparison: Previous exam(s).

CLINICAL DATA: Screening.

EXAM:
DIGITAL SCREENING BILATERAL MAMMOGRAM WITH TOMO AND CAD

[L CC synth-2D]
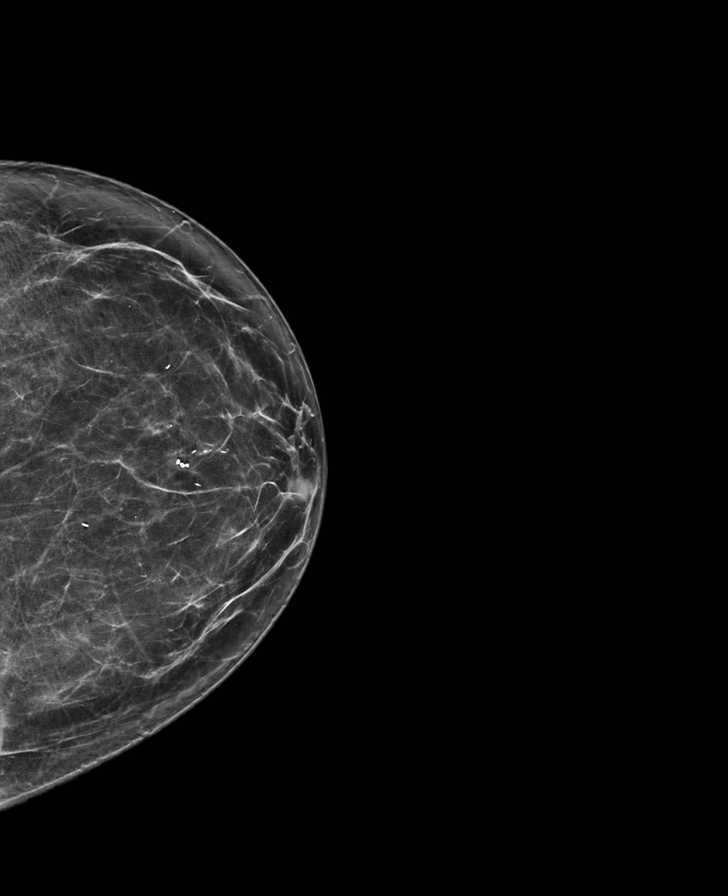

[L MLO synth-2D]
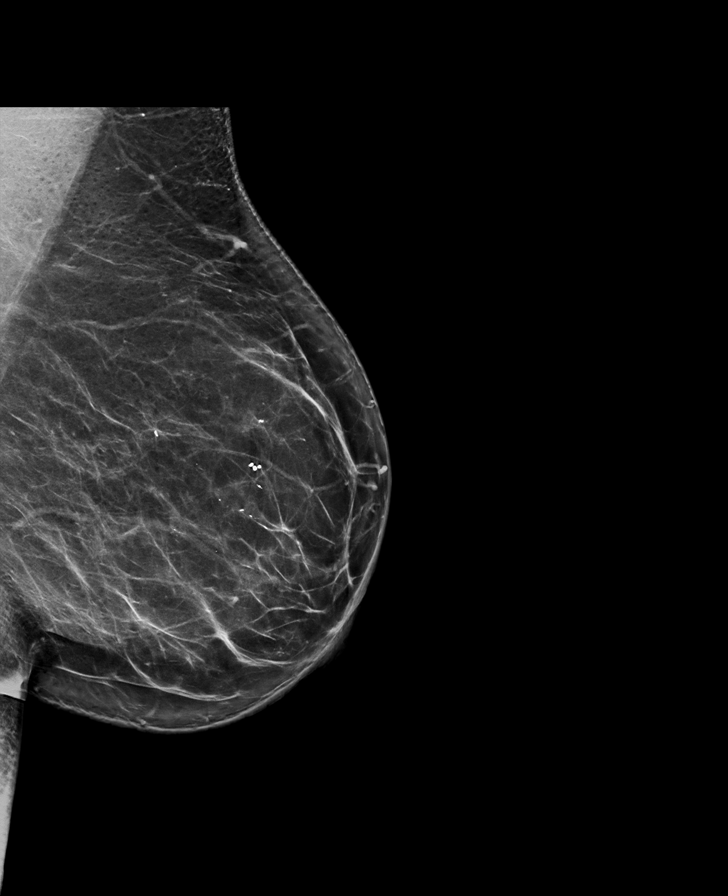

[R CC synth-2D]
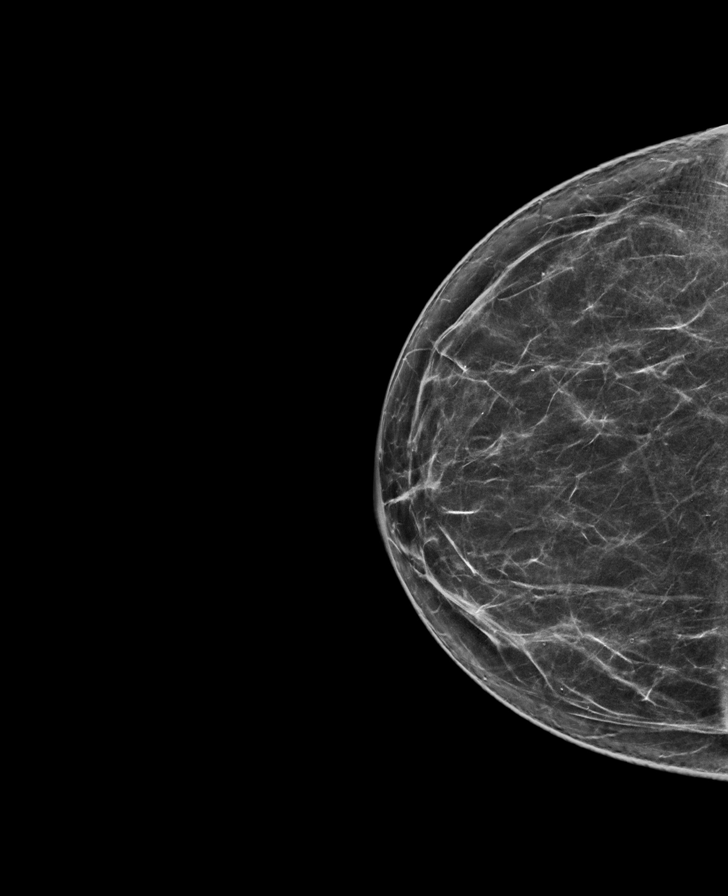

[R MLO synth-2D]
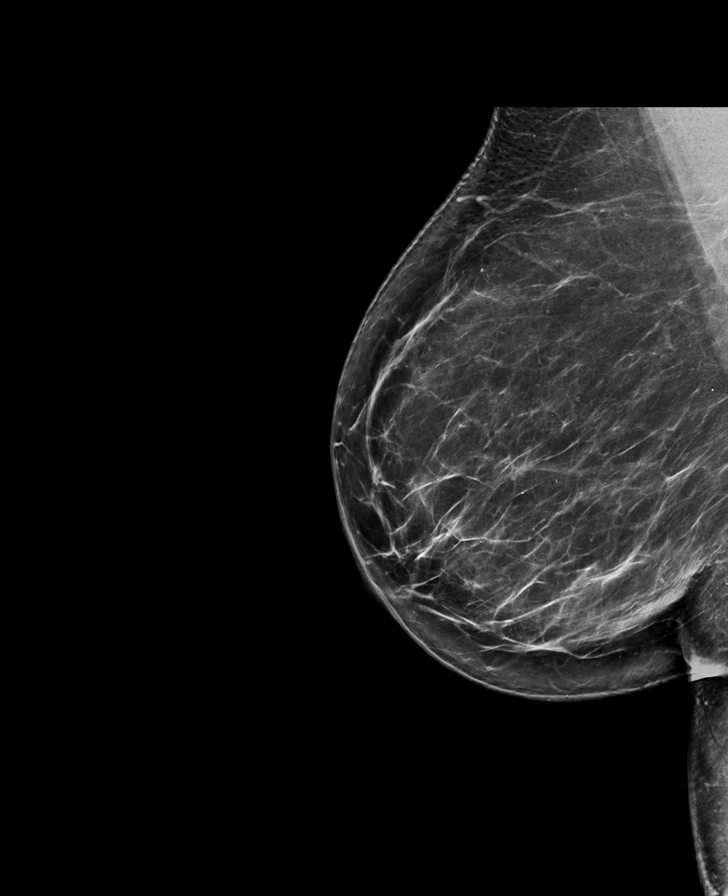

[R CC tomo · tomo slice 37/74.0]
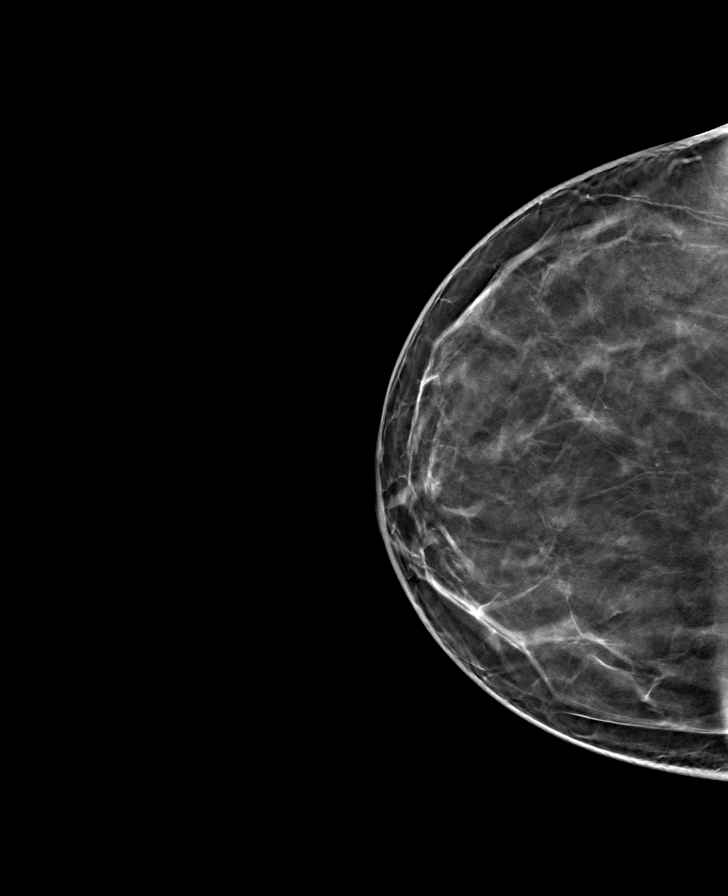

[R MLO tomo · tomo slice 43/85.0]
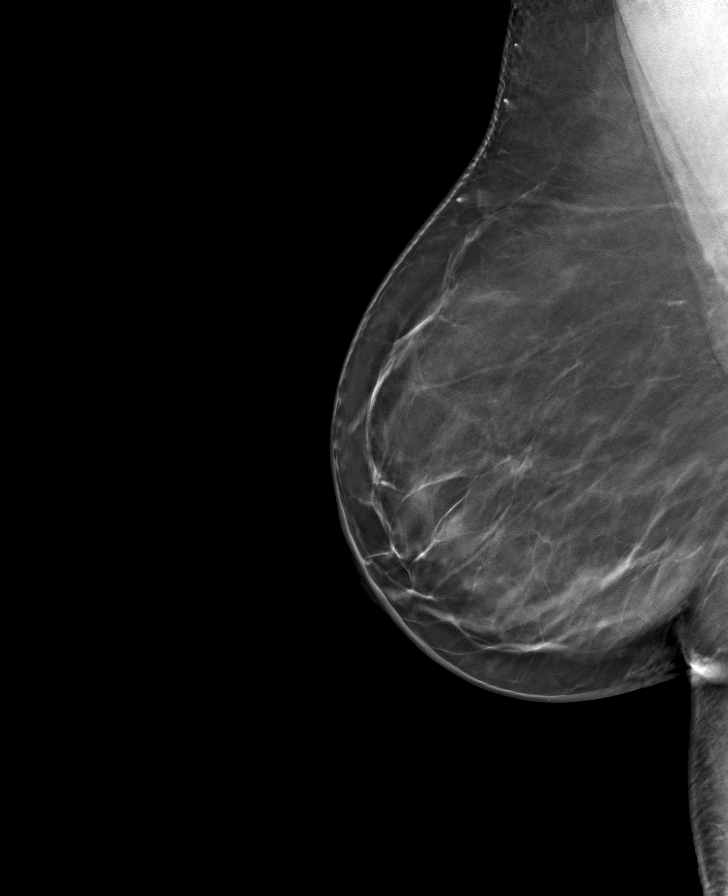

[L CC tomo · tomo slice 39/77.0]
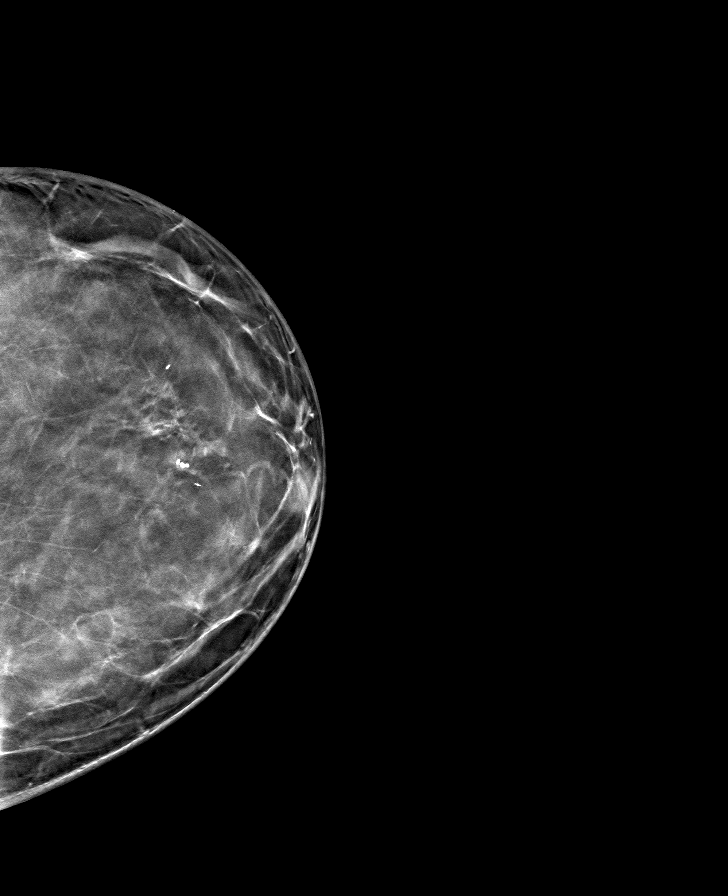

[L MLO tomo · tomo slice 43/85.0]
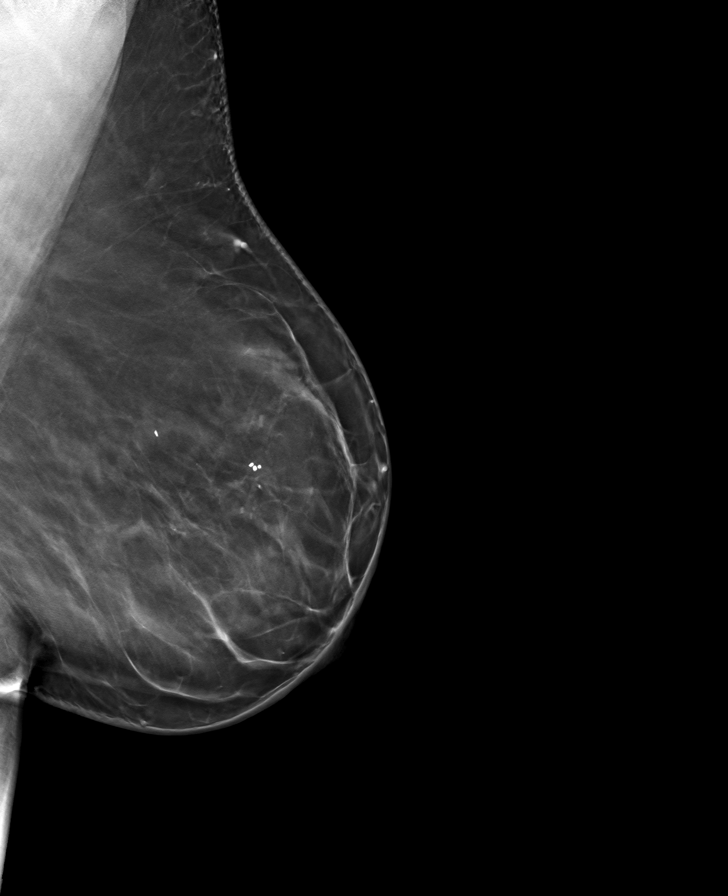

[8 of 24 positions shown; findings below may reference images not displayed]

ACR Breast Density Category b: There are scattered areas of
fibroglandular density.
FINDINGS: There are no findings suspicious for malignancy. Images were
processed with CAD.
IMPRESSION: No mammographic evidence of malignancy. A result letter of this
screening mammogram will be mailed directly to the patient.

RECOMMENDATION:
Screening mammogram in one year. (Code:[id number]-775)

BI-RADS CATEGORY  1: Negative.

## 2022-03-28 ENCOUNTER — Other Ambulatory Visit: Payer: Self-pay | Admitting: Internal Medicine

## 2022-04-09 ENCOUNTER — Telehealth: Payer: Self-pay | Admitting: Internal Medicine

## 2022-04-09 NOTE — Telephone Encounter (Signed)
Cover my meds needs to speak with you about a prior authorization for Ozempic.  Please call ?Reference Key:  [id number] ?

## 2022-04-16 ENCOUNTER — Encounter: Payer: Self-pay | Admitting: Internal Medicine

## 2022-04-16 NOTE — Patient Instructions (Addendum)
? ? ? ?  Blood work was ordered.   ? ? ?Medications changes include :   increase ozempic to 2 mg weekly ? ? ?Your prescription(s) have been sent to your pharmacy.  ? ? ? ?Return in about 6 months (around 10/18/2022) for follow up. ? ?

## 2022-04-16 NOTE — Progress Notes (Signed)
? ? ? ? ?Subjective:  ? ? Patient ID: Morgan Andrews, female    DOB: 01/01/1961, 61 y.o.   MRN: [id number] ? ? ? ? ?HPI ?Morgan Andrews is here for follow up of her chronic medical problems, including DM, hld, gerd, asthma, insomnia, anxiety ? ?Weight has increased.  She is frustrated - taking the ozempic and doing everything she should.  ? ?She is exercising regularly.   She is watching the sugars.  Her portions are small.   ? ?Medications and allergies reviewed with patient and updated if appropriate. ? ?Current Outpatient Medications on File Prior to Visit  ?Medication Sig Dispense Refill  ? ADVAIR DISKUS 250-50 MCG/ACT AEPB SMARTSIG:1-2 Puff(s) Via Inhaler Morning-Night    ? albuterol (VENTOLIN HFA) 108 (90 Base) MCG/ACT inhaler INHALE 2 PUFFS BY MOUTH EVERY 6 HOURS AS NEEDED FOR WHEEZING OR SHORTNESSOF BREATH 18 each 5  ? amitriptyline (ELAVIL) 25 MG tablet Take 1 to 3 tablets by mouth at bedtime 270 tablet 1  ? busPIRone (BUSPAR) 5 MG tablet Take 1 tablet (5 mg total) by mouth 3 (three) times daily. 90 tablet 5  ? estradiol (ESTRACE VAGINAL) 0.1 MG/GM vaginal cream Place 1 Applicatorful vaginally 3 (three) times a week. 42.5 g 12  ? Fluticasone Propionate, Inhal, 250 MCG/ACT AEPB Inhale 1-2 puffs into the lungs in the morning and at bedtime. 28 each 5  ? ibuprofen (ADVIL) 800 MG tablet Take 800 mg by mouth every 6 (six) hours.    ? ipratropium-albuterol (DUONEB) 0.5-2.5 (3) MG/3ML SOLN Take 3 mLs by nebulization as needed. 360 mL 2  ? lisinopril (ZESTRIL) 10 MG tablet Take 1 tablet (10 mg total) by mouth daily. 90 tablet 1  ? metFORMIN (GLUCOPHAGE) 500 MG tablet Take 1 tablet (500 mg total) by mouth 2 (two) times daily with a meal. 180 tablet 3  ? montelukast (SINGULAIR) 10 MG tablet Take 1 tablet (10 mg total) by mouth daily. 90 tablet 2  ? omeprazole (PRILOSEC) 40 MG capsule Take 1 capsule (40 mg total) by mouth daily. 90 capsule 2  ? pravastatin (PRAVACHOL) 10 MG tablet Take 1 tablet (10 mg total) by mouth daily. 90  tablet 2  ? Semaglutide, 1 MG/DOSE, 4 MG/3ML SOPN Inject 1 mg as directed once a week. 9 mL 1  ? traZODone (DESYREL) 150 MG tablet Take 1 tablet (150 mg total) by mouth at bedtime. 90 tablet 1  ? ?No current facility-administered medications on file prior to visit.  ? ? ? ?Review of Systems  ?Constitutional:  Negative for chills and fever.  ?Respiratory:  Negative for cough, shortness of breath and wheezing.   ?Cardiovascular:  Negative for chest pain, palpitations and leg swelling.  ?Gastrointestinal:  Negative for constipation and nausea.  ?Neurological:  Negative for light-headedness and headaches.  ? ?   ?Objective:  ? ?Vitals:  ? 04/17/22 1005  ?BP: 134/80  ?Pulse: 92  ?Temp: 98.4 ?F (36.9 ?C)  ?SpO2: 96%  ? ?BP Readings from Last 3 Encounters:  ?04/17/22 134/80  ?10/16/21 120/74  ?04/13/21 124/82  ? ?Wt Readings from Last 3 Encounters:  ?04/17/22 206 lb (93.4 kg)  ?10/16/21 192 lb (87.1 kg)  ?04/13/21 187 lb (84.8 kg)  ? ?Body mass index is 34.28 kg/m?. ? ?  ?Physical Exam ?Constitutional:   ?   General: She is not in acute distress. ?   Appearance: Normal appearance.  ?HENT:  ?   Head: Normocephalic and atraumatic.  ?Eyes:  ?   Conjunctiva/sclera: Conjunctivae  normal.  ?Cardiovascular:  ?   Rate and Rhythm: Normal rate and regular rhythm.  ?   Heart sounds: Normal heart sounds. No murmur heard. ?Pulmonary:  ?   Effort: Pulmonary effort is normal. No respiratory distress.  ?   Breath sounds: Normal breath sounds. No wheezing.  ?Musculoskeletal:  ?   Cervical back: Neck supple.  ?   Right lower leg: No edema.  ?   Left lower leg: No edema.  ?Lymphadenopathy:  ?   Cervical: No cervical adenopathy.  ?Skin: ?   General: Skin is warm and dry.  ?   Findings: No rash.  ?Neurological:  ?   Mental Status: She is alert. Mental status is at baseline.  ?Psychiatric:     ?   Mood and Affect: Mood normal.     ?   Behavior: Behavior normal.  ? ?   ? ?Lab Results  ?Component Value Date  ? WBC 7.5 04/13/2021  ? HGB 12.8  04/13/2021  ? HCT 39.2 04/13/2021  ? PLT 243.0 04/13/2021  ? GLUCOSE 86 10/16/2021  ? CHOL 185 10/16/2021  ? TRIG 121.0 10/16/2021  ? HDL 78.20 10/16/2021  ? LDLDIRECT 128.6 09/02/2013  ? [place] 83 10/16/2021  ? ALT 18 10/16/2021  ? AST 20 10/16/2021  ? NA 140 10/16/2021  ? K 4.8 10/16/2021  ? CL 102 10/16/2021  ? CREATININE 0.90 10/16/2021  ? BUN 14 10/16/2021  ? CO2 32 10/16/2021  ? TSH 1.83 04/13/2021  ? HGBA1C 6.5 10/16/2021  ? ? ? ?Assessment & Plan:  ? ? ?See Problem List for Assessment and Plan of chronic medical problems.  ? ? ?

## 2022-04-17 ENCOUNTER — Ambulatory Visit (INDEPENDENT_AMBULATORY_CARE_PROVIDER_SITE_OTHER): Payer: BC Managed Care – PPO | Admitting: Internal Medicine

## 2022-04-17 VITALS — BP 134/80 | HR 92 | Temp 98.4°F | Ht 65.0 in | Wt 206.0 lb

## 2022-04-17 DIAGNOSIS — E782 Mixed hyperlipidemia: Secondary | ICD-10-CM

## 2022-04-17 DIAGNOSIS — F411 Generalized anxiety disorder: Secondary | ICD-10-CM | POA: Diagnosis not present

## 2022-04-17 DIAGNOSIS — E119 Type 2 diabetes mellitus without complications: Secondary | ICD-10-CM

## 2022-04-17 DIAGNOSIS — E669 Obesity, unspecified: Secondary | ICD-10-CM

## 2022-04-17 DIAGNOSIS — J4520 Mild intermittent asthma, uncomplicated: Secondary | ICD-10-CM

## 2022-04-17 DIAGNOSIS — G479 Sleep disorder, unspecified: Secondary | ICD-10-CM

## 2022-04-17 DIAGNOSIS — K219 Gastro-esophageal reflux disease without esophagitis: Secondary | ICD-10-CM

## 2022-04-17 LAB — COMPREHENSIVE METABOLIC PANEL
ALT: 37 U/L — ABNORMAL HIGH (ref 0–35)
AST: 26 U/L (ref 0–37)
Albumin: 4.3 g/dL (ref 3.5–5.2)
Alkaline Phosphatase: 62 U/L (ref 39–117)
BUN: 16 mg/dL (ref 6–23)
CO2: 31 mEq/L (ref 19–32)
Calcium: 9.4 mg/dL (ref 8.4–10.5)
Chloride: 102 mEq/L (ref 96–112)
Creatinine, Ser: 0.84 mg/dL (ref 0.40–1.20)
GFR: 75.52 mL/min (ref >60.00)
Glucose, Bld: 99 mg/dL (ref 70–99)
Potassium: 4.3 mEq/L (ref 3.5–5.1)
Sodium: 140 mEq/L (ref 135–145)
Total Bilirubin: 0.2 mg/dL (ref 0.2–1.2)
Total Protein: 7.5 g/dL (ref 6.0–8.3)

## 2022-04-17 LAB — LIPID PANEL
Cholesterol: 178 mg/dL (ref 0–200)
HDL: 110.3 mg/dL (ref >39.00)
LDL Cholesterol: 52 mg/dL (ref 0–99)
NonHDL: 68.15
Total CHOL/HDL Ratio: 2
Triglycerides: 80 mg/dL (ref 0.0–149.0)
VLDL: 16 mg/dL (ref 0.0–40.0)

## 2022-04-17 LAB — MICROALBUMIN / CREATININE URINE RATIO
Creatinine,U: 190.4 mg/dL
Microalb Creat Ratio: 2.7 mg/g (ref 0.0–30.0)
Microalb, Ur: 5.1 mg/dL — ABNORMAL HIGH (ref 0.0–1.9)

## 2022-04-17 LAB — HEMOGLOBIN A1C: Hgb A1c MFr Bld: 6.9 % — ABNORMAL HIGH (ref 4.6–6.5)

## 2022-04-17 MED ORDER — ALBUTEROL SULFATE HFA 108 (90 BASE) MCG/ACT IN AERS: INHALATION_SPRAY | RESPIRATORY_TRACT | 5 refills | Status: DC

## 2022-04-17 MED ORDER — SEMAGLUTIDE (2 MG/DOSE) 8 MG/3ML ~~LOC~~ SOPN: 2.0000 mg | PEN_INJECTOR | SUBCUTANEOUS | 1 refills | Status: DC

## 2022-04-17 NOTE — Assessment & Plan Note (Signed)
Chronic ?She is exercising ?She is eating small portions and limiting her sugars ?Will increase ozempic to see if that helps to 2 mg weekly ?Continue to work on portions, increase proteins, veges ?

## 2022-04-17 NOTE — Assessment & Plan Note (Addendum)
Chronic ?Lab Results  ?Component Value Date  ? HGBA1C 6.5 10/16/2021  ? ?Sugars well controlled ?Check A1c, urine microalbumin today ?Increase Ozempic to 2 mg weekly ?Stressed regular exercise, diabetic diet ? ? ?

## 2022-04-17 NOTE — Assessment & Plan Note (Signed)
Chronic ?Controlled, Stable ?Continue trazodone 150 mg nightly ?

## 2022-04-17 NOTE — Assessment & Plan Note (Signed)
Chronic ?Mild, intermittent ?Controlled ?Continue singular 10 mg nightly, Flovent twice daily, albuterol as needed ?

## 2022-04-17 NOTE — Assessment & Plan Note (Signed)
Chronic GERD controlled Continue omeprazole 40 mg daily 

## 2022-04-17 NOTE — Assessment & Plan Note (Signed)
Chronic Regular exercise and healthy diet encouraged Check lipid panel  Continue pravastatin 10 mg daily 

## 2022-04-17 NOTE — Assessment & Plan Note (Signed)
Chronic Controlled, Stable Continue amitriptyline 25 mg nightly, buspirone 5 mg 2-3 times daily 

## 2022-04-20 NOTE — Telephone Encounter (Signed)
Your PA has been resolved, no additional PA is required

## 2022-04-25 DIAGNOSIS — L570 Actinic keratosis: Secondary | ICD-10-CM | POA: Diagnosis not present

## 2022-04-25 DIAGNOSIS — L2089 Other atopic dermatitis: Secondary | ICD-10-CM | POA: Diagnosis not present

## 2022-04-25 DIAGNOSIS — D485 Neoplasm of uncertain behavior of skin: Secondary | ICD-10-CM | POA: Diagnosis not present

## 2022-04-25 DIAGNOSIS — Z85828 Personal history of other malignant neoplasm of skin: Secondary | ICD-10-CM | POA: Diagnosis not present

## 2022-04-25 DIAGNOSIS — L821 Other seborrheic keratosis: Secondary | ICD-10-CM | POA: Diagnosis not present

## 2022-05-18 DIAGNOSIS — Z01419 Encounter for gynecological examination (general) (routine) without abnormal findings: Secondary | ICD-10-CM | POA: Diagnosis not present

## 2022-05-18 DIAGNOSIS — Z6834 Body mass index (BMI) 34.0-34.9, adult: Secondary | ICD-10-CM | POA: Diagnosis not present

## 2022-05-18 DIAGNOSIS — Z1231 Encounter for screening mammogram for malignant neoplasm of breast: Secondary | ICD-10-CM | POA: Diagnosis not present

## 2022-07-10 ENCOUNTER — Ambulatory Visit: Payer: BC Managed Care – PPO | Admitting: Internal Medicine

## 2022-07-16 ENCOUNTER — Ambulatory Visit: Payer: BC Managed Care – PPO | Admitting: Internal Medicine

## 2022-08-01 DIAGNOSIS — H2513 Age-related nuclear cataract, bilateral: Secondary | ICD-10-CM | POA: Diagnosis not present

## 2022-08-01 DIAGNOSIS — E113293 Type 2 diabetes mellitus with mild nonproliferative diabetic retinopathy without macular edema, bilateral: Secondary | ICD-10-CM | POA: Diagnosis not present

## 2022-08-01 DIAGNOSIS — H31093 Other chorioretinal scars, bilateral: Secondary | ICD-10-CM | POA: Diagnosis not present

## 2022-08-10 ENCOUNTER — Ambulatory Visit
Admission: EM | Admit: 2022-08-10 | Discharge: 2022-08-10 | Disposition: A | Discharge status: Home / Self Care | Payer: BC Managed Care – PPO | Attending: Urgent Care | Admitting: Urgent Care

## 2022-08-10 ENCOUNTER — Ambulatory Visit (INDEPENDENT_AMBULATORY_CARE_PROVIDER_SITE_OTHER): Payer: BC Managed Care – PPO

## 2022-08-10 ENCOUNTER — Other Ambulatory Visit: Payer: Self-pay | Admitting: Internal Medicine

## 2022-08-10 DIAGNOSIS — M25512 Pain in left shoulder: Secondary | ICD-10-CM

## 2022-08-10 DIAGNOSIS — M79642 Pain in left hand: Secondary | ICD-10-CM | POA: Diagnosis not present

## 2022-08-10 DIAGNOSIS — W19XXXA Unspecified fall, initial encounter: Secondary | ICD-10-CM | POA: Diagnosis not present

## 2022-08-10 DIAGNOSIS — E119 Type 2 diabetes mellitus without complications: Secondary | ICD-10-CM

## 2022-08-10 DIAGNOSIS — M7989 Other specified soft tissue disorders: Secondary | ICD-10-CM | POA: Diagnosis not present

## 2022-08-10 DIAGNOSIS — M898X1 Other specified disorders of bone, shoulder: Secondary | ICD-10-CM

## 2022-08-10 DIAGNOSIS — M19012 Primary osteoarthritis, left shoulder: Secondary | ICD-10-CM | POA: Diagnosis not present

## 2022-08-10 MED ORDER — TIZANIDINE HCL 4 MG PO TABS: 4.0000 mg | ORAL_TABLET | Freq: Every day | ORAL | 0 refills | Status: AC

## 2022-08-10 MED ORDER — PREDNISONE 20 MG PO TABS: ORAL_TABLET | ORAL | 0 refills | Status: DC

## 2022-08-10 NOTE — ED Provider Notes (Signed)
[name]  Note:  This document was prepared using Systems analyst and may include unintentional dictation errors.  MRN: [id number] DOB: 15-Jan-1961  Subjective:   Morgan Andrews is a 61 y.o. female presenting for persistent left shoulder pain, left scapula pain, left hand pain.  Symptoms started yesterday when she actually fell down a few steps from missing a step.  No head injury, loss consciousness, confusion, weakness, numbness or tingling.  Patient has had 2 separate shoulder surgeries in the past.  She also has a history of cervical radiculopathy.  Has type 2 diabetes treated without insulin.  Has previously taken steroids without any issues.  No current facility-administered medications for this encounter.  Current Outpatient Medications:    ADVAIR DISKUS 250-50 MCG/ACT AEPB, SMARTSIG:1-2 Puff(s) Via Inhaler Morning-Night, Disp: , Rfl:    albuterol (VENTOLIN HFA) 108 (90 Base) MCG/ACT inhaler, INHALE 2 PUFFS BY MOUTH EVERY 6 HOURS AS NEEDED FOR WHEEZING OR SHORTNESSOF BREATH, Disp: 18 each, Rfl: 5   amitriptyline (ELAVIL) 25 MG tablet, Take 1 to 3 tablets by mouth at bedtime, Disp: 270 tablet, Rfl: 1   busPIRone (BUSPAR) 5 MG tablet, Take 1 tablet (5 mg total) by mouth 3 (three) times daily., Disp: 90 tablet, Rfl: 5   estradiol (ESTRACE VAGINAL) 0.1 MG/GM vaginal cream, Place 1 Applicatorful vaginally 3 (three) times a week., Disp: 42.5 g, Rfl: 12   Fluticasone Propionate, Inhal, 250 MCG/ACT AEPB, Inhale 1-2 puffs into the lungs in the morning and at bedtime., Disp: 28 each, Rfl: 5   ibuprofen (ADVIL) 800 MG tablet, Take 800 mg by mouth every 6 (six) hours., Disp: , Rfl:    ipratropium-albuterol (DUONEB) 0.5-2.5 (3) MG/3ML SOLN, Take 3 mLs by nebulization as needed., Disp: 360 mL, Rfl: 2   lisinopril (ZESTRIL) 10 MG tablet, Take 1 tablet (10 mg total) by mouth daily., Disp: 90 tablet, Rfl: 1   montelukast (SINGULAIR) 10 MG tablet, Take 1  tablet (10 mg total) by mouth daily., Disp: 90 tablet, Rfl: 2   omeprazole (PRILOSEC) 40 MG capsule, Take 1 capsule (40 mg total) by mouth daily., Disp: 90 capsule, Rfl: 2   pravastatin (PRAVACHOL) 10 MG tablet, Take 1 tablet (10 mg total) by mouth daily., Disp: 90 tablet, Rfl: 2   Semaglutide, 2 MG/DOSE, 8 MG/3ML SOPN, Inject 2 mg as directed once a week., Disp: 9 mL, Rfl: 1   traZODone (DESYREL) 150 MG tablet, Take 1 tablet (150 mg total) by mouth at bedtime., Disp: 90 tablet, Rfl: 1   Allergies  Allergen Reactions   Clemastine Fumarate     REACTION: ASTHMA ATTACK( Note: Tavist D caused asthma flare from" excessive drying")   Lyrica [Pregabalin]     Pedal edema    Past Medical History:  Diagnosis Date   Anemia    Anxiety    classic Panic attacks with sweating & chest pain   Asthma    Basal cell carcinoma    Cervical radiculopathy    C5-6   Diabetes mellitus without complication (HCC)    Eczema    Esophageal stricture    GERD (gastroesophageal reflux disease)    Hiatal hernia    Hypertension    Seasonal allergies      Past Surgical History:  Procedure Laterality Date   ANKLE SURGERY Right 1999-2002   X 5, Dr Percell Miller   CERVICAL FUSION  2008, 10/05/13   x 3 fusion, discectomy & plating , Dr Arnoldo Morale   no colonoscopy  PARTIAL HYSTERECTOMY     SHOULDER ARTHROSCOPY WITH SUBACROMIAL DECOMPRESSION, ROTATOR CUFF REPAIR AND BICEP TENDON REPAIR Left 07/01/2014   Procedure: LEFT SHOULDER ARTHROSCOPY  DEBRIDEMENT EXTENTSIVE,DISTAL CLAVICULECTOMY,DECOMPRESSION SUBACROMIAL PARTIAL ACROMIOPLASTY WITH CORACOACROMIAL RELEASE,ARTHROSCOPY SHOULDER WITH ROTATOR CUFF REPAIR.;  Surgeon: Ninetta Lights, MD;  Location: [facility];  Service: Orthopedics;  Laterality: Left;   TONSILLECTOMY AND ADENOIDECTOMY     UPPER GASTROINTESTINAL ENDOSCOPY  2007   negative   WRIST SURGERY Left     Family History  Adopted: Yes  Problem Relation Age of Onset   Asthma Mother     Hypertension Mother    Anxiety disorder Mother        with panic attacks & depression   Depression Mother    Hypertension Maternal Aunt    Stroke Maternal Aunt        >65   Rheum arthritis Maternal Aunt    Anxiety disorder Sister        as above   Breast cancer Sister        half sisiter, unsure whisch side of family   Diabetes Neg Hx    Colon cancer Neg Hx     Social History   Tobacco Use   Smoking status: Never   Smokeless tobacco: Never  Substance Use Topics   Alcohol use: Yes    Comment: OCC-WINE   Drug use: No    ROS   Objective:   Vitals: BP (!) 141/87 (BP Location: Right Arm)   Pulse 89   Temp 98.5 F (36.9 C) (Oral)   Resp 16   SpO2 95%   Physical Exam Constitutional:      General: She is not in acute distress.    Appearance: Normal appearance. She is well-developed. She is not ill-appearing, toxic-appearing or diaphoretic.  HENT:     Head: Normocephalic and atraumatic.     Nose: Nose normal.     Mouth/Throat:     Mouth: Mucous membranes are moist.  Eyes:     General: No scleral icterus.       Right eye: No discharge.        Left eye: No discharge.     Extraocular Movements: Extraocular movements intact.  Cardiovascular:     Rate and Rhythm: Normal rate.  Pulmonary:     Effort: Pulmonary effort is normal.  Musculoskeletal:     Left shoulder: Tenderness and bony tenderness (posterior shoulder, scapula) present. No swelling, deformity, effusion, laceration or crepitus. Decreased range of motion. Normal strength.     Left upper arm: No swelling, edema, deformity, lacerations, tenderness or bony tenderness.     Left elbow: No swelling, deformity, effusion or lacerations. Normal range of motion. No tenderness.     Left forearm: No swelling, edema, deformity, lacerations, tenderness or bony tenderness.     Left wrist: No swelling, deformity, effusion, lacerations, tenderness, bony tenderness, snuff box tenderness or crepitus. Normal range of motion.      Left hand: Tenderness and bony tenderness (at the fingers) present. No swelling, deformity or lacerations. Normal range of motion. Normal strength. Normal sensation. There is no disruption of two-point discrimination. Normal capillary refill.  Skin:    General: Skin is warm and dry.  Neurological:     General: No focal deficit present.     Mental Status: She is alert and oriented to person, place, and time.     Cranial Nerves: No cranial nerve deficit.     Motor: No weakness.  Coordination: Coordination normal.     Gait: Gait normal.     Deep Tendon Reflexes: Reflexes normal.  Psychiatric:        Mood and Affect: Mood normal.        Behavior: Behavior normal.     DG Hand Complete Left  Result Date: 08/10/2022 CLINICAL DATA:  Left shoulder and hand pain. Fell yesterday. Pain and swelling. EXAM: LEFT HAND - COMPLETE 3+ VIEW COMPARISON:  None Available. FINDINGS: There is no evidence of fracture or dislocation. Mild interphalangeal joint space narrowing with spurring. Soft tissues are unremarkable. IMPRESSION: 1. No acute fracture or dislocation. 2. Mild osteoarthritis involving the interphalangeal joints. Electronically Signed   By: Keane Police D.O.   On: 08/10/2022 13:09   DG Shoulder Left  Result Date: 08/10/2022 CLINICAL DATA:  Left shoulder and hand pain. EXAM: LEFT SHOULDER - 2+ VIEW COMPARISON:  None Available. FINDINGS: There is no evidence of fracture or dislocation. Glenohumeral joint space narrowing with marginal osteophytes. Mild acromioclavicular osteoarthritis with osteolysis of the distal clavicle. High-riding humeral head suggesting rotator cuff arthropathy. IMPRESSION: 1.  No acute fracture or dislocation. 2.  Mild glenohumeral and acromioclavicular osteoarthritis. 3. High-riding humeral head concerning for rotator cuff arthropathy. MRI examination could be considered for further evaluation if clinically warranted. Electronically Signed   By: Keane Police D.O.   On:  08/10/2022 13:06     Assessment and Plan :   PDMP not reviewed this encounter.  1. Acute pain of left shoulder   2. Left hand pain   3. Pain of left scapula   4. Accidental fall, initial encounter   5. Type 2 diabetes mellitus treated without insulin (HCC)    Given the severity of her pain and arthritis as above on the radiology overread recommended a oral prednisone course.  Follow-up with her orthopedist for further evaluation regarding the abnormality seen on the left shoulder and possible rotator cuff arthropathy. Counseled patient on potential for adverse effects with medications prescribed/recommended today, ER and return-to-clinic precautions discussed, patient verbalized understanding.    Jaynee Eagles, [place] 08/10/22 1413

## 2022-08-10 NOTE — ED Triage Notes (Signed)
Patient states yesterday she fell down steps inside the home after missing a step.   Patient denies LOC.   Patient c/o Left Shoulder Pain, states shoulder hit the railing on the front of shoulder then landed on back.  Previous Rotator Cuff surgery. Left Hand swelling and pain.

## 2022-08-10 NOTE — ED Notes (Addendum)
Patient states yesterday she fell down steps inside the home after missing a step. Left Shoulder Pain, states shoulder hit the railing on the front of shoulder then landed on back, Previous Rotator Cuff surgery. Left Hand swelling and pain.  Pain Scale 10/10  No harmful thoughts

## 2022-08-15 ENCOUNTER — Other Ambulatory Visit: Payer: Self-pay | Admitting: Internal Medicine

## 2022-10-09 ENCOUNTER — Other Ambulatory Visit: Payer: Self-pay | Admitting: Internal Medicine

## 2022-10-14 ENCOUNTER — Encounter: Payer: Self-pay | Admitting: Internal Medicine

## 2022-10-16 MED ORDER — TIRZEPATIDE 2.5 MG/0.5ML ~~LOC~~ SOAJ: 2.5000 mg | SUBCUTANEOUS | 0 refills | Status: DC

## 2022-10-21 ENCOUNTER — Encounter: Payer: Self-pay | Admitting: Internal Medicine

## 2022-10-21 NOTE — Progress Notes (Unsigned)
Subjective:    Patient ID: Morgan Andrews, female    DOB: [birth year], 61 y.o.   MRN: [id number]     HPI Morgan Andrews is here for follow up of her chronic medical problems, including DM, hld, GERD, asthma, insomnia, anxiety, obesity  She is on the mounjaro.    Started it a few days ago - no side effects  She was sick all last week - using her inhalers as prescribed.  Symptoms have improved.   She does need a note for work.    Medications and allergies reviewed with patient and updated if appropriate.  Current Outpatient Medications on File Prior to Visit  Medication Sig Dispense Refill   ADVAIR DISKUS 250-50 MCG/ACT AEPB INHALE 1-2 PUFFS INTO THE LUNGS IN THE MORNING AND AT BEDTIME. 60 each 5   albuterol (VENTOLIN HFA) 108 (90 Base) MCG/ACT inhaler INHALE 2 PUFFS BY MOUTH EVERY 6 HOURS AS NEEDED FOR WHEEZING OR SHORTNESSOF BREATH 18 each 5   amitriptyline (ELAVIL) 25 MG tablet TAKE 1 TO 3 TABLETS BY MOUTH AT BEDTIME 270 tablet 1   busPIRone (BUSPAR) 5 MG tablet Take 1 tablet (5 mg total) by mouth 3 (three) times daily. 90 tablet 5   estradiol (ESTRACE VAGINAL) 0.1 MG/GM vaginal cream Place 1 Applicatorful vaginally 3 (three) times a week. 42.5 g 12   Fluticasone Propionate, Inhal, 250 MCG/ACT AEPB Inhale 1-2 puffs into the lungs in the morning and at bedtime. 28 each 5   ibuprofen (ADVIL) 800 MG tablet Take 800 mg by mouth every 6 (six) hours.     ipratropium-albuterol (DUONEB) 0.5-2.5 (3) MG/3ML SOLN Take 3 mLs by nebulization as needed. 360 mL 2   lisinopril (ZESTRIL) 10 MG tablet TAKE 1 TABLET BY MOUTH EVERY DAY 90 tablet 1   montelukast (SINGULAIR) 10 MG tablet Take 1 tablet (10 mg total) by mouth daily. 90 tablet 2   omeprazole (PRILOSEC) 40 MG capsule Take 1 capsule (40 mg total) by mouth daily. 90 capsule 2   pravastatin (PRAVACHOL) 10 MG tablet Take 1 tablet (10 mg total) by mouth daily. 90 tablet 2   tirzepatide (MOUNJARO) 2.5 MG/0.5ML Pen Inject 2.5 mg into the skin once a  week. 2 mL 0   tiZANidine (ZANAFLEX) 4 MG tablet Take 1 tablet (4 mg total) by mouth at bedtime. 30 tablet 0   traZODone (DESYREL) 150 MG tablet TAKE 1 TABLET BY MOUTH AT BEDTIME. 90 tablet 1   triamcinolone cream (KENALOG) 0.1 % SMARTSIG:sparingly Topical Twice Daily     No current facility-administered medications on file prior to visit.     Review of Systems  Constitutional:  Negative for chills and fever.  HENT:  Positive for congestion (last week), postnasal drip and voice change.   Respiratory:  Positive for cough (dry cough - from URI last week). Negative for shortness of breath and wheezing.   Cardiovascular:  Negative for chest pain, palpitations and leg swelling.  Neurological:  Positive for light-headedness (occ - when gets up too fast). Negative for dizziness and headaches.       Objective:   Vitals:   10/22/22 1006  BP: 128/74  Pulse: 96  Temp: 98.2 F (36.8 C)  SpO2: 98%   BP Readings from Last 3 Encounters:  10/22/22 128/74  08/10/22 (!) 141/87  04/17/22 134/80   Wt Readings from Last 3 Encounters:  10/22/22 192 lb 12.8 oz (87.5 kg)  04/17/22 206 lb (93.4 kg)  10/16/21 192 lb (87.1  kg)   Body mass index is 32.08 kg/m.    Physical Exam Constitutional:      General: She is not in acute distress.    Appearance: Normal appearance.  HENT:     Head: Normocephalic and atraumatic.  Eyes:     Conjunctiva/sclera: Conjunctivae normal.  Cardiovascular:     Rate and Rhythm: Normal rate and regular rhythm.     Heart sounds: Normal heart sounds. No murmur heard. Pulmonary:     Effort: Pulmonary effort is normal. No respiratory distress.     Breath sounds: Normal breath sounds. No wheezing.  Musculoskeletal:     Cervical back: Neck supple.     Right lower leg: No edema.     Left lower leg: No edema.  Lymphadenopathy:     Cervical: No cervical adenopathy.  Skin:    General: Skin is warm and dry.     Findings: No rash.  Neurological:     Mental Status:  She is alert. Mental status is at baseline.  Psychiatric:        Mood and Affect: Mood normal.        Behavior: Behavior normal.        Lab Results  Component Value Date   WBC 7.5 04/13/2021   HGB 12.8 04/13/2021   HCT 39.2 04/13/2021   PLT 243.0 04/13/2021   GLUCOSE 99 04/17/2022   CHOL 178 04/17/2022   TRIG 80.0 04/17/2022   HDL 110.30 04/17/2022   LDLDIRECT 128.6 09/02/2013   LDLCALC 52 04/17/2022   ALT 37 (H) 04/17/2022   AST 26 04/17/2022   NA 140 04/17/2022   K 4.3 04/17/2022   CL 102 04/17/2022   CREATININE 0.84 04/17/2022   BUN 16 04/17/2022   CO2 31 04/17/2022   TSH 1.83 04/13/2021   HGBA1C 6.9 (H) 04/17/2022   MICROALBUR 5.1 (H) 04/17/2022     Assessment & Plan:    See Problem List for Assessment and Plan of chronic medical problems.

## 2022-10-21 NOTE — Patient Instructions (Addendum)
      Blood work was ordered.   The lab is on the first floor.    Medications changes include :   none     Return in about 6 months (around 04/22/2023) for Physical Exam.

## 2022-10-22 ENCOUNTER — Ambulatory Visit (INDEPENDENT_AMBULATORY_CARE_PROVIDER_SITE_OTHER): Payer: BC Managed Care – PPO | Admitting: Internal Medicine

## 2022-10-22 VITALS — BP 128/74 | HR 96 | Temp 98.2°F | Ht 65.0 in | Wt 192.8 lb

## 2022-10-22 DIAGNOSIS — G479 Sleep disorder, unspecified: Secondary | ICD-10-CM

## 2022-10-22 DIAGNOSIS — E119 Type 2 diabetes mellitus without complications: Secondary | ICD-10-CM | POA: Diagnosis not present

## 2022-10-22 DIAGNOSIS — E669 Obesity, unspecified: Secondary | ICD-10-CM

## 2022-10-22 DIAGNOSIS — F411 Generalized anxiety disorder: Secondary | ICD-10-CM

## 2022-10-22 DIAGNOSIS — E782 Mixed hyperlipidemia: Secondary | ICD-10-CM | POA: Diagnosis not present

## 2022-10-22 DIAGNOSIS — J4520 Mild intermittent asthma, uncomplicated: Secondary | ICD-10-CM | POA: Diagnosis not present

## 2022-10-22 DIAGNOSIS — K219 Gastro-esophageal reflux disease without esophagitis: Secondary | ICD-10-CM | POA: Diagnosis not present

## 2022-10-22 DIAGNOSIS — I10 Essential (primary) hypertension: Secondary | ICD-10-CM

## 2022-10-22 LAB — COMPREHENSIVE METABOLIC PANEL
ALT: 18 U/L (ref 0–35)
AST: 18 U/L (ref 0–37)
Albumin: 4.5 g/dL (ref 3.5–5.2)
Alkaline Phosphatase: 61 U/L (ref 39–117)
BUN: 12 mg/dL (ref 6–23)
CO2: 31 mEq/L (ref 19–32)
Calcium: 9.8 mg/dL (ref 8.4–10.5)
Chloride: 102 mEq/L (ref 96–112)
Creatinine, Ser: 0.95 mg/dL (ref 0.40–1.20)
GFR: 64.92 mL/min (ref >60.00)
Glucose, Bld: 94 mg/dL (ref 70–99)
Potassium: 4 mEq/L (ref 3.5–5.1)
Sodium: 140 mEq/L (ref 135–145)
Total Bilirubin: 0.3 mg/dL (ref 0.2–1.2)
Total Protein: 7.4 g/dL (ref 6.0–8.3)

## 2022-10-22 LAB — LIPID PANEL
Cholesterol: 170 mg/dL (ref 0–200)
HDL: 67 mg/dL (ref >39.00)
LDL Cholesterol: 69 mg/dL (ref 0–99)
NonHDL: 102.79
Total CHOL/HDL Ratio: 3
Triglycerides: 170 mg/dL — ABNORMAL HIGH (ref 0.0–149.0)
VLDL: 34 mg/dL (ref 0.0–40.0)

## 2022-10-22 LAB — HEMOGLOBIN A1C: Hgb A1c MFr Bld: 6.6 % — ABNORMAL HIGH (ref 4.6–6.5)

## 2022-10-22 NOTE — Assessment & Plan Note (Signed)
Chronic GERD controlled Continue omeprazole 40 mg daily 

## 2022-10-22 NOTE — Assessment & Plan Note (Signed)
Chronic Mild, intermittent Controlled Continue singular 10 mg nightly, Advair 250-50 twice daily, albuterol as needed

## 2022-10-22 NOTE — Assessment & Plan Note (Signed)
Chronic Controlled, Stable Continue amitriptyline 25 mg nightly, buspirone 5 mg 2-3 times daily

## 2022-10-22 NOTE — Assessment & Plan Note (Signed)
Chronic Blood pressure well controlled CMP Continue lisinopril 10 mg daily

## 2022-10-22 NOTE — Assessment & Plan Note (Signed)
Chronic Controlled, Stable Continue trazodone 150 mcg daily

## 2022-10-22 NOTE — Assessment & Plan Note (Signed)
Chronic Working on weight loss Stressed regular exercise On Mounjaro for sugars and hopefully this will help her weight Stressed healthy eating-diabetic diet, small portions

## 2022-10-22 NOTE — Assessment & Plan Note (Signed)
Chronic   Lab Results  Component Value Date   HGBA1C 6.9 (H) 04/17/2022   Sugars  controlled Check A1c Continue Mounjaro 2.5 mg weekly-we will titrate as tolerated-started since Ozempic is not available Stressed regular exercise, diabetic diet

## 2022-10-22 NOTE — Assessment & Plan Note (Signed)
Chronic Regular exercise and healthy diet encouraged Check lipid panel  Continue pravastatin 10 mg daily

## 2022-11-11 ENCOUNTER — Other Ambulatory Visit: Payer: Self-pay | Admitting: Internal Medicine

## 2022-11-15 ENCOUNTER — Other Ambulatory Visit: Payer: Self-pay | Admitting: Internal Medicine

## 2022-11-20 NOTE — Progress Notes (Signed)
Subjective:    Patient ID: Morgan Andrews, female    DOB: 04-Feb-1961, 61 y.o.   MRN: [id number]      HPI Morgan Andrews is here for  Chief Complaint  Patient presents with   Nasal Congestion    Cold symptoms, achy, headaches, nasal congestion, took a covid yesterday and was negative.   She is here for an acute visit for cold symptoms.   Her symptoms started last friday  She is experiencing fatigue, fever, congestion, PND, cough - mild and dry, wheeze, body aches, dizziness and headaches. No SOB.    She has tried taking adviar twice a day - has not needed albuterol.  Taking sinuglair.     Covid test neg.        Medications and allergies reviewed with patient and updated if appropriate.  Current Outpatient Medications on File Prior to Visit  Medication Sig Dispense Refill   ADVAIR DISKUS 250-50 MCG/ACT AEPB INHALE 1-2 PUFFS INTO THE LUNGS IN THE MORNING AND AT BEDTIME. 60 each 5   albuterol (VENTOLIN HFA) 108 (90 Base) MCG/ACT inhaler INHALE 2 PUFFS BY MOUTH EVERY 6 HOURS AS NEEDED FOR WHEEZING OR SHORTNESSOF BREATH 18 each 5   amitriptyline (ELAVIL) 25 MG tablet TAKE 1 TO 3 TABLETS BY MOUTH AT BEDTIME 270 tablet 1   busPIRone (BUSPAR) 5 MG tablet Take 1 tablet (5 mg total) by mouth 3 (three) times daily. 90 tablet 5   estradiol (ESTRACE VAGINAL) 0.1 MG/GM vaginal cream Place 1 Applicatorful vaginally 3 (three) times a week. 42.5 g 12   Fluticasone Propionate, Inhal, 250 MCG/ACT AEPB Inhale 1-2 puffs into the lungs in the morning and at bedtime. 28 each 5   ipratropium-albuterol (DUONEB) 0.5-2.5 (3) MG/3ML SOLN Take 3 mLs by nebulization as needed. 360 mL 2   lisinopril (ZESTRIL) 10 MG tablet TAKE 1 TABLET BY MOUTH EVERY DAY 90 tablet 1   montelukast (SINGULAIR) 10 MG tablet TAKE 1 TABLET BY MOUTH EVERY DAY 90 tablet 2   omeprazole (PRILOSEC) 40 MG capsule TAKE 1 CAPSULE (40 MG TOTAL) BY MOUTH DAILY. 90 capsule 2   pravastatin (PRAVACHOL) 10 MG tablet TAKE 1 TABLET BY MOUTH  EVERY DAY 90 tablet 2   tirzepatide (MOUNJARO) 2.5 MG/0.5ML Pen INJECT 2.5 MG SUBCUTANEOUSLY WEEKLY 2 mL 0   tiZANidine (ZANAFLEX) 4 MG tablet Take 1 tablet (4 mg total) by mouth at bedtime. 30 tablet 0   traZODone (DESYREL) 150 MG tablet TAKE 1 TABLET BY MOUTH AT BEDTIME. 90 tablet 1   triamcinolone cream (KENALOG) 0.1 % SMARTSIG:sparingly Topical Twice Daily     ibuprofen (ADVIL) 800 MG tablet Take 800 mg by mouth every 6 (six) hours.     No current facility-administered medications on file prior to visit.    Review of Systems  Constitutional:  Positive for fatigue and fever (a few days ago).  HENT:  Positive for congestion and postnasal drip. Negative for ear pain (fullness in ears), sinus pressure, sinus pain and sore throat.   Respiratory:  Positive for cough (little - dry) and wheezing. Negative for shortness of breath. Stridor: little.  Gastrointestinal:  Negative for diarrhea and nausea.  Musculoskeletal:  Positive for myalgias.  Neurological:  Positive for dizziness (occ) and headaches (intermittent). Negative for light-headedness.       Objective:   Vitals:   11/21/22 1457  BP: 120/68  Pulse: 82  Temp: 98.8 F (37.1 C)  SpO2: 96%   BP Readings from Last 3 Encounters:  11/21/22 120/68  10/22/22 128/74  08/10/22 (!) 141/87   Wt Readings from Last 3 Encounters:  11/21/22 188 lb 6.4 oz (85.5 kg)  10/22/22 192 lb 12.8 oz (87.5 kg)  04/17/22 206 lb (93.4 kg)   Body mass index is 31.35 kg/m.    Physical Exam Constitutional:      General: She is not in acute distress.    Appearance: Normal appearance. She is not ill-appearing.  HENT:     Head: Normocephalic and atraumatic.     Right Ear: Tympanic membrane, ear canal and external ear normal.     Left Ear: Tympanic membrane, ear canal and external ear normal.     Mouth/Throat:     Mouth: Mucous membranes are moist.     Pharynx: No oropharyngeal exudate or posterior oropharyngeal erythema.  Eyes:      Conjunctiva/sclera: Conjunctivae normal.  Cardiovascular:     Rate and Rhythm: Normal rate and regular rhythm.  Pulmonary:     Effort: Pulmonary effort is normal. No respiratory distress.     Breath sounds: Normal breath sounds. No wheezing or rales.  Musculoskeletal:     Cervical back: Neck supple. No tenderness.  Lymphadenopathy:     Cervical: No cervical adenopathy.  Skin:    General: Skin is warm and dry.  Neurological:     Mental Status: She is alert.            Assessment & Plan:    See Problem List for Assessment and Plan of chronic medical problems.

## 2022-11-21 ENCOUNTER — Ambulatory Visit (INDEPENDENT_AMBULATORY_CARE_PROVIDER_SITE_OTHER): Payer: BC Managed Care – PPO | Admitting: Internal Medicine

## 2022-11-21 ENCOUNTER — Encounter: Payer: Self-pay | Admitting: Internal Medicine

## 2022-11-21 ENCOUNTER — Other Ambulatory Visit: Payer: Self-pay | Admitting: Internal Medicine

## 2022-11-21 VITALS — BP 120/68 | HR 82 | Temp 98.8°F | Ht 65.0 in | Wt 188.4 lb

## 2022-11-21 DIAGNOSIS — J069 Acute upper respiratory infection, unspecified: Secondary | ICD-10-CM | POA: Insufficient documentation

## 2022-11-21 MED ORDER — ALBUTEROL SULFATE HFA 108 (90 BASE) MCG/ACT IN AERS: INHALATION_SPRAY | RESPIRATORY_TRACT | 5 refills | Status: DC

## 2022-11-21 MED ORDER — HYDROCOD POLI-CHLORPHE POLI ER 10-8 MG/5ML PO SUER: 5.0000 mL | Freq: Two times a day (BID) | ORAL | 0 refills | Status: DC | PRN

## 2022-11-21 MED ORDER — TIRZEPATIDE 5 MG/0.5ML ~~LOC~~ SOAJ: 5.0000 mg | SUBCUTANEOUS | 0 refills | Status: DC

## 2022-11-21 NOTE — Assessment & Plan Note (Signed)
Acute Symptoms likely viral in nature Covid test is negative  Continue inhalers Continue symptomatic treatment with over-the-counter cold medications Tussionex cough syrup Tylenol/ibuprofen Increase rest and fluids Call if symptoms worsen or do not improve

## 2022-11-21 NOTE — Patient Instructions (Addendum)
      Medications changes include :   tussionex cough syrup     Return if symptoms worsen or fail to improve.

## 2022-11-23 ENCOUNTER — Encounter: Payer: Self-pay | Admitting: Internal Medicine

## 2022-12-05 ENCOUNTER — Encounter: Payer: Self-pay | Admitting: Internal Medicine

## 2022-12-07 ENCOUNTER — Ambulatory Visit (INDEPENDENT_AMBULATORY_CARE_PROVIDER_SITE_OTHER): Payer: BC Managed Care – PPO

## 2022-12-07 ENCOUNTER — Encounter: Payer: Self-pay | Admitting: Internal Medicine

## 2022-12-07 ENCOUNTER — Ambulatory Visit (INDEPENDENT_AMBULATORY_CARE_PROVIDER_SITE_OTHER): Payer: BC Managed Care – PPO | Admitting: Internal Medicine

## 2022-12-07 VITALS — BP 120/70 | HR 60 | Temp 98.0°F | Ht 65.0 in | Wt 184.4 lb

## 2022-12-07 DIAGNOSIS — R052 Subacute cough: Secondary | ICD-10-CM

## 2022-12-07 DIAGNOSIS — J0100 Acute maxillary sinusitis, unspecified: Secondary | ICD-10-CM

## 2022-12-07 DIAGNOSIS — H1032 Unspecified acute conjunctivitis, left eye: Secondary | ICD-10-CM

## 2022-12-07 DIAGNOSIS — R059 Cough, unspecified: Secondary | ICD-10-CM | POA: Diagnosis not present

## 2022-12-07 MED ORDER — NEOMYCIN-POLYMYXIN-DEXAMETH 3.5-10000-0.1 OP SUSP: 2.0000 [drp] | Freq: Four times a day (QID) | OPHTHALMIC | 0 refills | Status: DC

## 2022-12-07 MED ORDER — HYDROCOD POLI-CHLORPHE POLI ER 10-8 MG/5ML PO SUER: 5.0000 mL | Freq: Two times a day (BID) | ORAL | 0 refills | Status: DC | PRN

## 2022-12-07 MED ORDER — AMOXICILLIN-POT CLAVULANATE 875-125 MG PO TABS: 1.0000 | ORAL_TABLET | Freq: Two times a day (BID) | ORAL | 0 refills | Status: DC

## 2022-12-07 NOTE — Progress Notes (Signed)
Subjective:  Patient ID: Morgan Andrews, female    DOB: January 18, 1961  Age: 61 y.o. MRN: [id number]  CC: URI   HPI Morgan Andrews presents for f/up -  She complains of a 3-week history of nonproductive cough, night sweats, thick yellow-green nasal phlegm, itching/redness/drainage from the left eye, and postnasal drip.  Outpatient Medications Prior to Visit  Medication Sig Dispense Refill   ADVAIR DISKUS 250-50 MCG/ACT AEPB INHALE 1-2 PUFFS INTO THE LUNGS IN THE MORNING AND AT BEDTIME. 60 each 5   amitriptyline (ELAVIL) 25 MG tablet TAKE 1 TO 3 TABLETS BY MOUTH AT BEDTIME 270 tablet 1   busPIRone (BUSPAR) 5 MG tablet Take 1 tablet (5 mg total) by mouth 3 (three) times daily. 90 tablet 5   estradiol (ESTRACE VAGINAL) 0.1 MG/GM vaginal cream Place 1 Applicatorful vaginally 3 (three) times a week. 42.5 g 12   Fluticasone Propionate, Inhal, 250 MCG/ACT AEPB Inhale 1-2 puffs into the lungs in the morning and at bedtime. 28 each 5   ipratropium-albuterol (DUONEB) 0.5-2.5 (3) MG/3ML SOLN Take 3 mLs by nebulization as needed. 360 mL 2   levalbuterol (XOPENEX HFA) 45 MCG/ACT inhaler Inhale 1-2 puffs into the lungs every 6 (six) hours as needed for wheezing. 15 g 11   lisinopril (ZESTRIL) 10 MG tablet TAKE 1 TABLET BY MOUTH EVERY DAY 90 tablet 1   montelukast (SINGULAIR) 10 MG tablet TAKE 1 TABLET BY MOUTH EVERY DAY 90 tablet 2   omeprazole (PRILOSEC) 40 MG capsule TAKE 1 CAPSULE (40 MG TOTAL) BY MOUTH DAILY. 90 capsule 2   pravastatin (PRAVACHOL) 10 MG tablet TAKE 1 TABLET BY MOUTH EVERY DAY 90 tablet 2   tirzepatide (MOUNJARO) 5 MG/0.5ML Pen Inject 5 mg into the skin once a week. 2 mL 0   tiZANidine (ZANAFLEX) 4 MG tablet Take 1 tablet (4 mg total) by mouth at bedtime. 30 tablet 0   traZODone (DESYREL) 150 MG tablet TAKE 1 TABLET BY MOUTH AT BEDTIME. 90 tablet 1   triamcinolone cream (KENALOG) 0.1 % SMARTSIG:sparingly Topical Twice Daily     chlorpheniramine-HYDROcodone (TUSSIONEX) 10-8 MG/5ML  Take 5 mLs by mouth every 12 (twelve) hours as needed. 115 mL 0   No facility-administered medications prior to visit.    ROS Review of Systems  Constitutional: Negative.  Negative for appetite change, chills, diaphoresis, fatigue, fever and unexpected weight change.  HENT:  Positive for postnasal drip, rhinorrhea, sinus pressure and sinus pain. Negative for nosebleeds and sore throat.   Eyes:  Positive for discharge and itching. Negative for photophobia, pain, redness and visual disturbance.  Respiratory:  Positive for cough and wheezing. Negative for chest tightness and shortness of breath.   Cardiovascular:  Negative for chest pain, palpitations and leg swelling.  Gastrointestinal:  Negative for abdominal pain, constipation, diarrhea, nausea and vomiting.  Genitourinary: Negative.  Negative for difficulty urinating.  Musculoskeletal: Negative.   Skin: Negative.   Neurological: Negative.  Negative for dizziness and weakness.  Hematological:  Negative for adenopathy. Does not bruise/bleed easily.  Psychiatric/Behavioral: Negative.      Objective:  BP 120/70 (BP Location: Left Arm, Patient Position: Sitting, Cuff Size: Large)   Pulse 60   Temp 98 F (36.7 C) (Oral)   Ht 5\' 5"  (1.651 m)   Wt 184 lb 6.4 oz (83.6 kg)   SpO2 96%   BMI 30.69 kg/m   BP Readings from Last 3 Encounters:  12/07/22 120/70  11/21/22 120/68  10/22/22 128/74    Wt  Readings from Last 3 Encounters:  12/07/22 184 lb 6.4 oz (83.6 kg)  11/21/22 188 lb 6.4 oz (85.5 kg)  10/22/22 192 lb 12.8 oz (87.5 kg)    Physical Exam Vitals reviewed.  Constitutional:      Appearance: She is not ill-appearing.  HENT:     Nose: Nose normal.     Mouth/Throat:     Mouth: Mucous membranes are moist.  Eyes:     General: No scleral icterus.    Pupils: Pupils are equal, round, and reactive to light.  Cardiovascular:     Rate and Rhythm: Normal rate and regular rhythm.     Heart sounds: No murmur heard.    No  friction rub.  Pulmonary:     Effort: Pulmonary effort is normal. No tachypnea.     Breath sounds: Normal air entry. Examination of the right-middle field reveals rhonchi. Examination of the left-middle field reveals rhonchi. Rhonchi present. No decreased breath sounds, wheezing or rales.  Abdominal:     General: Abdomen is flat.     Palpations: There is no mass.     Tenderness: There is no abdominal tenderness. There is no guarding or rebound.     Hernia: No hernia is present.  Musculoskeletal:        General: Normal range of motion.     Cervical back: Neck supple.     Right lower leg: No edema.     Left lower leg: No edema.  Lymphadenopathy:     Cervical: No cervical adenopathy.  Skin:    General: Skin is warm and dry.     Findings: No rash.  Neurological:     General: No focal deficit present.     Mental Status: She is alert. Mental status is at baseline.  Psychiatric:        Mood and Affect: Mood normal.        Behavior: Behavior normal.     Lab Results  Component Value Date   WBC 7.5 04/13/2021   HGB 12.8 04/13/2021   HCT 39.2 04/13/2021   PLT 243.0 04/13/2021   GLUCOSE 94 10/22/2022   CHOL 170 10/22/2022   TRIG 170.0 (H) 10/22/2022   HDL 67.00 10/22/2022   LDLDIRECT 128.6 09/02/2013   LDLCALC 69 10/22/2022   ALT 18 10/22/2022   AST 18 10/22/2022   NA 140 10/22/2022   K 4.0 10/22/2022   CL 102 10/22/2022   CREATININE 0.95 10/22/2022   BUN 12 10/22/2022   CO2 31 10/22/2022   TSH 1.83 04/13/2021   HGBA1C 6.6 (H) 10/22/2022   MICROALBUR 5.1 (H) 04/17/2022    DG Hand Complete Left  Result Date: 08/10/2022 CLINICAL DATA:  Left shoulder and hand pain. Fell yesterday. Pain and swelling. EXAM: LEFT HAND - COMPLETE 3+ VIEW COMPARISON:  None Available. FINDINGS: There is no evidence of fracture or dislocation. Mild interphalangeal joint space narrowing with spurring. Soft tissues are unremarkable. IMPRESSION: 1. No acute fracture or dislocation. 2. Mild osteoarthritis  involving the interphalangeal joints. Electronically Signed   By: Larose Hires D.O.   On: 08/10/2022 13:09   DG Shoulder Left  Result Date: 08/10/2022 CLINICAL DATA:  Left shoulder and hand pain. EXAM: LEFT SHOULDER - 2+ VIEW COMPARISON:  None Available. FINDINGS: There is no evidence of fracture or dislocation. Glenohumeral joint space narrowing with marginal osteophytes. Mild acromioclavicular osteoarthritis with osteolysis of the distal clavicle. High-riding humeral head suggesting rotator cuff arthropathy. IMPRESSION: 1.  No acute fracture or dislocation. 2.  Mild glenohumeral and acromioclavicular osteoarthritis. 3. High-riding humeral head concerning for rotator cuff arthropathy. MRI examination could be considered for further evaluation if clinically warranted. Electronically Signed   By: Larose Hires D.O.   On: 08/10/2022 13:06    DG Chest 2 View  Result Date: 12/07/2022 CLINICAL DATA:  Cough and congestion EXAM: CHEST - 2 VIEW COMPARISON:  None Available. FINDINGS: Normal mediastinum and cardiac silhouette. Normal pulmonary vasculature. No evidence of effusion, infiltrate, or pneumothorax. No acute bony abnormality. Anterior cervical fusion IMPRESSION: No acute cardiopulmonary process. Electronically Signed   By: Genevive Bi M.D.   On: 12/07/2022 11:19     Assessment & Plan:   Morgan Andrews was seen today for uri.  Diagnoses and all orders for this visit:  Subacute cough- Chest x-ray is negative for mass or infiltrate. -     DG Chest 2 View; Future -     chlorpheniramine-HYDROcodone (TUSSIONEX) 10-8 MG/5ML; Take 5 mLs by mouth every 12 (twelve) hours as needed.  Acute bacterial conjunctivitis of left eye -     neomycin-polymyxin b-dexamethasone (MAXITROL) 3.5-10000-0.1 SUSP; Place 2 drops into both eyes every 6 (six) hours.  Acute non-recurrent maxillary sinusitis -     amoxicillin-clavulanate (AUGMENTIN) 875-125 MG tablet; Take 1 tablet by mouth 2 (two) times daily. -      chlorpheniramine-HYDROcodone (TUSSIONEX) 10-8 MG/5ML; Take 5 mLs by mouth every 12 (twelve) hours as needed.   I am having Morgan Andrews start on neomycin-polymyxin b-dexamethasone and amoxicillin-clavulanate. I am also having her maintain her busPIRone, estradiol, ipratropium-albuterol, Fluticasone Propionate (Inhal), tiZANidine, traZODone, lisinopril, amitriptyline, Advair Diskus, triamcinolone cream, montelukast, pravastatin, omeprazole, tirzepatide, levalbuterol, and chlorpheniramine-HYDROcodone.  Meds ordered this encounter  Medications   neomycin-polymyxin b-dexamethasone (MAXITROL) 3.5-10000-0.1 SUSP    Sig: Place 2 drops into both eyes every 6 (six) hours.    Dispense:  5 mL    Refill:  0   amoxicillin-clavulanate (AUGMENTIN) 875-125 MG tablet    Sig: Take 1 tablet by mouth 2 (two) times daily.    Dispense:  20 tablet    Refill:  0   chlorpheniramine-HYDROcodone (TUSSIONEX) 10-8 MG/5ML    Sig: Take 5 mLs by mouth every 12 (twelve) hours as needed.    Dispense:  115 mL    Refill:  0     Follow-up: No follow-ups on file.  Sanda Linger, MD

## 2022-12-07 NOTE — Patient Instructions (Signed)

## 2022-12-11 ENCOUNTER — Ambulatory Visit: Payer: BC Managed Care – PPO | Admitting: Internal Medicine

## 2022-12-12 ENCOUNTER — Encounter: Payer: Self-pay | Admitting: Internal Medicine

## 2022-12-14 ENCOUNTER — Encounter: Payer: Self-pay | Admitting: Internal Medicine

## 2022-12-14 ENCOUNTER — Ambulatory Visit (INDEPENDENT_AMBULATORY_CARE_PROVIDER_SITE_OTHER): Payer: BC Managed Care – PPO | Admitting: Internal Medicine

## 2022-12-14 VITALS — BP 122/78 | HR 85 | Temp 98.9°F | Ht 65.0 in | Wt 184.0 lb

## 2022-12-14 DIAGNOSIS — R1012 Left upper quadrant pain: Secondary | ICD-10-CM

## 2022-12-14 DIAGNOSIS — K853 Drug induced acute pancreatitis without necrosis or infection: Secondary | ICD-10-CM

## 2022-12-14 LAB — CBC WITH DIFFERENTIAL/PLATELET
Basophils Absolute: 0.1 10*3/uL (ref 0.0–0.1)
Basophils Relative: 1.5 % (ref 0.0–3.0)
Eosinophils Absolute: 0.7 10*3/uL (ref 0.0–0.7)
Eosinophils Relative: 8.7 % — ABNORMAL HIGH (ref 0.0–5.0)
HCT: 38.3 % (ref 36.0–46.0)
Hemoglobin: 12.2 g/dL (ref 12.0–15.0)
Lymphocytes Relative: 31.2 % (ref 12.0–46.0)
Lymphs Abs: 2.5 10*3/uL (ref 0.7–4.0)
MCHC: 31.9 g/dL (ref 30.0–36.0)
MCV: 85.1 fl (ref 78.0–100.0)
Monocytes Absolute: 0.7 10*3/uL (ref 0.1–1.0)
Monocytes Relative: 8.1 % (ref 3.0–12.0)
Neutro Abs: 4.1 10*3/uL (ref 1.4–7.7)
Neutrophils Relative %: 50.5 % (ref 43.0–77.0)
Platelets: 262 10*3/uL (ref 150.0–400.0)
RBC: 4.5 Mil/uL (ref 3.87–5.11)
RDW: 16.5 % — ABNORMAL HIGH (ref 11.5–15.5)
WBC: 8.1 10*3/uL (ref 4.0–10.5)

## 2022-12-14 LAB — LIPASE: Lipase: 33 U/L (ref 11.0–59.0)

## 2022-12-14 LAB — COMPREHENSIVE METABOLIC PANEL
ALT: 20 U/L (ref 0–35)
AST: 25 U/L (ref 0–37)
Albumin: 4.3 g/dL (ref 3.5–5.2)
Alkaline Phosphatase: 58 U/L (ref 39–117)
BUN: 13 mg/dL (ref 6–23)
CO2: 31 mEq/L (ref 19–32)
Calcium: 9.7 mg/dL (ref 8.4–10.5)
Chloride: 102 mEq/L (ref 96–112)
Creatinine, Ser: 0.88 mg/dL (ref 0.40–1.20)
GFR: 71.09 mL/min (ref >60.00)
Glucose, Bld: 103 mg/dL — ABNORMAL HIGH (ref 70–99)
Potassium: 4 mEq/L (ref 3.5–5.1)
Sodium: 140 mEq/L (ref 135–145)
Total Bilirubin: 0.3 mg/dL (ref 0.2–1.2)
Total Protein: 7.3 g/dL (ref 6.0–8.3)

## 2022-12-14 LAB — AMYLASE: Amylase: 37 U/L (ref 27–131)

## 2022-12-14 NOTE — Patient Instructions (Signed)
      Blood work was ordered.   The lab is on the first floor.    Medications changes include :  none     A Ct of your abdomen was ordered.     Someone will call you to schedule an appointment.

## 2022-12-14 NOTE — Assessment & Plan Note (Signed)
Acute Started 12/29 - acute onset Pain in LUQ - radiates to left lower back with n/t in left leg when pain is present Pain is severe at times - may last a couple of hours -  no change with eating.  Worse with position changes Tenderness in LUQ/LMQ and L SI joint area on exam Elements of pancreatitis, elements of msk dz On mounjaro - concern for pancreatitis - ck cbc, cmp, amylase, lipase and ck Ct abdomen stat Depending on above with decide on treatment

## 2022-12-14 NOTE — Progress Notes (Signed)
Subjective:    Patient ID: Morgan Andrews, female    DOB: 02-18-1961, 62 y.o.   MRN: [id number]      HPI Morgan Andrews is here for  Chief Complaint  Patient presents with   Abdominal Pain    Left side started dec 29th like sharp stabbing pain radiates to lower back      Left sided abd pain - pain in LUQ pain that started 12/29 - radiates to left lower back.  It started suddenly when she was in bed.  No rash.  No fever.   Worse with changes in positions, moving.  Laying and curling up helps a little. Not worse with eating.  Tylenol does not help.  Pain is intermittent - about the same as when it started.  The pain is sharp when it comes and is severe - may last a couple of hours. She has N/T in left leg when she has the severe pain.    Last dose of mounjaro was yesterday.    Medications and allergies reviewed with patient and updated if appropriate.  Current Outpatient Medications on File Prior to Visit  Medication Sig Dispense Refill   ADVAIR DISKUS 250-50 MCG/ACT AEPB INHALE 1-2 PUFFS INTO THE LUNGS IN THE MORNING AND AT BEDTIME. 60 each 5   amitriptyline (ELAVIL) 25 MG tablet TAKE 1 TO 3 TABLETS BY MOUTH AT BEDTIME 270 tablet 1   amoxicillin-clavulanate (AUGMENTIN) 875-125 MG tablet Take 1 tablet by mouth 2 (two) times daily. 20 tablet 0   busPIRone (BUSPAR) 5 MG tablet Take 1 tablet (5 mg total) by mouth 3 (three) times daily. 90 tablet 5   chlorpheniramine-HYDROcodone (TUSSIONEX) 10-8 MG/5ML Take 5 mLs by mouth every 12 (twelve) hours as needed. 115 mL 0   estradiol (ESTRACE VAGINAL) 0.1 MG/GM vaginal cream Place 1 Applicatorful vaginally 3 (three) times a week. 42.5 g 12   Fluticasone Propionate, Inhal, 250 MCG/ACT AEPB Inhale 1-2 puffs into the lungs in the morning and at bedtime. 28 each 5   ipratropium-albuterol (DUONEB) 0.5-2.5 (3) MG/3ML SOLN Take 3 mLs by nebulization as needed. 360 mL 2   levalbuterol (XOPENEX HFA) 45 MCG/ACT inhaler Inhale 1-2 puffs into the lungs every 6  (six) hours as needed for wheezing. 15 g 11   lisinopril (ZESTRIL) 10 MG tablet TAKE 1 TABLET BY MOUTH EVERY DAY 90 tablet 1   montelukast (SINGULAIR) 10 MG tablet TAKE 1 TABLET BY MOUTH EVERY DAY 90 tablet 2   neomycin-polymyxin b-dexamethasone (MAXITROL) 3.5-10000-0.1 SUSP Place 2 drops into both eyes every 6 (six) hours. 5 mL 0   omeprazole (PRILOSEC) 40 MG capsule TAKE 1 CAPSULE (40 MG TOTAL) BY MOUTH DAILY. 90 capsule 2   pravastatin (PRAVACHOL) 10 MG tablet TAKE 1 TABLET BY MOUTH EVERY DAY 90 tablet 2   tirzepatide (MOUNJARO) 5 MG/0.5ML Pen Inject 5 mg into the skin once a week. 2 mL 0   tiZANidine (ZANAFLEX) 4 MG tablet Take 1 tablet (4 mg total) by mouth at bedtime. 30 tablet 0   traZODone (DESYREL) 150 MG tablet TAKE 1 TABLET BY MOUTH AT BEDTIME. 90 tablet 1   triamcinolone cream (KENALOG) 0.1 % SMARTSIG:sparingly Topical Twice Daily     No current facility-administered medications on file prior to visit.    Review of Systems  Constitutional:  Negative for fever.  Respiratory:  Positive for cough (residual to recent URI). Negative for shortness of breath and wheezing.   Cardiovascular:  Negative for chest pain and palpitations.  Gastrointestinal:  Positive for abdominal pain and nausea. Negative for blood in stool, constipation and diarrhea.       No gerd  Genitourinary:  Positive for genital sores. Negative for dysuria, frequency, hematuria and urgency.  Skin:  Negative for rash.  Neurological:  Positive for light-headedness (when she gets up too fast). Negative for headaches.       Objective:   Vitals:   12/14/22 1305  BP: 122/78  Pulse: 85  Temp: 98.9 F (37.2 C)  SpO2: 98%   BP Readings from Last 3 Encounters:  12/14/22 122/78  12/07/22 120/70  11/21/22 120/68   Wt Readings from Last 3 Encounters:  12/14/22 184 lb (83.5 kg)  12/07/22 184 lb 6.4 oz (83.6 kg)  11/21/22 188 lb 6.4 oz (85.5 kg)   Body mass index is 30.62 kg/m.    Physical  Exam Constitutional:      General: She is not in acute distress.    Appearance: Normal appearance. She is not ill-appearing.  HENT:     Head: Normocephalic and atraumatic.  Abdominal:     General: There is no distension.     Palpations: Abdomen is soft.     Tenderness: There is abdominal tenderness (LUQ-LMQ). There is no left CVA tenderness, guarding or rebound.     Hernia: No hernia is present.  Musculoskeletal:        General: Tenderness (L lower back, SI joint area) present.     Right lower leg: No edema.     Left lower leg: No edema.  Skin:    General: Skin is warm and dry.     Findings: No rash.  Neurological:     Mental Status: She is alert.     Sensory: No sensory deficit.     Motor: No weakness.            Assessment & Plan:    See Problem List for Assessment and Plan of chronic medical problems.

## 2022-12-17 ENCOUNTER — Encounter: Payer: Self-pay | Admitting: Internal Medicine

## 2022-12-17 ENCOUNTER — Ambulatory Visit
Admission: RE | Admit: 2022-12-17 | Discharge: 2022-12-17 | Disposition: A | Discharge status: Home / Self Care | Payer: BC Managed Care – PPO | Source: Ambulatory Visit | Attending: Internal Medicine | Admitting: Internal Medicine

## 2022-12-17 DIAGNOSIS — K853 Drug induced acute pancreatitis without necrosis or infection: Secondary | ICD-10-CM

## 2022-12-17 DIAGNOSIS — K449 Diaphragmatic hernia without obstruction or gangrene: Secondary | ICD-10-CM | POA: Diagnosis not present

## 2022-12-17 DIAGNOSIS — R1012 Left upper quadrant pain: Secondary | ICD-10-CM

## 2022-12-17 MED ORDER — IOPAMIDOL (ISOVUE-300) INJECTION 61%
100.0000 mL | Freq: Once | INTRAVENOUS | Status: AC | PRN
  Administered 2022-12-17: 100 mL via INTRAVENOUS

## 2022-12-18 ENCOUNTER — Other Ambulatory Visit: Payer: Self-pay

## 2022-12-18 MED ORDER — FLUTICASONE-SALMETEROL 250-50 MCG/ACT IN AEPB
INHALATION_SPRAY | RESPIRATORY_TRACT | 5 refills | Status: DC
Start: 2022-12-18 — End: 2023-08-19

## 2022-12-21 ENCOUNTER — Encounter: Payer: Self-pay | Admitting: Internal Medicine

## 2022-12-21 MED ORDER — CEPHALEXIN 500 MG PO CAPS: 500.0000 mg | ORAL_CAPSULE | Freq: Two times a day (BID) | ORAL | 0 refills | Status: DC

## 2022-12-24 ENCOUNTER — Telehealth: Payer: Self-pay | Admitting: Internal Medicine

## 2022-12-24 ENCOUNTER — Other Ambulatory Visit: Payer: Self-pay

## 2022-12-24 ENCOUNTER — Other Ambulatory Visit: Payer: BC Managed Care – PPO

## 2022-12-24 ENCOUNTER — Other Ambulatory Visit: Payer: Self-pay | Admitting: Internal Medicine

## 2022-12-24 DIAGNOSIS — R300 Dysuria: Secondary | ICD-10-CM

## 2022-12-24 MED ORDER — HYDROCODONE-ACETAMINOPHEN 5-325 MG PO TABS: 1.0000 | ORAL_TABLET | Freq: Four times a day (QID) | ORAL | 0 refills | Status: DC | PRN

## 2022-12-24 NOTE — Telephone Encounter (Signed)
Patient called stating that the pharmacy  [id number] [place], [place] Is out of stock of her medication HYDROcodone-acetaminophen (NORCO/VICODIN) 5-325 MG tablet . [facility] called around and [facility] out [facility] [address] [facility] that medication in stock. Patient is requesting her prescription be sent to that pharmacy instead. Best callback number for patient is [phone].

## 2022-12-24 NOTE — Addendum Note (Signed)
Addended by: Binnie Rail on: 12/24/2022 08:48 AM   Modules accepted: Orders

## 2022-12-26 ENCOUNTER — Encounter: Payer: Self-pay | Admitting: Internal Medicine

## 2022-12-26 DIAGNOSIS — R310 Gross hematuria: Secondary | ICD-10-CM

## 2022-12-26 DIAGNOSIS — R109 Unspecified abdominal pain: Secondary | ICD-10-CM

## 2022-12-26 LAB — CULTURE, URINE COMPREHENSIVE

## 2022-12-28 MED ORDER — TAMSULOSIN HCL 0.4 MG PO CAPS: 0.4000 mg | ORAL_CAPSULE | Freq: Every day | ORAL | 0 refills | Status: DC

## 2023-01-03 DIAGNOSIS — R310 Gross hematuria: Secondary | ICD-10-CM | POA: Diagnosis not present

## 2023-01-03 DIAGNOSIS — D414 Neoplasm of uncertain behavior of bladder: Secondary | ICD-10-CM | POA: Diagnosis not present

## 2023-01-10 ENCOUNTER — Encounter: Payer: Self-pay | Admitting: Internal Medicine

## 2023-01-10 DIAGNOSIS — N200 Calculus of kidney: Secondary | ICD-10-CM | POA: Diagnosis not present

## 2023-01-10 DIAGNOSIS — R310 Gross hematuria: Secondary | ICD-10-CM | POA: Diagnosis not present

## 2023-01-10 NOTE — Progress Notes (Signed)
Subjective:    Patient ID: Morgan Andrews, female    DOB: [birth year], 62 y.o.   MRN: [id number]      HPI Morgan Andrews is here for  Chief Complaint  Patient presents with   Ear Pain    Left ear pain x 4 days; Patient also notes drainage     Left Ear ache, decreased hearing, fluid drainage - symptoms became worse 4 days ago  - she is not sure when they really started.  Has some nasal congestion.   She denies fever, cough, wheeze, shortness of breath, headaches and dizziness.  Medications and allergies reviewed with patient and updated if appropriate.  Current Outpatient Medications on File Prior to Visit  Medication Sig Dispense Refill   amitriptyline (ELAVIL) 25 MG tablet TAKE 1 TO 3 TABLETS BY MOUTH AT BEDTIME 270 tablet 1   busPIRone (BUSPAR) 5 MG tablet Take 1 tablet (5 mg total) by mouth 3 (three) times daily. 90 tablet 5   chlorpheniramine-HYDROcodone (TUSSIONEX) 10-8 MG/5ML Take 5 mLs by mouth every 12 (twelve) hours as needed. 115 mL 0   estradiol (ESTRACE VAGINAL) 0.1 MG/GM vaginal cream Place 1 Applicatorful vaginally 3 (three) times a week. 42.5 g 12   Fluticasone Propionate, Inhal, 250 MCG/ACT AEPB Inhale 1-2 puffs into the lungs in the morning and at bedtime. 28 each 5   fluticasone-salmeterol (ADVAIR DISKUS) 250-50 MCG/ACT AEPB INHALE 1-2 PUFFS INTO THE LUNGS IN THE MORNING AND AT BEDTIME. 60 each 5   HYDROcodone-acetaminophen (NORCO/VICODIN) 5-325 MG tablet Take 1-2 tablets by mouth every 6 (six) hours as needed for moderate pain. 30 tablet 0   ipratropium-albuterol (DUONEB) 0.5-2.5 (3) MG/3ML SOLN Take 3 mLs by nebulization as needed. 360 mL 2   levalbuterol (XOPENEX HFA) 45 MCG/ACT inhaler Inhale 1-2 puffs into the lungs every 6 (six) hours as needed for wheezing. 15 g 11   lisinopril (ZESTRIL) 10 MG tablet TAKE 1 TABLET BY MOUTH EVERY DAY 90 tablet 1   montelukast (SINGULAIR) 10 MG tablet TAKE 1 TABLET BY MOUTH EVERY DAY 90 tablet 2   neomycin-polymyxin  b-dexamethasone (MAXITROL) 3.5-10000-0.1 SUSP Place 2 drops into both eyes every 6 (six) hours. 5 mL 0   omeprazole (PRILOSEC) 40 MG capsule TAKE 1 CAPSULE (40 MG TOTAL) BY MOUTH DAILY. 90 capsule 2   pravastatin (PRAVACHOL) 10 MG tablet TAKE 1 TABLET BY MOUTH EVERY DAY 90 tablet 2   tirzepatide (MOUNJARO) 5 MG/0.5ML Pen INJECT 5 MG SUBCUTANEOUSLY WEEKLY 2 mL 0   tiZANidine (ZANAFLEX) 4 MG tablet Take 1 tablet (4 mg total) by mouth at bedtime. 30 tablet 0   traZODone (DESYREL) 150 MG tablet TAKE 1 TABLET BY MOUTH AT BEDTIME. 90 tablet 1   triamcinolone cream (KENALOG) 0.1 % SMARTSIG:sparingly Topical Twice Daily     No current facility-administered medications on file prior to visit.    Review of Systems  Constitutional:  Negative for chills and fever.  HENT:  Positive for congestion, ear discharge, ear pain (left ear) and hearing loss. Negative for sinus pressure, sinus pain and sore throat.   Respiratory:  Negative for cough, shortness of breath and wheezing.   Neurological:  Negative for dizziness and headaches.       Objective:   Vitals:   01/11/23 0841  BP: 114/78  Pulse: 75  Temp: 98.1 F (36.7 C)  SpO2: 95%   BP Readings from Last 3 Encounters:  01/11/23 114/78  12/14/22 122/78  12/07/22 120/70   Wt Readings from  Last 3 Encounters:  01/11/23 180 lb (81.6 kg)  12/14/22 184 lb (83.5 kg)  12/07/22 184 lb 6.4 oz (83.6 kg)   Body mass index is 29.95 kg/m.    Physical Exam Constitutional:      General: She is in acute distress.     Appearance: Normal appearance. She is not ill-appearing.  HENT:     Head: Normocephalic and atraumatic.     Right Ear: Impacted cerumen: excessive cerumen.     Left Ear: There is impacted cerumen.  Skin:    General: Skin is warm and dry.     Findings: No rash.  Neurological:     Mental Status: She is alert.       R TM not visualized due to impacted cerumen but that ear is asymptomatic so no intervention needed   PRE-PROCEDURE  EXAM: left  TM cannot be visualized due to total occlusion/impaction of the ear canal. PROCEDURE INDICATION: remove wax to visualize ear drum & relieve discomfort, improve hearing  CONSENT:  Verbal  PROCEDURE NOTE:   LEFT EAR:  The CMA irrigated the ear canal with warm water to remove the most of the wax.   POST- PROCEDURE EXAM: she did have some discomfort during the procedure and the irrigation was stopped.  50% of the ear wax from the canal was removed successfully.  The TM was still covered with cerumen and not able to be visualized.      Assessment & Plan:    See Problem List for Assessment and Plan of chronic medical problems.

## 2023-01-10 NOTE — Patient Instructions (Addendum)
         Medications changes include :   augmentin    Return if symptoms worsen or fail to improve.

## 2023-01-11 ENCOUNTER — Ambulatory Visit (INDEPENDENT_AMBULATORY_CARE_PROVIDER_SITE_OTHER): Payer: BC Managed Care – PPO | Admitting: Internal Medicine

## 2023-01-11 VITALS — BP 114/78 | HR 75 | Temp 98.1°F | Ht 65.0 in | Wt 180.0 lb

## 2023-01-11 DIAGNOSIS — H6692 Otitis media, unspecified, left ear: Secondary | ICD-10-CM | POA: Insufficient documentation

## 2023-01-11 DIAGNOSIS — H6122 Impacted cerumen, left ear: Secondary | ICD-10-CM | POA: Insufficient documentation

## 2023-01-11 MED ORDER — AMOXICILLIN-POT CLAVULANATE 875-125 MG PO TABS: 1.0000 | ORAL_TABLET | Freq: Two times a day (BID) | ORAL | 0 refills | Status: AC

## 2023-01-11 NOTE — Assessment & Plan Note (Signed)
Acute Left ear canal with impacted cerumen CMA attempted ear lavage and some of the wax was able to get removed, but not all of it-stop because of discomfort Still has wax deep in the ear canal against tympanic membrane She will use over-the-counter earwax removal drops to see if she can remove the remaining cerumen and will let me know if she is not able to-can refer to ENT because she is still having some discomfort

## 2023-01-11 NOTE — Assessment & Plan Note (Signed)
Acute left ear pain She did have impacted cerumen and we were able to remove some of the earwax with ear lavage, but not the wax against the ear from Unable to visualize tympanic membrane Given pain there is some concern for otitis media Start Augmentin twice daily for 7 days Follow-up if pain is not improving

## 2023-01-15 ENCOUNTER — Ambulatory Visit: Payer: BC Managed Care – PPO | Admitting: Internal Medicine

## 2023-01-18 ENCOUNTER — Other Ambulatory Visit: Payer: Self-pay | Admitting: Internal Medicine

## 2023-01-18 ENCOUNTER — Other Ambulatory Visit: Payer: Self-pay

## 2023-01-18 ENCOUNTER — Other Ambulatory Visit: Payer: Self-pay | Admitting: Ophthalmology

## 2023-01-18 DIAGNOSIS — D494 Neoplasm of unspecified behavior of bladder: Secondary | ICD-10-CM | POA: Diagnosis not present

## 2023-01-18 DIAGNOSIS — D414 Neoplasm of uncertain behavior of bladder: Secondary | ICD-10-CM | POA: Diagnosis not present

## 2023-01-18 DIAGNOSIS — D303 Benign neoplasm of bladder: Secondary | ICD-10-CM | POA: Diagnosis not present

## 2023-01-19 ENCOUNTER — Encounter: Payer: Self-pay | Admitting: Internal Medicine

## 2023-01-23 ENCOUNTER — Encounter: Payer: Self-pay | Admitting: Internal Medicine

## 2023-01-23 DIAGNOSIS — H9190 Unspecified hearing loss, unspecified ear: Secondary | ICD-10-CM

## 2023-01-23 DIAGNOSIS — H6120 Impacted cerumen, unspecified ear: Secondary | ICD-10-CM

## 2023-01-24 ENCOUNTER — Other Ambulatory Visit: Payer: Self-pay | Admitting: Internal Medicine

## 2023-01-25 DIAGNOSIS — D414 Neoplasm of uncertain behavior of bladder: Secondary | ICD-10-CM | POA: Diagnosis not present

## 2023-01-25 DIAGNOSIS — R8271 Bacteriuria: Secondary | ICD-10-CM | POA: Diagnosis not present

## 2023-01-25 DIAGNOSIS — R310 Gross hematuria: Secondary | ICD-10-CM | POA: Diagnosis not present

## 2023-01-29 ENCOUNTER — Other Ambulatory Visit: Payer: Self-pay | Admitting: Internal Medicine

## 2023-02-05 ENCOUNTER — Other Ambulatory Visit: Payer: Self-pay | Admitting: Internal Medicine

## 2023-02-13 ENCOUNTER — Other Ambulatory Visit: Payer: Self-pay | Admitting: Internal Medicine

## 2023-02-15 ENCOUNTER — Other Ambulatory Visit: Payer: Self-pay

## 2023-02-15 ENCOUNTER — Other Ambulatory Visit: Payer: Self-pay | Admitting: Internal Medicine

## 2023-02-27 ENCOUNTER — Other Ambulatory Visit: Payer: Self-pay

## 2023-02-27 ENCOUNTER — Encounter: Payer: Self-pay | Admitting: Internal Medicine

## 2023-02-27 MED ORDER — TIRZEPATIDE 7.5 MG/0.5ML ~~LOC~~ SOAJ: 7.5000 mg | SUBCUTANEOUS | 0 refills | Status: DC

## 2023-03-04 DIAGNOSIS — M654 Radial styloid tenosynovitis [de Quervain]: Secondary | ICD-10-CM | POA: Diagnosis not present

## 2023-03-05 ENCOUNTER — Other Ambulatory Visit: Payer: Self-pay

## 2023-03-05 MED ORDER — MOUNJARO 5 MG/0.5ML ~~LOC~~ SOAJ: SUBCUTANEOUS | 0 refills | Status: DC

## 2023-03-10 ENCOUNTER — Other Ambulatory Visit: Payer: Self-pay | Admitting: Internal Medicine

## 2023-03-19 DIAGNOSIS — M654 Radial styloid tenosynovitis [de Quervain]: Secondary | ICD-10-CM | POA: Diagnosis not present

## 2023-03-21 DIAGNOSIS — H43812 Vitreous degeneration, left eye: Secondary | ICD-10-CM | POA: Diagnosis not present

## 2023-03-21 DIAGNOSIS — E119 Type 2 diabetes mellitus without complications: Secondary | ICD-10-CM | POA: Diagnosis not present

## 2023-03-25 DIAGNOSIS — H33312 Horseshoe tear of retina without detachment, left eye: Secondary | ICD-10-CM | POA: Diagnosis not present

## 2023-03-25 DIAGNOSIS — H43392 Other vitreous opacities, left eye: Secondary | ICD-10-CM | POA: Diagnosis not present

## 2023-03-25 DIAGNOSIS — H53142 Visual discomfort, left eye: Secondary | ICD-10-CM | POA: Diagnosis not present

## 2023-03-25 DIAGNOSIS — H31092 Other chorioretinal scars, left eye: Secondary | ICD-10-CM | POA: Diagnosis not present

## 2023-03-27 DIAGNOSIS — M25531 Pain in right wrist: Secondary | ICD-10-CM | POA: Diagnosis not present

## 2023-03-29 DIAGNOSIS — G5601 Carpal tunnel syndrome, right upper limb: Secondary | ICD-10-CM | POA: Diagnosis not present

## 2023-04-04 ENCOUNTER — Other Ambulatory Visit: Payer: Self-pay | Admitting: Internal Medicine

## 2023-04-11 DIAGNOSIS — G5623 Lesion of ulnar nerve, bilateral upper limbs: Secondary | ICD-10-CM | POA: Diagnosis not present

## 2023-04-11 DIAGNOSIS — M654 Radial styloid tenosynovitis [de Quervain]: Secondary | ICD-10-CM | POA: Diagnosis not present

## 2023-04-11 DIAGNOSIS — G5603 Carpal tunnel syndrome, bilateral upper limbs: Secondary | ICD-10-CM | POA: Diagnosis not present

## 2023-04-11 DIAGNOSIS — M25531 Pain in right wrist: Secondary | ICD-10-CM | POA: Diagnosis not present

## 2023-04-17 DIAGNOSIS — H33312 Horseshoe tear of retina without detachment, left eye: Secondary | ICD-10-CM | POA: Diagnosis not present

## 2023-04-18 DIAGNOSIS — M654 Radial styloid tenosynovitis [de Quervain]: Secondary | ICD-10-CM | POA: Diagnosis not present

## 2023-04-18 DIAGNOSIS — G5603 Carpal tunnel syndrome, bilateral upper limbs: Secondary | ICD-10-CM | POA: Diagnosis not present

## 2023-04-23 ENCOUNTER — Encounter: Payer: Self-pay | Admitting: Internal Medicine

## 2023-04-23 NOTE — Progress Notes (Unsigned)
Subjective:    Patient ID: Morgan Andrews, female    DOB: April 01, 1961, 62 y.o.   MRN: [id number]      HPI Morgan Andrews is here for a Physical exam and her chronic medical problems.    Doing well.  Has no major concerns.  She did find that the [facility] is not working quite as well as Ozempic did.  She does not find that her cravings is as suppressed or her appetite is as suppressed.  She is walking her puppy for exercise.  Medications and allergies reviewed with patient and updated if appropriate.  Current Outpatient Medications on File Prior to Visit  Medication Sig Dispense Refill   amitriptyline (ELAVIL) 25 MG tablet TAKE 1 TO 3 TABLETS BY MOUTH AT BEDTIME 270 tablet 1   busPIRone (BUSPAR) 5 MG tablet TAKE 1 TABLET BY MOUTH THREE TIMES A DAY 90 tablet 5   estradiol (ESTRACE VAGINAL) 0.1 MG/GM vaginal cream Place 1 Applicatorful vaginally 3 (three) times a week. 42.5 g 12   Fluticasone Propionate, Inhal, 250 MCG/ACT AEPB Inhale 1-2 puffs into the lungs in the morning and at bedtime. 28 each 5   fluticasone-salmeterol (ADVAIR DISKUS) 250-50 MCG/ACT AEPB INHALE 1-2 PUFFS INTO THE LUNGS IN THE MORNING AND AT BEDTIME. 60 each 5   HYDROcodone-acetaminophen (NORCO/VICODIN) 5-325 MG tablet Take 1-2 tablets by mouth every 6 (six) hours as needed for moderate pain. 30 tablet 0   ipratropium-albuterol (DUONEB) 0.5-2.5 (3) MG/3ML SOLN Take 3 mLs by nebulization as needed. 360 mL 2   levalbuterol (XOPENEX HFA) 45 MCG/ACT inhaler Inhale 1-2 puffs into the lungs every 6 (six) hours as needed for wheezing. 15 g 11   lisinopril (ZESTRIL) 10 MG tablet TAKE 1 TABLET BY MOUTH EVERY DAY 90 tablet 1   montelukast (SINGULAIR) 10 MG tablet TAKE 1 TABLET BY MOUTH EVERY DAY 90 tablet 2   omeprazole (PRILOSEC) 40 MG capsule TAKE 1 CAPSULE (40 MG TOTAL) BY MOUTH DAILY. 90 capsule 2   pravastatin (PRAVACHOL) 10 MG tablet TAKE 1 TABLET BY MOUTH EVERY DAY 90 tablet 2   tiZANidine (ZANAFLEX) 4 MG tablet Take 1 tablet  (4 mg total) by mouth at bedtime. 30 tablet 0   traZODone (DESYREL) 150 MG tablet TAKE 1 TABLET BY MOUTH EVERYDAY AT BEDTIME 90 tablet 2   triamcinolone cream (KENALOG) 0.1 % SMARTSIG:sparingly Topical Twice Daily     No current facility-administered medications on file prior to visit.    Review of Systems  Constitutional:  Negative for fever.  Eyes:  Negative for visual disturbance.  Respiratory:  Negative for cough, shortness of breath and wheezing.   Cardiovascular:  Negative for chest pain, palpitations and leg swelling.  Gastrointestinal:  Negative for abdominal pain, blood in stool, constipation and diarrhea.       Occ gerd  Genitourinary:  Negative for dysuria.  Musculoskeletal:  Negative for arthralgias and back pain.  Skin:  Negative for rash.  Neurological:  Negative for light-headedness and headaches.  Psychiatric/Behavioral:  Negative for dysphoric mood. The patient is not nervous/anxious.        Objective:   Vitals:   04/24/23 1044  BP: 114/68  Pulse: 80  Temp: 98.2 F (36.8 C)  SpO2: 98%   Filed Weights   04/24/23 1044  Weight: 184 lb (83.5 kg)   Body mass index is 30.62 kg/m.  BP Readings from Last 3 Encounters:  04/24/23 114/68  01/11/23 114/78  12/14/22 122/78    Wt Readings from  Last 3 Encounters:  04/24/23 184 lb (83.5 kg)  01/11/23 180 lb (81.6 kg)  12/14/22 184 lb (83.5 kg)       Physical Exam Constitutional: She appears well-developed and well-nourished. No distress.  HENT:  Head: Normocephalic and atraumatic.  Right Ear: External ear normal. Normal ear canal and TM Left Ear: External ear normal.  Normal ear canal and TM Mouth/Throat: Oropharynx is clear and moist.  Eyes: Conjunctivae normal.  Neck: Neck supple. No tracheal deviation present. No thyromegaly present.  No carotid bruit  Cardiovascular: Normal rate, regular rhythm and normal heart sounds.   No murmur heard.  No edema. Pulmonary/Chest: Effort normal and breath sounds  normal. No respiratory distress. She has no wheezes. She has no rales.  Breast: deferred   Abdominal: Soft. She exhibits no distension. There is no tenderness.  Lymphadenopathy: She has no cervical adenopathy.  Skin: Skin is warm and dry. She is not diaphoretic.  Psychiatric: She has a normal mood and affect. Her behavior is normal.    Diabetic Foot Exam - Simple   Simple Foot Form Diabetic Foot exam was performed with the following findings: Yes 04/24/2023 11:23 AM  Visual Inspection No deformities, no ulcerations, no other skin breakdown bilaterally: Yes Sensation Testing Intact to touch and monofilament testing bilaterally: Yes Pulse Check Posterior Tibialis and Dorsalis pulse intact bilaterally: Yes Comments       Lab Results  Component Value Date   WBC 8.1 12/14/2022   HGB 12.2 12/14/2022   HCT 38.3 12/14/2022   PLT 262.0 12/14/2022   GLUCOSE 103 (H) 12/14/2022   CHOL 170 10/22/2022   TRIG 170.0 (H) 10/22/2022   HDL 67.00 10/22/2022   LDLDIRECT 128.6 09/02/2013   LDLCALC 69 10/22/2022   ALT 20 12/14/2022   AST 25 12/14/2022   NA 140 12/14/2022   K 4.0 12/14/2022   CL 102 12/14/2022   CREATININE 0.88 12/14/2022   BUN 13 12/14/2022   CO2 31 12/14/2022   TSH 1.83 04/13/2021   HGBA1C 6.6 (H) 10/22/2022   MICROALBUR 5.1 (H) 04/17/2022         Assessment & Plan:   Physical exam: Screening blood work  ordered Exercise  walking puppy Weight  working on weight loss Substance abuse  none   Reviewed recommended immunizations.   Health Maintenance  Topic Date Due   OPHTHALMOLOGY EXAM  10/05/2015   Diabetic kidney evaluation - Urine ACR  04/18/2023   HEMOGLOBIN A1C  04/22/2023   COVID-19 Vaccine (6 - 2023-24 season) 05/10/2023 (Originally 09/14/2022)   INFLUENZA VACCINE  07/11/2023   COLONOSCOPY (Pts 45-28yrs Insurance coverage will need to be confirmed)  11/27/2023   Diabetic kidney evaluation - eGFR measurement  12/15/2023   FOOT EXAM  04/23/2024    MAMMOGRAM  05/18/2024   DTaP/Tdap/Td (3 - Td or Tdap) 06/09/2028   Zoster Vaccines- Shingrix  Completed   HPV VACCINES  Aged Out   Hepatitis C Screening  Discontinued   HIV Screening  Discontinued          See Problem List for Assessment and Plan of chronic medical problems.

## 2023-04-23 NOTE — Patient Instructions (Addendum)
Blood work was ordered.   The lab is on the first floor.    Medications changes include :   mounajro 10 mg weekly      Return in about 6 months (around 10/25/2023) for follow up.    Health Maintenance, Female Adopting a healthy lifestyle and getting preventive care are important in promoting health and wellness. Ask your health care provider about: The right schedule for you to have regular tests and exams. Things you can do on your own to prevent diseases and keep yourself healthy. What should I know about diet, weight, and exercise? Eat a healthy diet  Eat a diet that includes plenty of vegetables, fruits, low-fat dairy products, and lean protein. Do not eat a lot of foods that are high in solid fats, added sugars, or sodium. Maintain a healthy weight Body mass index (BMI) is used to identify weight problems. It estimates body fat based on height and weight. Your health care provider can help determine your BMI and help you achieve or maintain a healthy weight. Get regular exercise Get regular exercise. This is one of the most important things you can do for your health. Most adults should: Exercise for at least 150 minutes each week. The exercise should increase your heart rate and make you sweat (moderate-intensity exercise). Do strengthening exercises at least twice a week. This is in addition to the moderate-intensity exercise. Spend less time sitting. Even light physical activity can be beneficial. Watch cholesterol and blood lipids Have your blood tested for lipids and cholesterol at 62 years of age, then have this test every 5 years. Have your cholesterol levels checked more often if: Your lipid or cholesterol levels are high. You are older than 62 years of age. You are at high risk for heart disease. What should I know about cancer screening? Depending on your health history and family history, you may need to have cancer screening at various ages. This may  include screening for: Breast cancer. Cervical cancer. Colorectal cancer. Skin cancer. Lung cancer. What should I know about heart disease, diabetes, and high blood pressure? Blood pressure and heart disease High blood pressure causes heart disease and increases the risk of stroke. This is more likely to develop in people who have high blood pressure readings or are overweight. Have your blood pressure checked: Every 3-5 years if you are 91-67 years of age. Every year if you are 25 years old or older. Diabetes Have regular diabetes screenings. This checks your fasting blood sugar level. Have the screening done: Once every three years after age 53 if you are at a normal weight and have a low risk for diabetes. More often and at a younger age if you are overweight or have a high risk for diabetes. What should I know about preventing infection? Hepatitis B If you have a higher risk for hepatitis B, you should be screened for this virus. Talk with your health care provider to find out if you are at risk for hepatitis B infection. Hepatitis C Testing is recommended for: Everyone born from [birth year] through 1965. Anyone with known risk factors for hepatitis C. Sexually transmitted infections (STIs) Get screened for STIs, including gonorrhea and chlamydia, if: You are sexually active and are younger than 62 years of age. You are older than 62 years of age and your health care provider tells you that you are at risk for this type of infection. Your sexual activity has changed since you were last  screened, and you are at increased risk for chlamydia or gonorrhea. Ask your health care provider if you are at risk. Ask your health care provider about whether you are at high risk for HIV. Your health care provider may recommend a prescription medicine to help prevent HIV infection. If you choose to take medicine to prevent HIV, you should first get tested for HIV. You should then be tested every 3 months  for as long as you are taking the medicine. Pregnancy If you are about to stop having your period (premenopausal) and you may become pregnant, seek counseling before you get pregnant. Take 400 to 800 micrograms (mcg) of folic acid every day if you become pregnant. Ask for birth control (contraception) if you want to prevent pregnancy. Osteoporosis and menopause Osteoporosis is a disease in which the bones lose minerals and strength with aging. This can result in bone fractures. If you are 51 years old or older, or if you are at risk for osteoporosis and fractures, ask your health care provider if you should: Be screened for bone loss. Take a calcium or vitamin D supplement to lower your risk of fractures. Be given hormone replacement therapy (HRT) to treat symptoms of menopause. Follow these instructions at home: Alcohol use Do not drink alcohol if: Your health care provider tells you not to drink. You are pregnant, may be pregnant, or are planning to become pregnant. If you drink alcohol: Limit how much you have to: 0-1 drink a day. Know how much alcohol is in your drink. In the [place] one drink equals one 12 oz bottle of beer (355 mL), one 5 oz glass of wine (148 mL), or one 1 oz glass of hard liquor (44 mL). Lifestyle Do not use any products that contain nicotine or tobacco. These products include cigarettes, chewing tobacco, and vaping devices, such as e-cigarettes. If you need help quitting, ask your health care provider. Do not use street drugs. Do not share needles. Ask your health care provider for help if you need support or information about quitting drugs. General instructions Schedule regular health, dental, and eye exams. Stay current with your vaccines. Tell your health care provider if: You often feel depressed. You have ever been abused or do not feel safe at home. Summary Adopting a healthy lifestyle and getting preventive care are important in promoting health and  wellness. Follow your health care provider's instructions about healthy diet, exercising, and getting tested or screened for diseases. Follow your health care provider's instructions on monitoring your cholesterol and blood pressure. This information is not intended to replace advice given to you by your health care provider. Make sure you discuss any questions you have with your health care provider. Document Revised: 04/17/2021 Document Reviewed: 04/17/2021 Elsevier Patient Education  [place].

## 2023-04-24 ENCOUNTER — Ambulatory Visit (INDEPENDENT_AMBULATORY_CARE_PROVIDER_SITE_OTHER): Payer: BC Managed Care – PPO | Admitting: Internal Medicine

## 2023-04-24 VITALS — BP 114/68 | HR 80 | Temp 98.2°F | Ht 65.0 in | Wt 184.0 lb

## 2023-04-24 DIAGNOSIS — Z0000 Encounter for general adult medical examination without abnormal findings: Secondary | ICD-10-CM | POA: Diagnosis not present

## 2023-04-24 DIAGNOSIS — K219 Gastro-esophageal reflux disease without esophagitis: Secondary | ICD-10-CM | POA: Diagnosis not present

## 2023-04-24 DIAGNOSIS — Z7985 Long-term (current) use of injectable non-insulin antidiabetic drugs: Secondary | ICD-10-CM

## 2023-04-24 DIAGNOSIS — G479 Sleep disorder, unspecified: Secondary | ICD-10-CM

## 2023-04-24 DIAGNOSIS — E782 Mixed hyperlipidemia: Secondary | ICD-10-CM | POA: Diagnosis not present

## 2023-04-24 DIAGNOSIS — F411 Generalized anxiety disorder: Secondary | ICD-10-CM

## 2023-04-24 DIAGNOSIS — I10 Essential (primary) hypertension: Secondary | ICD-10-CM

## 2023-04-24 DIAGNOSIS — E669 Obesity, unspecified: Secondary | ICD-10-CM

## 2023-04-24 DIAGNOSIS — E119 Type 2 diabetes mellitus without complications: Secondary | ICD-10-CM | POA: Diagnosis not present

## 2023-04-24 DIAGNOSIS — J4520 Mild intermittent asthma, uncomplicated: Secondary | ICD-10-CM

## 2023-04-24 LAB — CBC WITH DIFFERENTIAL/PLATELET
Basophils Absolute: 0.1 10*3/uL (ref 0.0–0.1)
Basophils Relative: 1.1 % (ref 0.0–3.0)
Eosinophils Absolute: 0.3 10*3/uL (ref 0.0–0.7)
Eosinophils Relative: 3.3 % (ref 0.0–5.0)
HCT: 40.3 % (ref 36.0–46.0)
Hemoglobin: 13.1 g/dL (ref 12.0–15.0)
Lymphocytes Relative: 23.2 % (ref 12.0–46.0)
Lymphs Abs: 2 10*3/uL (ref 0.7–4.0)
MCHC: 32.5 g/dL (ref 30.0–36.0)
MCV: 83.7 fl (ref 78.0–100.0)
Monocytes Absolute: 0.7 10*3/uL (ref 0.1–1.0)
Monocytes Relative: 8.6 % (ref 3.0–12.0)
Neutro Abs: 5.5 10*3/uL (ref 1.4–7.7)
Neutrophils Relative %: 63.8 % (ref 43.0–77.0)
Platelets: 214 10*3/uL (ref 150.0–400.0)
RBC: 4.82 Mil/uL (ref 3.87–5.11)
RDW: 16.4 % — ABNORMAL HIGH (ref 11.5–15.5)
WBC: 8.5 10*3/uL (ref 4.0–10.5)

## 2023-04-24 LAB — LIPID PANEL
Cholesterol: 172 mg/dL (ref 0–200)
HDL: 78.1 mg/dL (ref >39.00)
LDL Cholesterol: 68 mg/dL (ref 0–99)
NonHDL: 94.05
Total CHOL/HDL Ratio: 2
Triglycerides: 132 mg/dL (ref 0.0–149.0)
VLDL: 26.4 mg/dL (ref 0.0–40.0)

## 2023-04-24 LAB — COMPREHENSIVE METABOLIC PANEL
ALT: 20 U/L (ref 0–35)
AST: 21 U/L (ref 0–37)
Albumin: 4.2 g/dL (ref 3.5–5.2)
Alkaline Phosphatase: 71 U/L (ref 39–117)
BUN: 10 mg/dL (ref 6–23)
CO2: 31 mEq/L (ref 19–32)
Calcium: 9.6 mg/dL (ref 8.4–10.5)
Chloride: 102 mEq/L (ref 96–112)
Creatinine, Ser: 0.91 mg/dL (ref 0.40–1.20)
GFR: 68.11 mL/min (ref >60.00)
Glucose, Bld: 82 mg/dL (ref 70–99)
Potassium: 4.1 mEq/L (ref 3.5–5.1)
Sodium: 141 mEq/L (ref 135–145)
Total Bilirubin: 0.3 mg/dL (ref 0.2–1.2)
Total Protein: 7.4 g/dL (ref 6.0–8.3)

## 2023-04-24 LAB — MICROALBUMIN / CREATININE URINE RATIO
Creatinine,U: 132.6 mg/dL
Microalb Creat Ratio: 0.8 mg/g (ref 0.0–30.0)
Microalb, Ur: 1 mg/dL (ref 0.0–1.9)

## 2023-04-24 LAB — HEMOGLOBIN A1C: Hgb A1c MFr Bld: 6.3 % (ref 4.6–6.5)

## 2023-04-24 MED ORDER — TIRZEPATIDE 10 MG/0.5ML ~~LOC~~ SOAJ: 10.0000 mg | SUBCUTANEOUS | 0 refills | Status: DC

## 2023-04-24 NOTE — Assessment & Plan Note (Addendum)
Chronic   Lab Results  Component Value Date   HGBA1C 6.6 (H) 10/22/2022   Sugars  controlled Check A1c, urine microalbumin Continue Mounjaro-increase to 10 mg weekly-discussed that we can always consider going back to the Ozempic which would depend on supply of the medication since this did work better for her, but for now we will try the 10 mg dose of the Mounjaro Stressed regular exercise, diabetic diet

## 2023-04-24 NOTE — Assessment & Plan Note (Signed)
Chronic °Regular exercise and healthy diet encouraged °Check lipid panel  °Continue pravastatin 10 mg daily °

## 2023-04-24 NOTE — Assessment & Plan Note (Signed)
Chronic Mild, intermittent Controlled Continue singular 10 mg nightly, Advair 250-50 twice daily, albuterol as needed 

## 2023-04-24 NOTE — Assessment & Plan Note (Signed)
Chronic GERD controlled Continue omeprazole 40 mg daily 

## 2023-04-24 NOTE — Assessment & Plan Note (Signed)
Chronic Working on weight loss On Mounjaro for diabetes-will increase to 10 mg weekly which will hopefully help more with her weight-has found this less effective than Ozempic She is doing some exercise-May need to increase some of the exercise Continue to eat healthy and work on portions

## 2023-04-24 NOTE — Assessment & Plan Note (Signed)
Chronic ?Controlled, Stable ?Continue amitriptyline 25 mg nightly, buspirone 5 mg 2-3 times daily ?

## 2023-04-24 NOTE — Assessment & Plan Note (Signed)
Chronic Controlled, Stable Continue trazodone 150 mcg daily 

## 2023-04-24 NOTE — Assessment & Plan Note (Signed)
Chronic ?Blood pressure well controlled ?CMP ?Continue lisinopril 10 mg daily ?

## 2023-04-26 DIAGNOSIS — D414 Neoplasm of uncertain behavior of bladder: Secondary | ICD-10-CM | POA: Diagnosis not present

## 2023-05-14 ENCOUNTER — Other Ambulatory Visit: Payer: Self-pay | Admitting: Internal Medicine

## 2023-05-20 DIAGNOSIS — H31091 Other chorioretinal scars, right eye: Secondary | ICD-10-CM | POA: Diagnosis not present

## 2023-05-20 DIAGNOSIS — H43392 Other vitreous opacities, left eye: Secondary | ICD-10-CM | POA: Diagnosis not present

## 2023-05-20 DIAGNOSIS — H33321 Round hole, right eye: Secondary | ICD-10-CM | POA: Diagnosis not present

## 2023-05-21 ENCOUNTER — Other Ambulatory Visit: Payer: Self-pay | Admitting: Internal Medicine

## 2023-05-23 DIAGNOSIS — M654 Radial styloid tenosynovitis [de Quervain]: Secondary | ICD-10-CM | POA: Diagnosis not present

## 2023-05-23 DIAGNOSIS — G5603 Carpal tunnel syndrome, bilateral upper limbs: Secondary | ICD-10-CM | POA: Diagnosis not present

## 2023-05-30 DIAGNOSIS — L570 Actinic keratosis: Secondary | ICD-10-CM | POA: Diagnosis not present

## 2023-05-30 DIAGNOSIS — L2089 Other atopic dermatitis: Secondary | ICD-10-CM | POA: Diagnosis not present

## 2023-05-30 DIAGNOSIS — Z85828 Personal history of other malignant neoplasm of skin: Secondary | ICD-10-CM | POA: Diagnosis not present

## 2023-06-05 DIAGNOSIS — H5319 Other subjective visual disturbances: Secondary | ICD-10-CM | POA: Diagnosis not present

## 2023-06-05 DIAGNOSIS — H53141 Visual discomfort, right eye: Secondary | ICD-10-CM | POA: Diagnosis not present

## 2023-06-05 DIAGNOSIS — H33321 Round hole, right eye: Secondary | ICD-10-CM | POA: Diagnosis not present

## 2023-06-27 DIAGNOSIS — G5601 Carpal tunnel syndrome, right upper limb: Secondary | ICD-10-CM | POA: Diagnosis not present

## 2023-06-27 DIAGNOSIS — M7989 Other specified soft tissue disorders: Secondary | ICD-10-CM | POA: Diagnosis not present

## 2023-06-27 DIAGNOSIS — M654 Radial styloid tenosynovitis [de Quervain]: Secondary | ICD-10-CM | POA: Diagnosis not present

## 2023-07-08 DIAGNOSIS — M654 Radial styloid tenosynovitis [de Quervain]: Secondary | ICD-10-CM | POA: Diagnosis not present

## 2023-07-08 DIAGNOSIS — G5601 Carpal tunnel syndrome, right upper limb: Secondary | ICD-10-CM | POA: Diagnosis not present

## 2023-07-17 ENCOUNTER — Other Ambulatory Visit: Payer: Self-pay | Admitting: Internal Medicine

## 2023-07-22 DIAGNOSIS — G5603 Carpal tunnel syndrome, bilateral upper limbs: Secondary | ICD-10-CM | POA: Diagnosis not present

## 2023-08-02 ENCOUNTER — Other Ambulatory Visit: Payer: Self-pay | Admitting: Internal Medicine

## 2023-08-05 ENCOUNTER — Other Ambulatory Visit: Payer: Self-pay | Admitting: Internal Medicine

## 2023-08-07 DIAGNOSIS — H35413 Lattice degeneration of retina, bilateral: Secondary | ICD-10-CM | POA: Diagnosis not present

## 2023-08-07 DIAGNOSIS — H43813 Vitreous degeneration, bilateral: Secondary | ICD-10-CM | POA: Diagnosis not present

## 2023-08-07 DIAGNOSIS — H31093 Other chorioretinal scars, bilateral: Secondary | ICD-10-CM | POA: Diagnosis not present

## 2023-08-07 DIAGNOSIS — H25813 Combined forms of age-related cataract, bilateral: Secondary | ICD-10-CM | POA: Diagnosis not present

## 2023-08-16 DIAGNOSIS — H31093 Other chorioretinal scars, bilateral: Secondary | ICD-10-CM | POA: Diagnosis not present

## 2023-08-16 DIAGNOSIS — H2513 Age-related nuclear cataract, bilateral: Secondary | ICD-10-CM | POA: Diagnosis not present

## 2023-08-16 DIAGNOSIS — E119 Type 2 diabetes mellitus without complications: Secondary | ICD-10-CM | POA: Diagnosis not present

## 2023-08-16 DIAGNOSIS — H43812 Vitreous degeneration, left eye: Secondary | ICD-10-CM | POA: Diagnosis not present

## 2023-08-17 ENCOUNTER — Other Ambulatory Visit: Payer: Self-pay | Admitting: Internal Medicine

## 2023-08-19 ENCOUNTER — Other Ambulatory Visit: Payer: Self-pay | Admitting: Internal Medicine

## 2023-08-19 DIAGNOSIS — G5603 Carpal tunnel syndrome, bilateral upper limbs: Secondary | ICD-10-CM | POA: Diagnosis not present

## 2023-08-22 ENCOUNTER — Other Ambulatory Visit: Payer: Self-pay | Admitting: Internal Medicine

## 2023-08-26 NOTE — Progress Notes (Unsigned)
Subjective:    Patient ID: Morgan Andrews, female    DOB: [birth year], 62 y.o.   MRN: [id number]      HPI Rochele is here for No chief complaint on file.    Right earache -     Medications and allergies reviewed with patient and updated if appropriate.  Current Outpatient Medications on File Prior to Visit  Medication Sig Dispense Refill   amitriptyline (ELAVIL) 25 MG tablet TAKE 1 TO 3 TABLETS BY MOUTH AT BEDTIME 270 tablet 1   busPIRone (BUSPAR) 5 MG tablet TAKE 1 TABLET BY MOUTH THREE TIMES A DAY 270 tablet 2   estradiol (ESTRACE VAGINAL) 0.1 MG/GM vaginal cream Place 1 Applicatorful vaginally 3 (three) times a week. 42.5 g 12   Fluticasone Propionate, Inhal, 250 MCG/ACT AEPB Inhale 1-2 puffs into the lungs in the morning and at bedtime. 28 each 5   fluticasone-salmeterol (ADVAIR) 250-50 MCG/ACT AEPB Inhale 1 to 2 puffs by mouth into the lungs in the morning and at bedtime. 60 each 4   HYDROcodone-acetaminophen (NORCO/VICODIN) 5-325 MG tablet Take 1-2 tablets by mouth every 6 (six) hours as needed for moderate pain. 30 tablet 0   ipratropium-albuterol (DUONEB) 0.5-2.5 (3) MG/3ML SOLN Take 3 mLs by nebulization as needed. 360 mL 2   levalbuterol (XOPENEX HFA) 45 MCG/ACT inhaler Inhale 1-2 puffs into the lungs every 6 (six) hours as needed for wheezing. 15 g 11   lisinopril (ZESTRIL) 10 MG tablet TAKE 1 TABLET BY MOUTH EVERY DAY 90 tablet 1   montelukast (SINGULAIR) 10 MG tablet TAKE 1 TABLET BY MOUTH EVERY DAY 90 tablet 2   omeprazole (PRILOSEC) 40 MG capsule TAKE 1 CAPSULE (40 MG TOTAL) BY MOUTH DAILY. 90 capsule 2   pravastatin (PRAVACHOL) 10 MG tablet TAKE 1 TABLET BY MOUTH EVERY DAY 90 tablet 2   tirzepatide (MOUNJARO) 10 MG/0.5ML Pen INJECT 10 MG INTO THE SKIN ONE TIME PER WEEK 6 mL 0   tiZANidine (ZANAFLEX) 4 MG tablet Take 1 tablet (4 mg total) by mouth at bedtime. 30 tablet 0   traZODone (DESYREL) 150 MG tablet TAKE 1 TABLET BY MOUTH EVERYDAY AT BEDTIME 90 tablet 2    triamcinolone cream (KENALOG) 0.1 % SMARTSIG:sparingly Topical Twice Daily     No current facility-administered medications on file prior to visit.    Review of Systems     Objective:  There were no vitals filed for this visit. BP Readings from Last 3 Encounters:  04/24/23 114/68  01/11/23 114/78  12/14/22 122/78   Wt Readings from Last 3 Encounters:  04/24/23 184 lb (83.5 kg)  01/11/23 180 lb (81.6 kg)  12/14/22 184 lb (83.5 kg)   There is no height or weight on file to calculate BMI.    Physical Exam         Assessment & Plan:    See Problem List for Assessment and Plan of chronic medical problems.

## 2023-08-27 ENCOUNTER — Encounter: Payer: Self-pay | Admitting: Internal Medicine

## 2023-08-27 ENCOUNTER — Ambulatory Visit (INDEPENDENT_AMBULATORY_CARE_PROVIDER_SITE_OTHER): Payer: BC Managed Care – PPO | Admitting: Internal Medicine

## 2023-08-27 VITALS — BP 128/70 | HR 80 | Temp 98.3°F | Ht 65.0 in | Wt 179.0 lb

## 2023-08-27 DIAGNOSIS — H9201 Otalgia, right ear: Secondary | ICD-10-CM

## 2023-08-27 DIAGNOSIS — I10 Essential (primary) hypertension: Secondary | ICD-10-CM | POA: Diagnosis not present

## 2023-08-27 MED ORDER — AMOXICILLIN 500 MG PO CAPS: 500.0000 mg | ORAL_CAPSULE | Freq: Three times a day (TID) | ORAL | 0 refills | Status: AC

## 2023-08-27 MED ORDER — NEOMYCIN-POLYMYXIN-HC 3.5-10000-1 OT SOLN: 4.0000 [drp] | Freq: Four times a day (QID) | OTIC | 0 refills | Status: DC

## 2023-08-27 NOTE — Assessment & Plan Note (Signed)
Acute Started yesterday Pain is in the ear canal on the upper portion-exam is difficult because of the amount of pain and difficulty visualizing, but symptoms suggestive of cellulitis or mild abscess possibly secondary to trauma versus other Start amoxicillin 500 mg 3 times daily x 5 days and Cortisporin eardrops 4 drops 4 times a day for 5 days She will call if there is no improvement

## 2023-08-27 NOTE — Assessment & Plan Note (Signed)
Chronic Blood pressure well controlled Continue lisinopril 10 mg daily

## 2023-08-27 NOTE — Patient Instructions (Addendum)
       Medications changes include :   cortisporin ear drops and amoxicillin x 5 days      Return if symptoms worsen or fail to improve.

## 2023-08-29 ENCOUNTER — Encounter: Payer: Self-pay | Admitting: Internal Medicine

## 2023-09-02 DIAGNOSIS — H2511 Age-related nuclear cataract, right eye: Secondary | ICD-10-CM | POA: Diagnosis not present

## 2023-09-04 ENCOUNTER — Encounter: Payer: Self-pay | Admitting: Internal Medicine

## 2023-10-03 ENCOUNTER — Other Ambulatory Visit: Payer: Self-pay | Admitting: Medical Genetics

## 2023-10-03 DIAGNOSIS — Z006 Encounter for examination for normal comparison and control in clinical research program: Secondary | ICD-10-CM

## 2023-10-18 DIAGNOSIS — G5602 Carpal tunnel syndrome, left upper limb: Secondary | ICD-10-CM | POA: Diagnosis not present

## 2023-10-24 ENCOUNTER — Encounter: Payer: Self-pay | Admitting: Internal Medicine

## 2023-10-24 NOTE — Patient Instructions (Addendum)
      Blood work was ordered.   The lab is on the first floor.    Medications changes include :   stop trazodone.  Trial of sonata 5 mg at night.  xyzal for allergies, flonase nasal spray      Return in about 6 months (around 04/23/2024) for follow up.

## 2023-10-24 NOTE — Progress Notes (Signed)
Subjective:    Patient ID: Morgan Andrews, female    DOB: [birth year], 62 y.o.   MRN: [id number]     HPI Morgan Andrews is here for follow up of her chronic medical problems.  Since she was here last her husband died unexpectedly.  Overall she feels she is doing okay.  Medications and allergies reviewed with patient and updated if appropriate.  Current Outpatient Medications on File Prior to Visit  Medication Sig Dispense Refill   amitriptyline (ELAVIL) 25 MG tablet TAKE 1 TO 3 TABLETS BY MOUTH AT BEDTIME 270 tablet 1   busPIRone (BUSPAR) 5 MG tablet TAKE 1 TABLET BY MOUTH THREE TIMES A DAY 270 tablet 2   estradiol (ESTRACE VAGINAL) 0.1 MG/GM vaginal cream Place 1 Applicatorful vaginally 3 (three) times a week. 42.5 g 12   Fluticasone Propionate, Inhal, 250 MCG/ACT AEPB Inhale 1-2 puffs into the lungs in the morning and at bedtime. 28 each 5   fluticasone-salmeterol (ADVAIR) 250-50 MCG/ACT AEPB Inhale 1 to 2 puffs by mouth into the lungs in the morning and at bedtime. 60 each 4   ipratropium-albuterol (DUONEB) 0.5-2.5 (3) MG/3ML SOLN Take 3 mLs by nebulization as needed. 360 mL 2   levalbuterol (XOPENEX HFA) 45 MCG/ACT inhaler Inhale 1-2 puffs into the lungs every 6 (six) hours as needed for wheezing. 15 g 11   lisinopril (ZESTRIL) 10 MG tablet TAKE 1 TABLET BY MOUTH EVERY DAY 90 tablet 1   montelukast (SINGULAIR) 10 MG tablet TAKE 1 TABLET BY MOUTH EVERY DAY 90 tablet 2   omeprazole (PRILOSEC) 40 MG capsule TAKE 1 CAPSULE (40 MG TOTAL) BY MOUTH DAILY. 90 capsule 2   pravastatin (PRAVACHOL) 10 MG tablet TAKE 1 TABLET BY MOUTH EVERY DAY 90 tablet 2   tirzepatide (MOUNJARO) 10 MG/0.5ML Pen INJECT 10 MG INTO THE SKIN ONE TIME PER WEEK 6 mL 0   tiZANidine (ZANAFLEX) 4 MG tablet Take 1 tablet (4 mg total) by mouth at bedtime. 30 tablet 0   traZODone (DESYREL) 150 MG tablet TAKE 1 TABLET BY MOUTH EVERYDAY AT BEDTIME 90 tablet 2   triamcinolone cream (KENALOG) 0.1 % SMARTSIG:sparingly Topical  Twice Daily     No current facility-administered medications on file prior to visit.     Review of Systems  Constitutional:  Negative for fever.  HENT:  Positive for congestion, postnasal drip and voice change.   Respiratory:  Negative for cough, shortness of breath and wheezing.   Cardiovascular:  Negative for chest pain, palpitations and leg swelling.  Neurological:  Negative for light-headedness and headaches.       Objective:   Vitals:   10/25/23 1053  BP: 110/78  Pulse: 80  Temp: 98.4 F (36.9 C)  SpO2: 98%   BP Readings from Last 3 Encounters:  10/25/23 110/78  08/27/23 128/70  04/24/23 114/68   Wt Readings from Last 3 Encounters:  10/25/23 172 lb (78 kg)  08/27/23 179 lb (81.2 kg)  04/24/23 184 lb (83.5 kg)   Body mass index is 28.62 kg/m.    Physical Exam Constitutional:      General: She is not in acute distress.    Appearance: Normal appearance.  HENT:     Head: Normocephalic and atraumatic.  Eyes:     Conjunctiva/sclera: Conjunctivae normal.  Cardiovascular:     Rate and Rhythm: Normal rate and regular rhythm.     Heart sounds: Normal heart sounds.  Pulmonary:     Effort: Pulmonary effort is  normal. No respiratory distress.     Breath sounds: Normal breath sounds. No wheezing.  Musculoskeletal:     Cervical back: Neck supple.     Right lower leg: No edema.     Left lower leg: No edema.  Lymphadenopathy:     Cervical: No cervical adenopathy.  Skin:    General: Skin is warm and dry.     Findings: No rash.  Neurological:     Mental Status: She is alert. Mental status is at baseline.  Psychiatric:        Mood and Affect: Mood normal.        Behavior: Behavior normal.        Lab Results  Component Value Date   WBC 8.5 04/24/2023   HGB 13.1 04/24/2023   HCT 40.3 04/24/2023   PLT 214.0 04/24/2023   GLUCOSE 82 04/24/2023   CHOL 172 04/24/2023   TRIG 132.0 04/24/2023   HDL 78.10 04/24/2023   LDLDIRECT 128.6 09/02/2013   LDLCALC 68  04/24/2023   ALT 20 04/24/2023   AST 21 04/24/2023   NA 141 04/24/2023   K 4.1 04/24/2023   CL 102 04/24/2023   CREATININE 0.91 04/24/2023   BUN 10 04/24/2023   CO2 31 04/24/2023   TSH 1.83 04/13/2021   HGBA1C 6.3 04/24/2023   MICROALBUR 1.0 04/24/2023     Assessment & Plan:    See Problem List for Assessment and Plan of chronic medical problems.

## 2023-10-25 ENCOUNTER — Ambulatory Visit (INDEPENDENT_AMBULATORY_CARE_PROVIDER_SITE_OTHER): Payer: BC Managed Care – PPO | Admitting: Internal Medicine

## 2023-10-25 VITALS — BP 110/78 | HR 80 | Temp 98.4°F | Ht 65.0 in | Wt 172.0 lb

## 2023-10-25 DIAGNOSIS — E782 Mixed hyperlipidemia: Secondary | ICD-10-CM | POA: Diagnosis not present

## 2023-10-25 DIAGNOSIS — K219 Gastro-esophageal reflux disease without esophagitis: Secondary | ICD-10-CM

## 2023-10-25 DIAGNOSIS — Z7985 Long-term (current) use of injectable non-insulin antidiabetic drugs: Secondary | ICD-10-CM

## 2023-10-25 DIAGNOSIS — I10 Essential (primary) hypertension: Secondary | ICD-10-CM | POA: Diagnosis not present

## 2023-10-25 DIAGNOSIS — F411 Generalized anxiety disorder: Secondary | ICD-10-CM

## 2023-10-25 DIAGNOSIS — J4520 Mild intermittent asthma, uncomplicated: Secondary | ICD-10-CM | POA: Diagnosis not present

## 2023-10-25 DIAGNOSIS — G479 Sleep disorder, unspecified: Secondary | ICD-10-CM

## 2023-10-25 DIAGNOSIS — E1169 Type 2 diabetes mellitus with other specified complication: Secondary | ICD-10-CM

## 2023-10-25 LAB — LIPID PANEL
Cholesterol: 186 mg/dL (ref 0–200)
HDL: 72.9 mg/dL (ref >39.00)
LDL Cholesterol: 88 mg/dL (ref 0–99)
NonHDL: 112.76
Total CHOL/HDL Ratio: 3
Triglycerides: 122 mg/dL (ref 0.0–149.0)
VLDL: 24.4 mg/dL (ref 0.0–40.0)

## 2023-10-25 LAB — COMPREHENSIVE METABOLIC PANEL
ALT: 19 U/L (ref 0–35)
AST: 25 U/L (ref 0–37)
Albumin: 4.5 g/dL (ref 3.5–5.2)
Alkaline Phosphatase: 69 U/L (ref 39–117)
BUN: 15 mg/dL (ref 6–23)
CO2: 30 meq/L (ref 19–32)
Calcium: 10 mg/dL (ref 8.4–10.5)
Chloride: 104 meq/L (ref 96–112)
Creatinine, Ser: 0.91 mg/dL (ref 0.40–1.20)
GFR: 67.87 mL/min (ref >60.00)
Glucose, Bld: 86 mg/dL (ref 70–99)
Potassium: 4.4 meq/L (ref 3.5–5.1)
Sodium: 140 meq/L (ref 135–145)
Total Bilirubin: 0.3 mg/dL (ref 0.2–1.2)
Total Protein: 7.6 g/dL (ref 6.0–8.3)

## 2023-10-25 LAB — HEMOGLOBIN A1C: Hgb A1c MFr Bld: 6.1 % (ref 4.6–6.5)

## 2023-10-25 MED ORDER — ZALEPLON 5 MG PO CAPS: 5.0000 mg | ORAL_CAPSULE | Freq: Every evening | ORAL | 2 refills | Status: DC | PRN

## 2023-10-25 MED ORDER — LEVOCETIRIZINE DIHYDROCHLORIDE 5 MG PO TABS: 5.0000 mg | ORAL_TABLET | Freq: Every evening | ORAL | 5 refills | Status: DC

## 2023-10-25 MED ORDER — FLUTICASONE PROPIONATE 50 MCG/ACT NA SUSP: 2.0000 | Freq: Every day | NASAL | 6 refills | Status: DC

## 2023-10-25 NOTE — Assessment & Plan Note (Signed)
Chronic GERD controlled Continue omeprazole 40 mg daily 

## 2023-10-25 NOTE — Assessment & Plan Note (Addendum)
Chronic Sleep is not ideal-she has difficulty falling asleep Has been on trazodone for a long time and does not seem to be working any further Stop trazodone Start Sonata 5 mg - 10 mg at night She has been on amitriptyline for many years and that does not make her drowsy so I think it is okay to take with the [facility]

## 2023-10-25 NOTE — Assessment & Plan Note (Signed)
Chronic Mild, intermittent Controlled Continue singular 10 mg nightly, Advair 250-50 twice daily, levalbuterol / Duo neb as needed

## 2023-10-25 NOTE — Assessment & Plan Note (Signed)
Chronic  Lab Results  Component Value Date   HGBA1C 6.3 04/24/2023   Sugars  controlled Check A1c  Continue Mounjaro 10 mg weekly Stressed regular exercise, diabetic diet

## 2023-10-25 NOTE — Assessment & Plan Note (Signed)
Chronic ?Controlled, Stable ?Continue amitriptyline 25 mg nightly, buspirone 5 mg 2-3 times daily ?

## 2023-10-25 NOTE — Assessment & Plan Note (Signed)
Chronic Regular exercise and healthy diet encouraged Check lipid panel  Continue pravastatin 10 mg daily 

## 2023-10-25 NOTE — Assessment & Plan Note (Signed)
Chronic Blood pressure well controlled cmp Continue lisinopril 10 mg daily

## 2023-10-28 DIAGNOSIS — G5602 Carpal tunnel syndrome, left upper limb: Secondary | ICD-10-CM | POA: Diagnosis not present

## 2023-10-29 ENCOUNTER — Other Ambulatory Visit (HOSPITAL_COMMUNITY)
Admission: RE | Admit: 2023-10-29 | Discharge: 2023-10-29 | Disposition: A | Discharge status: Home / Self Care | Payer: BC Managed Care – PPO | Source: Ambulatory Visit | Attending: Oncology | Admitting: Oncology

## 2023-10-29 DIAGNOSIS — Z006 Encounter for examination for normal comparison and control in clinical research program: Secondary | ICD-10-CM | POA: Insufficient documentation

## 2023-11-05 DIAGNOSIS — H2511 Age-related nuclear cataract, right eye: Secondary | ICD-10-CM | POA: Diagnosis not present

## 2023-11-05 DIAGNOSIS — E119 Type 2 diabetes mellitus without complications: Secondary | ICD-10-CM | POA: Diagnosis not present

## 2023-11-11 DIAGNOSIS — H2512 Age-related nuclear cataract, left eye: Secondary | ICD-10-CM | POA: Diagnosis not present

## 2023-11-11 DIAGNOSIS — G5602 Carpal tunnel syndrome, left upper limb: Secondary | ICD-10-CM | POA: Diagnosis not present

## 2023-11-12 LAB — GENECONNECT MOLECULAR SCREEN: Genetic Analysis Overall Interpretation: NEGATIVE

## 2023-11-16 ENCOUNTER — Other Ambulatory Visit: Payer: Self-pay | Admitting: Internal Medicine

## 2023-11-18 ENCOUNTER — Encounter: Payer: Self-pay | Admitting: Internal Medicine

## 2023-11-18 DIAGNOSIS — L918 Other hypertrophic disorders of the skin: Secondary | ICD-10-CM | POA: Diagnosis not present

## 2023-11-18 DIAGNOSIS — L821 Other seborrheic keratosis: Secondary | ICD-10-CM | POA: Diagnosis not present

## 2023-11-18 DIAGNOSIS — Z85828 Personal history of other malignant neoplasm of skin: Secondary | ICD-10-CM | POA: Diagnosis not present

## 2023-11-19 ENCOUNTER — Other Ambulatory Visit: Payer: Self-pay | Admitting: Internal Medicine

## 2023-11-19 DIAGNOSIS — H2512 Age-related nuclear cataract, left eye: Secondary | ICD-10-CM | POA: Diagnosis not present

## 2023-11-26 ENCOUNTER — Encounter: Payer: Self-pay | Admitting: Internal Medicine

## 2023-11-26 LAB — HM DIABETES EYE EXAM

## 2023-11-26 NOTE — Telephone Encounter (Signed)
 Care team updated and letter sent for eye exam notes.

## 2023-12-05 ENCOUNTER — Other Ambulatory Visit: Payer: Self-pay | Admitting: Internal Medicine

## 2023-12-09 DIAGNOSIS — G5602 Carpal tunnel syndrome, left upper limb: Secondary | ICD-10-CM | POA: Diagnosis not present

## 2023-12-10 ENCOUNTER — Other Ambulatory Visit (HOSPITAL_COMMUNITY): Payer: Self-pay

## 2023-12-10 ENCOUNTER — Telehealth: Payer: Self-pay

## 2023-12-10 NOTE — Telephone Encounter (Signed)
PA has been approved for [facility] from 12/10/23-12/09/26.

## 2023-12-10 NOTE — Telephone Encounter (Signed)
 Pharmacy Patient Advocate Encounter   Received notification from [name] that prior authorization for Mounjaro  10MG /0.5ML auto-injectors is required/requested.   Insurance verification completed.   The patient is insured through [facility] ADVANTAGE/RX ADVANCE .   Per test claim: PA required; PA submitted to above mentioned insurance via [name] Key/confirmation #/EOC [id number] Status is pending

## 2023-12-16 ENCOUNTER — Encounter: Payer: Self-pay | Admitting: Internal Medicine

## 2023-12-16 ENCOUNTER — Other Ambulatory Visit: Payer: Self-pay

## 2023-12-16 MED ORDER — FLUTICASONE PROPIONATE (INHAL) 250 MCG/ACT IN AEPB: 1.0000 | INHALATION_SPRAY | Freq: Two times a day (BID) | RESPIRATORY_TRACT | 5 refills | Status: DC

## 2023-12-16 MED ORDER — MONTELUKAST SODIUM 10 MG PO TABS: 10.0000 mg | ORAL_TABLET | Freq: Every day | ORAL | 2 refills | Status: DC

## 2023-12-16 MED ORDER — PRAVASTATIN SODIUM 10 MG PO TABS: 10.0000 mg | ORAL_TABLET | Freq: Every day | ORAL | 2 refills | Status: DC

## 2023-12-16 MED ORDER — BUSPIRONE HCL 5 MG PO TABS: 5.0000 mg | ORAL_TABLET | Freq: Three times a day (TID) | ORAL | 2 refills | Status: DC

## 2023-12-16 MED ORDER — FLUTICASONE PROPIONATE 50 MCG/ACT NA SUSP: 2.0000 | Freq: Every day | NASAL | 6 refills | Status: DC

## 2023-12-16 MED ORDER — IPRATROPIUM-ALBUTEROL 0.5-2.5 (3) MG/3ML IN SOLN: 3.0000 mL | RESPIRATORY_TRACT | 2 refills | Status: DC | PRN

## 2023-12-16 MED ORDER — LEVALBUTEROL TARTRATE 45 MCG/ACT IN AERO: 1.0000 | INHALATION_SPRAY | Freq: Four times a day (QID) | RESPIRATORY_TRACT | 11 refills | Status: AC | PRN

## 2023-12-16 MED ORDER — ZALEPLON 5 MG PO CAPS: 5.0000 mg | ORAL_CAPSULE | Freq: Every evening | ORAL | 5 refills | Status: DC | PRN

## 2023-12-16 MED ORDER — ESTRADIOL 0.1 MG/GM VA CREA: 1.0000 | TOPICAL_CREAM | VAGINAL | 12 refills | Status: DC

## 2023-12-16 MED ORDER — AMITRIPTYLINE HCL 25 MG PO TABS: ORAL_TABLET | ORAL | 1 refills | Status: DC

## 2023-12-16 MED ORDER — LISINOPRIL 10 MG PO TABS: 10.0000 mg | ORAL_TABLET | Freq: Every day | ORAL | 1 refills | Status: DC

## 2023-12-16 MED ORDER — MOUNJARO 10 MG/0.5ML ~~LOC~~ SOAJ: 10.0000 mg | SUBCUTANEOUS | 1 refills | Status: DC

## 2023-12-16 MED ORDER — FLUTICASONE-SALMETEROL 250-50 MCG/ACT IN AEPB: INHALATION_SPRAY | RESPIRATORY_TRACT | 3 refills | Status: DC

## 2023-12-16 MED ORDER — LEVOCETIRIZINE DIHYDROCHLORIDE 5 MG PO TABS: 5.0000 mg | ORAL_TABLET | Freq: Every evening | ORAL | 5 refills | Status: DC

## 2023-12-16 MED ORDER — OMEPRAZOLE 40 MG PO CPDR: 40.0000 mg | DELAYED_RELEASE_CAPSULE | Freq: Every day | ORAL | 2 refills | Status: DC

## 2023-12-17 ENCOUNTER — Other Ambulatory Visit: Payer: Self-pay | Admitting: Internal Medicine

## 2023-12-18 ENCOUNTER — Encounter: Payer: Self-pay | Admitting: Internal Medicine

## 2023-12-20 ENCOUNTER — Telehealth: Payer: Self-pay | Admitting: Internal Medicine

## 2023-12-20 MED ORDER — IPRATROPIUM-ALBUTEROL 0.5-2.5 (3) MG/3ML IN SOLN: 3.0000 mL | RESPIRATORY_TRACT | 2 refills | Status: AC | PRN

## 2023-12-20 NOTE — Telephone Encounter (Signed)
 Copied from CRM [id number]. Topic: Clinical - Medication Question >> Dec 20, 2023  8:28 AM Deaijah H wrote: Reason for CRM: [facility] called in wanting to know maximum daily dose for ipratropium-albuterol  (DUONEB) 0.5-2.5 (3) MG/3ML SOLN / callback # [phone]

## 2023-12-20 NOTE — Telephone Encounter (Signed)
 Max 6 doses /day

## 2023-12-20 NOTE — Telephone Encounter (Signed)
 Called [employer] today and spoke with [place].  Info updated.

## 2023-12-22 ENCOUNTER — Telehealth: Payer: BC Managed Care – PPO | Admitting: Family

## 2023-12-22 DIAGNOSIS — J029 Acute pharyngitis, unspecified: Secondary | ICD-10-CM

## 2023-12-22 MED ORDER — AMOXICILLIN 500 MG PO CAPS: 500.0000 mg | ORAL_CAPSULE | Freq: Two times a day (BID) | ORAL | 0 refills | Status: AC

## 2023-12-22 NOTE — Progress Notes (Signed)
 Virtual Visit Consent   Morgan Andrews, you are scheduled for a virtual visit with a [facility] provider today. Just as with appointments in the office, your consent must be obtained to participate. Your consent will be active for this visit and any virtual visit you may have with one of our providers in the next 365 days. If you have a MyChart account, a copy of this consent can be sent to you electronically.  As this is a virtual visit, video technology does not allow for your provider to perform a traditional examination. This may limit your provider's ability to fully assess your condition. If your provider identifies any concerns that need to be evaluated in person or the need to arrange testing (such as labs, EKG, etc.), we will make arrangements to do so. Although advances in technology are sophisticated, we cannot ensure that it will always work on either your end or our end. If the connection with a video visit is poor, the visit may have to be switched to a telephone visit. With either a video or telephone visit, we are not always able to ensure that we have a secure connection.  By engaging in this virtual visit, you consent to the provision of healthcare and authorize for your insurance to be billed (if applicable) for the services provided during this visit. Depending on your insurance coverage, you may receive a charge related to this service.  I need to obtain your verbal consent now. Are you willing to proceed with your visit today? Morgan Andrews has provided verbal consent on 12/22/2023 for a virtual visit (video or telephone). Bari Learn, FNP  Date: 12/22/2023 7:10 PM  Virtual Visit via Video Note   I, Bari Learn, connected with  Morgan Andrews  ([id number], 23-Nov-1961) on 12/22/23 at  7:30 PM EST by a video-enabled telemedicine application and verified that I am speaking with the correct person using two identifiers.  Location: Patient: Virtual Visit Location Patient:  Home Provider: Virtual Visit Location Provider: Home Office   I discussed the limitations of evaluation and management by telemedicine and the availability of in person appointments. The patient expressed understanding and agreed to proceed.    History of Present Illness: Morgan Andrews is a 63 y.o. who identifies as a female who was assigned female at birth, and is being seen today for sore throat that started this morning.  HPI: Sore Throat  This is a new problem. The current episode started today. The problem has been gradually worsening. The pain is worse on the right side. The maximum temperature recorded prior to her arrival was 101 - 101.9 F. The pain is at a severity of 8/10. The pain is moderate. Associated symptoms include headaches, swollen glands and trouble swallowing. Pertinent negatives include no congestion or coughing. Ear pain: ear pressure.She has had no exposure to strep. She has tried acetaminophen  and NSAIDs for the symptoms.    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   Diabetes (HCC) 09/05/2013   Hyperlipidemia 09/02/2013   Cervical disc disease 07/26/2013   Chest pain 07/10/2012   Anxiety disorder 04/18/2012  HTN (hypertension) 04/18/2012   NONSPECIFIC ABNORMAL ELECTROCARDIOGRAM 04/04/2010   Allergic rhinitis 01/25/2009   Asthma 01/25/2009   ECZEMA 01/25/2009   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 01/25/2009    Allergies:  Allergies  Allergen Reactions   Clemastine Fumarate     REACTION: ASTHMA ATTACK( Note: Tavist D caused asthma flare from excessive drying)   Lyrica [Pregabalin]      Pedal edema   Medications:  Current Outpatient Medications:    amoxicillin  (AMOXIL ) 500 MG capsule, Take 1 capsule (500 mg total) by mouth 2 (two) times daily for 10 days., Disp: 20 capsule, Rfl: 0   amitriptyline  (ELAVIL ) 25 MG tablet, Take 1 to 3 tablets by mouth at bedtime, Disp: 270 tablet, Rfl: 1   busPIRone  (BUSPAR ) 5 MG tablet, Take 1 tablet (5 mg total) by mouth 3 (three) times daily., Disp: 270 tablet, Rfl: 2   estradiol  (ESTRACE  VAGINAL) 0.1 MG/GM vaginal cream, Place 1 Applicatorful vaginally 3 (three) times a week., Disp: 42.5 g, Rfl: 12   fluticasone  (FLONASE ) 50 MCG/ACT nasal spray, Place 2 sprays into both nostrils daily., Disp: 16 g, Rfl: 6   Fluticasone  Propionate, Inhal, 250 MCG/ACT AEPB, Inhale 1-2 puffs into the lungs in the morning and at bedtime., Disp: 28 each, Rfl: 5   fluticasone -salmeterol (ADVAIR) 250-50 MCG/ACT AEPB, Inhale 1 puff by mouth into the lungs twice daily, Disp: 60 each, Rfl: 3   ipratropium-albuterol  (DUONEB) 0.5-2.5 (3) MG/3ML SOLN, Take 3 mLs by nebulization as needed. Max 6 doses per day, Disp: 360 mL, Rfl: 2   levalbuterol  (XOPENEX  HFA) 45 MCG/ACT inhaler, Inhale 1-2 puffs into the lungs every 6 (six) hours as needed for wheezing., Disp: 15 g, Rfl: 11   levocetirizine (XYZAL ) 5 MG tablet, Take 1 tablet (5 mg total) by mouth every evening., Disp: 30 tablet, Rfl: 5   lisinopril  (ZESTRIL ) 10 MG tablet, Take 1 tablet (10 mg total) by mouth daily., Disp: 90 tablet, Rfl: 1   montelukast  (SINGULAIR ) 10 MG tablet, Take 1 tablet (10 mg total) by mouth daily., Disp: 90 tablet, Rfl: 2   omeprazole  (PRILOSEC) 40 MG capsule, Take 1 capsule (40 mg total) by mouth daily., Disp: 90 capsule, Rfl: 2   pravastatin  (PRAVACHOL ) 10 MG tablet, Take 1 tablet (10 mg total) by mouth daily., Disp: 90 tablet, Rfl: 2   tirzepatide  (MOUNJARO ) 10 MG/0.5ML Pen, Inject 10 mg into the skin once a week., Disp: 6 mL, Rfl: 1   tiZANidine  (ZANAFLEX ) 4 MG tablet, Take 1 tablet (4 mg total) by  mouth at bedtime., Disp: 30 tablet, Rfl: 0   triamcinolone cream (KENALOG) 0.1 %, SMARTSIG:sparingly Topical Twice Daily, Disp: , Rfl:    zaleplon  (SONATA ) 5 MG capsule, Take 1 capsule (5 mg total) by mouth at bedtime as needed for sleep., Disp: 30 capsule, Rfl: 5  Observations/Objective: Patient is well-developed, well-nourished in no acute distress.  Resting comfortably  at home.  Head is normocephalic, atraumatic.  No labored breathing.  Speech is clear and coherent with logical content.  Patient is alert and oriented at baseline.  Throat erythemas, white patches on right side  Assessment and Plan: 1. Acute pharyngitis, unspecified etiology (Primary) - amoxicillin  (AMOXIL ) 500 MG capsule; Take 1 capsule (500 mg total) by mouth 2 (two) times daily for 10 days.  Dispense: 20 capsule; Refill: 0  - Take meds as prescribed - Use a cool mist humidifier  -Use saline nose sprays frequently -Force fluids -For any cough or congestion  Use plain Mucinex- regular strength  or max strength is fine -For fever or aces or pains- take tylenol  or ibuprofen. -Throat lozenges if help -New toothbrush in 3 days -Follow up if symptoms worsen or do not improve   Follow Up Instructions: I discussed the assessment and treatment plan with the patient. The patient was provided an opportunity to ask questions and all were answered. The patient agreed with the plan and demonstrated an understanding of the instructions.  A copy of instructions were sent to the patient via MyChart unless otherwise noted below.    The patient was advised to call back or seek an in-person evaluation if the symptoms worsen or if the condition fails to improve as anticipated.    Bari Learn, FNP

## 2023-12-23 ENCOUNTER — Encounter: Payer: Self-pay | Admitting: Internal Medicine

## 2023-12-23 ENCOUNTER — Other Ambulatory Visit (HOSPITAL_COMMUNITY): Payer: Self-pay

## 2023-12-25 ENCOUNTER — Other Ambulatory Visit: Payer: Self-pay | Admitting: Internal Medicine

## 2023-12-25 ENCOUNTER — Telehealth: Payer: Self-pay

## 2023-12-25 MED ORDER — PRAVASTATIN SODIUM 10 MG PO TABS: 10.0000 mg | ORAL_TABLET | Freq: Every day | ORAL | 2 refills | Status: DC

## 2023-12-25 MED ORDER — LEVOCETIRIZINE DIHYDROCHLORIDE 5 MG PO TABS: 5.0000 mg | ORAL_TABLET | Freq: Every evening | ORAL | 5 refills | Status: DC

## 2023-12-25 MED ORDER — OMEPRAZOLE 40 MG PO CPDR: 40.0000 mg | DELAYED_RELEASE_CAPSULE | Freq: Every day | ORAL | 2 refills | Status: DC

## 2023-12-25 MED ORDER — FLUTICASONE PROPIONATE (INHAL) 250 MCG/ACT IN AEPB: 1.0000 | INHALATION_SPRAY | Freq: Two times a day (BID) | RESPIRATORY_TRACT | 5 refills | Status: DC

## 2023-12-25 MED ORDER — LISINOPRIL 10 MG PO TABS: 10.0000 mg | ORAL_TABLET | Freq: Every day | ORAL | 1 refills | Status: DC

## 2023-12-25 MED ORDER — ESTRADIOL 0.1 MG/GM VA CREA: 1.0000 | TOPICAL_CREAM | VAGINAL | 12 refills | Status: AC

## 2023-12-25 MED ORDER — ZALEPLON 5 MG PO CAPS: 5.0000 mg | ORAL_CAPSULE | Freq: Every evening | ORAL | 5 refills | Status: DC | PRN

## 2023-12-25 MED ORDER — BUSPIRONE HCL 5 MG PO TABS: 5.0000 mg | ORAL_TABLET | Freq: Three times a day (TID) | ORAL | 2 refills | Status: AC

## 2023-12-25 MED ORDER — AMITRIPTYLINE HCL 25 MG PO TABS: ORAL_TABLET | ORAL | 1 refills | Status: DC

## 2023-12-25 MED ORDER — MONTELUKAST SODIUM 10 MG PO TABS: 10.0000 mg | ORAL_TABLET | Freq: Every day | ORAL | 2 refills | Status: DC

## 2023-12-25 MED ORDER — FLUTICASONE PROPIONATE 50 MCG/ACT NA SUSP: 2.0000 | Freq: Every day | NASAL | 6 refills | Status: DC

## 2023-12-25 NOTE — Telephone Encounter (Signed)
 Copied from CRM [id number]. Topic: Clinical - Medication Refill >> Dec 25, 2023  9:36 AM Evie Hoff wrote: Most Recent Primary Care Visit:  Provider: BURNS, Beckey Bourgeois  Department: [facility]  Visit Type: OFFICE VISIT  Date: 10/25/2023  Medication: fluticasone  (FLONASE ) 50 MCG/ACT nasal spray Fluticasone  Propionate, Inhal, 250 MCG/ACT AEPB estradiol  (ESTRACE  VAGINAL) 0.1 MG/GM vaginal cream zaleplon  (SONATA ) 5 MG capsule amitriptyline  (ELAVIL ) 25 MG tablet busPIRone  (BUSPAR ) 5 MG tablet levocetirizine (XYZAL ) 5 MG tablet lisinopril  (ZESTRIL ) 10 MG tablet pravastatin  (PRAVACHOL ) 10 MG tablet montelukast  (SINGULAIR ) 10 MG tablet omeprazole  (PRILOSEC) 40 MG capsule  Has the patient contacted their pharmacy? Yes pharmacy is calling for patient  (Agent: If no, request that the patient contact the pharmacy for the refill. If patient does not wish to contact the pharmacy document the reason why and proceed with request.) (Agent: If yes, when and what did the pharmacy advise?)  Is this the correct pharmacy for this prescription? Yes If no, delete pharmacy and type the correct one.  This is the patient's preferred pharmacy:  [facility] [address]  [place], [place], [address] Phone: [phone] fax: [phone]  [id number] [place], [place] - [address] [place] [address] Phone: [phone] Fax: [phone]  [facility] - [place], [place] - [address] [place] [address] Phone: [phone] Fax: [phone]  [facility] [address] [place] [address] Phone: [phone] Fax: [phone]  [facility] [address] [place]), [place] - [address] [place] [address] Phone: [phone] Fax: [phone]  [facility] [address], [place] - [address] [place] [address] Phone: [phone] Fax: [phone]  [facility] [place] [place] - [address] [place] [address] Phone: [phone] Fax: [phone]   Has the prescription been filled recently? No new pharmacy  Is the patient out of the medication? No   Has the patient been seen for an appointment in the last year OR does the patient have an upcoming appointment? Yes  Can we respond through MyChart? Yes  Agent: Please be advised that Rx refills may take up to 3 business days. We ask that you follow-up with your pharmacy.

## 2023-12-27 ENCOUNTER — Telehealth: Payer: Self-pay

## 2023-12-27 ENCOUNTER — Other Ambulatory Visit (HOSPITAL_COMMUNITY): Payer: Self-pay

## 2023-12-27 ENCOUNTER — Other Ambulatory Visit: Payer: Self-pay

## 2023-12-27 MED ORDER — MOUNJARO 10 MG/0.5ML ~~LOC~~ SOAJ: 10.0000 mg | SUBCUTANEOUS | 10 refills | Status: DC

## 2023-12-27 MED ORDER — MOUNJARO 10 MG/0.5ML ~~LOC~~ SOAJ: 10.0000 mg | SUBCUTANEOUS | 5 refills | Status: DC

## 2023-12-27 NOTE — Telephone Encounter (Signed)
Pharmacy Patient Advocate Encounter   Received notification from Pt Calls Messages that prior authorization for Mounjaro 10mg /0.41ml is required/requested.   Insurance verification completed.   The patient is insured through [facility] .   Per test claim: Prior authorization expires on 12/09/26. Next available fill date is 01/07/2024. Last filled 12/17/2023 at [facility] [phone])

## 2023-12-27 NOTE — Telephone Encounter (Signed)
Patient notified via mychart and script refaxed today for 30 day supply only.

## 2023-12-30 ENCOUNTER — Other Ambulatory Visit: Payer: Self-pay | Admitting: Internal Medicine

## 2024-01-01 ENCOUNTER — Telehealth: Payer: Self-pay | Admitting: Internal Medicine

## 2024-01-01 NOTE — Telephone Encounter (Signed)
Copied from CRM [id number]. Topic: Clinical - Medication Question >> Jan 01, 2024 10:18 AM Fredrich Romans wrote: Reason for CRM: [facility] pill [facility] called with a question regarding   Fluticasone Propionate, Inhal, 250 MCG/ACT AEPB  Said that  This is generic ,she would like to know if it is replacing or is it an addition to the advair.  Provider number#:[id number]

## 2024-01-02 ENCOUNTER — Encounter: Payer: Self-pay | Admitting: Internal Medicine

## 2024-01-02 NOTE — Telephone Encounter (Signed)
Mychart message sent to patient to clarify.

## 2024-01-09 ENCOUNTER — Encounter: Payer: Self-pay | Admitting: Internal Medicine

## 2024-01-12 MED ORDER — OZEMPIC (0.25 OR 0.5 MG/DOSE) 2 MG/3ML ~~LOC~~ SOPN: PEN_INJECTOR | SUBCUTANEOUS | 0 refills | Status: DC

## 2024-01-13 ENCOUNTER — Other Ambulatory Visit: Payer: Self-pay

## 2024-01-13 MED ORDER — FLUTICASONE-SALMETEROL 250-50 MCG/ACT IN AEPB: INHALATION_SPRAY | RESPIRATORY_TRACT | 3 refills | Status: DC

## 2024-01-14 MED ORDER — ZALEPLON 5 MG PO CAPS: 5.0000 mg | ORAL_CAPSULE | Freq: Every evening | ORAL | 5 refills | Status: DC | PRN

## 2024-01-14 NOTE — Addendum Note (Signed)
Addended by: Pincus Sanes on: 01/14/2024 05:16 AM   Modules accepted: Orders

## 2024-01-31 ENCOUNTER — Other Ambulatory Visit: Payer: Self-pay | Admitting: Internal Medicine

## 2024-02-11 NOTE — Addendum Note (Signed)
 Addended by: Pincus Sanes on: 02/11/2024 05:46 AM   Modules accepted: Orders

## 2024-02-12 ENCOUNTER — Other Ambulatory Visit (HOSPITAL_COMMUNITY): Payer: Self-pay

## 2024-02-12 ENCOUNTER — Other Ambulatory Visit: Payer: Self-pay | Admitting: Internal Medicine

## 2024-02-12 ENCOUNTER — Telehealth: Payer: Self-pay

## 2024-02-12 MED ORDER — TIRZEPATIDE 10 MG/0.5ML ~~LOC~~ SOAJ: 10.0000 mg | SUBCUTANEOUS | 2 refills | Status: DC

## 2024-02-12 NOTE — Addendum Note (Signed)
 Addended by: Pincus Sanes on: 02/12/2024 08:56 AM   Modules accepted: Orders

## 2024-02-12 NOTE — Telephone Encounter (Signed)
 Pharmacy Patient Advocate Encounter   Received notification from RX Request Messages that prior authorization for Mounjaro 10mg /0.40ml is required/requested.   Insurance verification completed.   The patient is insured through  [employer]  .   Per test claim: PA required; PA submitted to above mentioned insurance via [name] Key/confirmation #/EOC [id number] Status is pending

## 2024-02-13 NOTE — Telephone Encounter (Signed)
 Pharmacy Patient Advocate Encounter  Received notification from  [employer]  that Prior Authorization for Mounjaro 10mg /0.52ml has been APPROVED from 02/12/24 to 02/11/25   PA #/Case ID/Reference #: [id number]

## 2024-02-14 ENCOUNTER — Encounter: Payer: Self-pay | Admitting: Internal Medicine

## 2024-02-17 NOTE — Telephone Encounter (Signed)
 Called and informed patient that she had been approved for the mounjaro. Will also be updating her insurance as she no longer has [employer].

## 2024-02-17 NOTE — Telephone Encounter (Signed)
 Pt requesting alternative for mounjaro due to insurance

## 2024-03-03 ENCOUNTER — Other Ambulatory Visit: Payer: Self-pay | Admitting: Internal Medicine

## 2024-03-07 ENCOUNTER — Encounter: Payer: Self-pay | Admitting: Internal Medicine

## 2024-03-09 MED ORDER — AMITRIPTYLINE HCL 25 MG PO TABS: ORAL_TABLET | ORAL | 1 refills | Status: DC

## 2024-03-09 NOTE — Addendum Note (Signed)
 Addended by: Pincus Sanes on: 03/09/2024 07:04 PM   Modules accepted: Orders

## 2024-04-02 ENCOUNTER — Other Ambulatory Visit: Payer: Self-pay | Admitting: Internal Medicine

## 2024-04-06 ENCOUNTER — Telehealth: Payer: Self-pay

## 2024-04-06 ENCOUNTER — Other Ambulatory Visit (HOSPITAL_COMMUNITY): Payer: Self-pay

## 2024-04-06 NOTE — Telephone Encounter (Signed)
 Pharmacy Patient Advocate Encounter   Received notification from RX Request Messages that prior authorization for Mounjaro  10mg /0.45ml is required/requested.   Insurance verification completed.   The patient is insured through  [employer]  .   Per test claim: Refill too soon. PA is not needed at this time. Medication was filled 03/21/24. Next eligible fill date is 04/11/24.

## 2024-04-23 ENCOUNTER — Encounter: Payer: Self-pay | Admitting: Internal Medicine

## 2024-04-23 NOTE — Patient Instructions (Addendum)
      Blood work was ordered.       Medications changes include :   None    A referral was ordered and someone will call you to schedule an appointment.     Return in about 6 months (around 10/25/2024) for Physical Exam.

## 2024-04-23 NOTE — Progress Notes (Unsigned)
 Subjective:    Patient ID: Morgan Andrews, female    DOB: [birth year], 63 y.o.   MRN: [id number]     HPI Morgan Andrews is here for follow up of her chronic medical problems.  She is exercising regularly.     Sleep is still not great - hard time falling asleep.  The medication may help a little bit, but still has difficulty.  Feels like she is eating good overall.  She does drink a lot of water and is trying to get a good amount of protein in.  Medications and allergies reviewed with patient and updated if appropriate.  Current Outpatient Medications on File Prior to Visit  Medication Sig Dispense Refill   amitriptyline  (ELAVIL ) 25 MG tablet Take 1 to 3 tablets by mouth at bedtime 270 tablet 1   busPIRone  (BUSPAR ) 5 MG tablet Take 1 tablet (5 mg total) by mouth 3 (three) times daily. 270 tablet 2   estradiol  (ESTRACE  VAGINAL) 0.1 MG/GM vaginal cream Place 1 Applicatorful vaginally 3 (three) times a week. 42.5 g 12   fluticasone  (FLONASE ) 50 MCG/ACT nasal spray Place 2 sprays into both nostrils daily. 16 g 6   fluticasone -salmeterol (ADVAIR) 250-50 MCG/ACT AEPB Inhale 1 puff by mouth into the lungs twice daily 60 each 3   ipratropium-albuterol  (DUONEB) 0.5-2.5 (3) MG/3ML SOLN Take 3 mLs by nebulization as needed. Max 6 doses per day 360 mL 2   levalbuterol  (XOPENEX  HFA) 45 MCG/ACT inhaler Inhale 1-2 puffs into the lungs every 6 (six) hours as needed for wheezing. 15 g 11   levocetirizine (XYZAL ) 5 MG tablet Take 1 tablet (5 mg total) by mouth every evening. 30 tablet 5   lisinopril  (ZESTRIL ) 10 MG tablet Take 1 tablet by mouth daily. 90 tablet 0   montelukast  (SINGULAIR ) 10 MG tablet Take 1 tablet (10 mg total) by mouth daily. 90 tablet 2   omeprazole  (PRILOSEC) 40 MG capsule Take 1 capsule (40 mg total) by mouth daily. 90 capsule 2   pravastatin  (PRAVACHOL ) 10 MG tablet Take 1 tablet (10 mg total) by mouth daily. 90 tablet 2   tiZANidine  (ZANAFLEX ) 4 MG tablet Take 1 tablet (4 mg  total) by mouth at bedtime. 30 tablet 0   triamcinolone cream (KENALOG) 0.1 % SMARTSIG:sparingly Topical Twice Daily     zaleplon  (SONATA ) 5 MG capsule Take 1 capsule (5 mg total) by mouth at bedtime as needed for sleep. 30 capsule 5   No current facility-administered medications on file prior to visit.     Review of Systems  Constitutional:  Negative for fever.  Respiratory:  Negative for cough, shortness of breath and wheezing.   Cardiovascular:  Negative for chest pain (tightness - anxiety), palpitations and leg swelling.  Neurological:  Negative for light-headedness and headaches.       Objective:   Vitals:   04/24/24 1018  BP: 118/70  Pulse: 82  Temp: 98.7 F (37.1 C)  SpO2: 97%   BP Readings from Last 3 Encounters:  04/24/24 118/70  10/25/23 110/78  08/27/23 128/70   Wt Readings from Last 3 Encounters:  04/24/24 167 lb (75.8 kg)  10/25/23 172 lb (78 kg)  08/27/23 179 lb (81.2 kg)   Body mass index is 27.79 kg/m.    Physical Exam Constitutional:      General: She is not in acute distress.    Appearance: Normal appearance.  HENT:     Head: Normocephalic and atraumatic.  Eyes:  Conjunctiva/sclera: Conjunctivae normal.  Cardiovascular:     Rate and Rhythm: Normal rate and regular rhythm.     Heart sounds: Normal heart sounds.  Pulmonary:     Effort: Pulmonary effort is normal. No respiratory distress.     Breath sounds: Normal breath sounds. No wheezing.  Musculoskeletal:     Cervical back: Neck supple.     Right lower leg: No edema.     Left lower leg: No edema.  Lymphadenopathy:     Cervical: No cervical adenopathy.  Skin:    General: Skin is warm and dry.     Findings: No rash.  Neurological:     Mental Status: She is alert. Mental status is at baseline.  Psychiatric:        Mood and Affect: Mood normal.        Behavior: Behavior normal.       Diabetic Foot Exam - Simple   Simple Foot Form Diabetic Foot exam was performed with the  following findings: Yes 04/24/2024 10:46 AM  Visual Inspection No deformities, no ulcerations, no other skin breakdown bilaterally: Yes Sensation Testing Intact to touch and monofilament testing bilaterally: Yes Pulse Check Posterior Tibialis and Dorsalis pulse intact bilaterally: Yes Comments      Lab Results  Component Value Date   WBC 8.5 04/24/2023   HGB 13.1 04/24/2023   HCT 40.3 04/24/2023   PLT 214.0 04/24/2023   GLUCOSE 86 10/25/2023   CHOL 186 10/25/2023   TRIG 122.0 10/25/2023   HDL 72.90 10/25/2023   LDLDIRECT 128.6 09/02/2013   LDLCALC 88 10/25/2023   ALT 19 10/25/2023   AST 25 10/25/2023   NA 140 10/25/2023   K 4.4 10/25/2023   CL 104 10/25/2023   CREATININE 0.91 10/25/2023   BUN 15 10/25/2023   CO2 30 10/25/2023   TSH 1.83 04/13/2021   HGBA1C 6.1 10/25/2023   MICROALBUR 1.0 04/24/2023     Assessment & Plan:    See Problem List for Assessment and Plan of chronic medical problems.

## 2024-04-24 ENCOUNTER — Ambulatory Visit (INDEPENDENT_AMBULATORY_CARE_PROVIDER_SITE_OTHER): Payer: BC Managed Care – PPO | Admitting: Internal Medicine

## 2024-04-24 ENCOUNTER — Ambulatory Visit: Payer: Self-pay | Admitting: Internal Medicine

## 2024-04-24 VITALS — BP 118/70 | HR 82 | Temp 98.7°F | Ht 65.0 in | Wt 167.0 lb

## 2024-04-24 DIAGNOSIS — F411 Generalized anxiety disorder: Secondary | ICD-10-CM | POA: Diagnosis not present

## 2024-04-24 DIAGNOSIS — J4520 Mild intermittent asthma, uncomplicated: Secondary | ICD-10-CM

## 2024-04-24 DIAGNOSIS — E1169 Type 2 diabetes mellitus with other specified complication: Secondary | ICD-10-CM | POA: Diagnosis not present

## 2024-04-24 DIAGNOSIS — G479 Sleep disorder, unspecified: Secondary | ICD-10-CM

## 2024-04-24 DIAGNOSIS — E782 Mixed hyperlipidemia: Secondary | ICD-10-CM

## 2024-04-24 DIAGNOSIS — K219 Gastro-esophageal reflux disease without esophagitis: Secondary | ICD-10-CM

## 2024-04-24 DIAGNOSIS — I10 Essential (primary) hypertension: Secondary | ICD-10-CM | POA: Diagnosis not present

## 2024-04-24 DIAGNOSIS — Z7985 Long-term (current) use of injectable non-insulin antidiabetic drugs: Secondary | ICD-10-CM

## 2024-04-24 DIAGNOSIS — Z1211 Encounter for screening for malignant neoplasm of colon: Secondary | ICD-10-CM

## 2024-04-24 LAB — LIPID PANEL
Cholesterol: 185 mg/dL (ref 0–200)
HDL: 76.7 mg/dL (ref >39.00)
LDL Cholesterol: 88 mg/dL (ref 0–99)
NonHDL: 108.72
Total CHOL/HDL Ratio: 2
Triglycerides: 104 mg/dL (ref 0.0–149.0)
VLDL: 20.8 mg/dL (ref 0.0–40.0)

## 2024-04-24 LAB — MICROALBUMIN / CREATININE URINE RATIO
Creatinine,U: 111.3 mg/dL
Microalb Creat Ratio: 9.8 mg/g (ref 0.0–30.0)
Microalb, Ur: 1.1 mg/dL (ref 0.0–1.9)

## 2024-04-24 LAB — COMPREHENSIVE METABOLIC PANEL WITH GFR
ALT: 21 U/L (ref 0–35)
AST: 23 U/L (ref 0–37)
Albumin: 4.5 g/dL (ref 3.5–5.2)
Alkaline Phosphatase: 72 U/L (ref 39–117)
BUN: 15 mg/dL (ref 6–23)
CO2: 33 meq/L — ABNORMAL HIGH (ref 19–32)
Calcium: 9.7 mg/dL (ref 8.4–10.5)
Chloride: 101 meq/L (ref 96–112)
Creatinine, Ser: 0.77 mg/dL (ref 0.40–1.20)
GFR: 82.65 mL/min (ref >60.00)
Glucose, Bld: 90 mg/dL (ref 70–99)
Potassium: 4.5 meq/L (ref 3.5–5.1)
Sodium: 141 meq/L (ref 135–145)
Total Bilirubin: 0.3 mg/dL (ref 0.2–1.2)
Total Protein: 7.7 g/dL (ref 6.0–8.3)

## 2024-04-24 LAB — HEMOGLOBIN A1C: Hgb A1c MFr Bld: 6.1 % (ref 4.6–6.5)

## 2024-04-24 MED ORDER — TIRZEPATIDE 12.5 MG/0.5ML ~~LOC~~ SOAJ: 12.5000 mg | SUBCUTANEOUS | 1 refills | Status: DC

## 2024-04-24 NOTE — Assessment & Plan Note (Signed)
 Chronic Sleep is not ideal-she has difficulty falling asleep Trazodone  not effective, already on amitriptyline  Continue Sonata  5 mg nightly as needed She has been on amitriptyline  for many years and that does not make her drowsy so I think it is okay to take with the Sonata 

## 2024-04-24 NOTE — Assessment & Plan Note (Addendum)
 Chronic  Lab Results  Component Value Date   HGBA1C 6.1 10/25/2023   Sugars  controlled Check A1c, urine albumin/cr ratio Continue Mounjaro -will increase to 12.5 mg weekly Stressed regular exercise, diabetic diet

## 2024-04-24 NOTE — Assessment & Plan Note (Signed)
Chronic ?Controlled, Stable ?Continue amitriptyline 25 mg nightly, buspirone 5 mg 2-3 times daily ?

## 2024-04-24 NOTE — Assessment & Plan Note (Signed)
 Chronic Mild, intermittent Controlled Continue singular 10 mg nightly, Advair 250-50 twice daily, levalbuterol / Duo neb as needed

## 2024-05-02 ENCOUNTER — Other Ambulatory Visit: Payer: Self-pay | Admitting: Internal Medicine

## 2024-05-13 LAB — COLOGUARD: COLOGUARD: NEGATIVE

## 2024-06-01 ENCOUNTER — Other Ambulatory Visit: Payer: Self-pay | Admitting: Internal Medicine

## 2024-06-15 ENCOUNTER — Telehealth: Payer: Self-pay

## 2024-06-15 ENCOUNTER — Other Ambulatory Visit (HOSPITAL_COMMUNITY): Payer: Self-pay

## 2024-06-15 NOTE — Telephone Encounter (Signed)
 Pharmacy Patient Advocate Encounter   Received notification from [name] that prior authorization for Mounjaro  12.5 is required/requested.   Insurance verification completed.   The patient is insured through [facility] .   Per test claim: PA required; PA submitted to above mentioned insurance via [name] Key/confirmation #/EOC [id number] Status is pending

## 2024-06-18 NOTE — Telephone Encounter (Signed)
 Pharmacy Patient Advocate Encounter  Received notification from [facility] that Prior Authorization for Mounjaro  has been APPROVED from 06/15/24 to 02/11/25   PA #/Case ID/Reference #: [id number]

## 2024-07-01 ENCOUNTER — Other Ambulatory Visit: Payer: Self-pay | Admitting: Internal Medicine

## 2024-07-22 ENCOUNTER — Other Ambulatory Visit: Payer: Self-pay | Admitting: Internal Medicine

## 2024-07-31 ENCOUNTER — Other Ambulatory Visit: Payer: Self-pay | Admitting: Internal Medicine

## 2024-08-13 ENCOUNTER — Other Ambulatory Visit: Payer: Self-pay | Admitting: Internal Medicine

## 2024-08-25 ENCOUNTER — Other Ambulatory Visit: Payer: Self-pay | Admitting: Internal Medicine

## 2024-09-07 ENCOUNTER — Encounter: Payer: Self-pay | Admitting: Internal Medicine

## 2024-09-10 ENCOUNTER — Encounter: Payer: Self-pay | Admitting: Internal Medicine

## 2024-10-03 ENCOUNTER — Other Ambulatory Visit: Payer: Self-pay | Admitting: Internal Medicine

## 2024-10-28 ENCOUNTER — Encounter: Admitting: Internal Medicine

## 2024-11-04 ENCOUNTER — Encounter: Payer: Self-pay | Admitting: Internal Medicine

## 2024-11-06 MED ORDER — FLUTICASONE-SALMETEROL 250-50 MCG/ACT IN AEPB: INHALATION_SPRAY | RESPIRATORY_TRACT | 0 refills | Status: DC

## 2024-11-06 MED ORDER — ZALEPLON 5 MG PO CAPS: ORAL_CAPSULE | ORAL | 0 refills | Status: DC

## 2024-11-06 MED ORDER — LEVOCETIRIZINE DIHYDROCHLORIDE 5 MG PO TABS: 5.0000 mg | ORAL_TABLET | Freq: Every evening | ORAL | 4 refills | Status: AC

## 2024-11-06 MED ORDER — OZEMPIC (0.25 OR 0.5 MG/DOSE) 2 MG/3ML ~~LOC~~ SOPN: PEN_INJECTOR | SUBCUTANEOUS | 0 refills | Status: AC

## 2024-11-08 ENCOUNTER — Other Ambulatory Visit: Payer: Self-pay | Admitting: Internal Medicine

## 2024-11-09 ENCOUNTER — Telehealth: Payer: Self-pay | Admitting: Pharmacy Technician

## 2024-11-09 ENCOUNTER — Other Ambulatory Visit (HOSPITAL_COMMUNITY): Payer: Self-pay

## 2024-11-09 NOTE — Telephone Encounter (Signed)
 Pharmacy Patient Advocate Encounter  Received notification from [employer] that Prior Authorization for Ozempic  (0.25 or 0.5 MG/DOSE) 2MG /3ML pen-injectors  has been APPROVED from 11/09/24 to 11/09/25   PA #/Case ID/Reference #: [id number]

## 2024-11-09 NOTE — Telephone Encounter (Signed)
 Pharmacy Patient Advocate Encounter   Received notification from [name] that prior authorization for Ozempic  (0.25 or 0.5 MG/DOSE) 2MG /3ML pen-injectors  is required/requested.   Insurance verification completed.   The patient is insured through [employer].   Per test claim: PA required; PA submitted to above mentioned insurance via Latent Key/confirmation #/EOC [id number] Status is pending

## 2024-12-04 ENCOUNTER — Other Ambulatory Visit: Payer: Self-pay | Admitting: Internal Medicine

## 2024-12-07 ENCOUNTER — Other Ambulatory Visit: Payer: Self-pay | Admitting: Internal Medicine
